# Patient Record
Sex: Male | Born: 1970 | Race: White | Hispanic: No | Smoking: Never smoker
Health system: Southern US, Community
[De-identification: ages and names within clinical notes are randomized; demographics above are authoritative.]

## PROBLEM LIST (undated history)

## (undated) DIAGNOSIS — F419 Anxiety disorder, unspecified: Secondary | ICD-10-CM

## (undated) DIAGNOSIS — I4891 Unspecified atrial fibrillation: Secondary | ICD-10-CM

## (undated) DIAGNOSIS — I10 Essential (primary) hypertension: Secondary | ICD-10-CM

## (undated) HISTORY — DX: Anxiety disorder, unspecified: F41.9

## (undated) HISTORY — PX: SHOULDER SURGERY: SHX246

---

## 1998-05-04 ENCOUNTER — Encounter: Payer: Self-pay | Admitting: Emergency Medicine

## 1998-05-04 ENCOUNTER — Emergency Department (HOSPITAL_COMMUNITY): Admission: EM | Admit: 1998-05-04 | Discharge: 1998-05-04 | Payer: Self-pay | Admitting: Emergency Medicine

## 1998-05-13 ENCOUNTER — Ambulatory Visit (HOSPITAL_COMMUNITY): Admission: RE | Admit: 1998-05-13 | Discharge: 1998-05-13 | Payer: Self-pay | Admitting: Orthopedic Surgery

## 1998-05-14 ENCOUNTER — Encounter: Payer: Self-pay | Admitting: Orthopedic Surgery

## 1998-07-05 ENCOUNTER — Encounter: Admission: RE | Admit: 1998-07-05 | Discharge: 1998-07-20 | Payer: Self-pay | Admitting: Orthopedic Surgery

## 1998-08-16 ENCOUNTER — Encounter: Admission: RE | Admit: 1998-08-16 | Discharge: 1998-10-18 | Payer: Self-pay | Admitting: Anesthesiology

## 1998-09-23 ENCOUNTER — Encounter: Admission: RE | Admit: 1998-09-23 | Discharge: 1998-10-11 | Payer: Self-pay | Admitting: Anesthesiology

## 1999-05-02 ENCOUNTER — Ambulatory Visit: Admission: RE | Admit: 1999-05-02 | Discharge: 1999-05-02 | Payer: Self-pay | Admitting: Occupational Medicine

## 1999-05-02 ENCOUNTER — Encounter: Payer: Self-pay | Admitting: Occupational Medicine

## 2000-12-24 ENCOUNTER — Encounter: Payer: Self-pay | Admitting: Emergency Medicine

## 2000-12-24 ENCOUNTER — Emergency Department (HOSPITAL_COMMUNITY): Admission: EM | Admit: 2000-12-24 | Discharge: 2000-12-24 | Payer: Self-pay | Admitting: Emergency Medicine

## 2001-12-04 ENCOUNTER — Encounter: Payer: Self-pay | Admitting: *Deleted

## 2001-12-04 ENCOUNTER — Emergency Department (HOSPITAL_COMMUNITY): Admission: EM | Admit: 2001-12-04 | Discharge: 2001-12-04 | Payer: Self-pay | Admitting: *Deleted

## 2003-11-16 ENCOUNTER — Encounter: Admission: RE | Admit: 2003-11-16 | Discharge: 2003-11-16 | Payer: Self-pay | Admitting: Family Medicine

## 2005-02-12 ENCOUNTER — Ambulatory Visit: Payer: Self-pay | Admitting: Internal Medicine

## 2010-02-03 ENCOUNTER — Encounter: Payer: Self-pay | Admitting: Gastroenterology

## 2010-03-04 ENCOUNTER — Encounter: Payer: Self-pay | Admitting: Gastroenterology

## 2010-03-06 ENCOUNTER — Encounter: Payer: Self-pay | Admitting: Gastroenterology

## 2010-03-09 ENCOUNTER — Encounter: Payer: Self-pay | Admitting: Gastroenterology

## 2010-04-18 DIAGNOSIS — F411 Generalized anxiety disorder: Secondary | ICD-10-CM | POA: Insufficient documentation

## 2010-04-18 DIAGNOSIS — R1013 Epigastric pain: Secondary | ICD-10-CM

## 2010-04-18 DIAGNOSIS — I10 Essential (primary) hypertension: Secondary | ICD-10-CM | POA: Insufficient documentation

## 2010-04-18 DIAGNOSIS — M25579 Pain in unspecified ankle and joints of unspecified foot: Secondary | ICD-10-CM

## 2010-04-25 ENCOUNTER — Ambulatory Visit: Payer: Self-pay | Admitting: Gastroenterology

## 2010-04-25 DIAGNOSIS — R1012 Left upper quadrant pain: Secondary | ICD-10-CM | POA: Insufficient documentation

## 2010-05-03 ENCOUNTER — Encounter (INDEPENDENT_AMBULATORY_CARE_PROVIDER_SITE_OTHER): Payer: Self-pay | Admitting: *Deleted

## 2010-05-03 ENCOUNTER — Ambulatory Visit: Payer: Self-pay | Admitting: Gastroenterology

## 2010-05-10 ENCOUNTER — Telehealth: Payer: Self-pay | Admitting: Gastroenterology

## 2010-05-29 ENCOUNTER — Encounter: Payer: Self-pay | Admitting: Gastroenterology

## 2010-08-06 ENCOUNTER — Encounter: Payer: Self-pay | Admitting: Family Medicine

## 2010-08-15 NOTE — Assessment & Plan Note (Signed)
Summary: ABD PAIN FOR SEVERAL MONTHS/YF   History of Present Illness Visit Type: Initial Consult Primary GI MD: Melvia Heaps MD [facility] Primary Provider: Louanna Raw, MD Requesting Provider: Louanna Raw, MD Chief Complaint: Patient c/o years LUQ abdominal discomfort which is now worsening. Pain is frequent and can last for days. He also c/o nausea but no vomiting. He does have occasional heartburn. History of Present Illness:   [name] is a 40 year old male referred at the request of Dr. Earlene Plater for evaluation of abdominal pain.  For years he has been complaining of intermittent left upper quadrant pain consisting of spontaneous pain in the subcostal area.  It is described as a sickening pain.  He has taken various PPIs without improvement.  He denies pyrosis.  Pain may interfere with falling asleep although he does not awaken from pain.   He is on no gastric irritants including nonsteroidals.   GI Review of Systems    Reports abdominal pain, belching, bloating, chest pain, heartburn, and  nausea.     Location of  Abdominal pain: RUQ.    Denies acid reflux, dysphagia with liquids, dysphagia with solids, loss of appetite, vomiting, vomiting blood, weight loss, and  weight gain.      Reports change in bowel habits and  diarrhea.     Denies anal fissure, black tarry stools, constipation, diverticulosis, fecal incontinence, heme positive stool, hemorrhoids, irritable bowel syndrome, jaundice, light color stool, liver problems, rectal bleeding, and  rectal pain. Preventive Screening-Counseling & Management  Alcohol-Tobacco     Smoking Status: never  Caffeine-Diet-Exercise     Does Patient Exercise: no      Drug Use:  no.      Current Medications (verified): 1)  Alprazolam 0.5 Mg Tabs (Alprazolam) .... Take 1 Tablet By Mouth Once Daily 2)  Vicodin 5-500 Mg Tabs (Hydrocodone-Acetaminophen) .... Take 1 Tablet By Mouth Once A Day 3)  Atenolol 25 Mg Tabs (Atenolol) .... Take 1 Tablet By  Mouth Once A Day  Allergies (verified): No Known Drug Allergies  Past History:  Past Medical History: Hypertension Anxiety  Past Surgical History: unremarkable  Family History: No FH of Colon Cancer: Father-Lung Cancer  Social History: Patient has never smoked.  Alcohol Use - no Illicit Drug Use - no Patient does not get regular exercise.  Smoking Status:  never Drug Use:  no Does Patient Exercise:  no  Review of Systems       The patient complains of allergy/sinus, anxiety-new, and shortness of breath.  The patient denies anemia, arthritis/joint pain, back pain, blood in urine, breast changes/lumps, change in vision, confusion, cough, coughing up blood, depression-new, fainting, fatigue, fever, headaches-new, hearing problems, heart murmur, heart rhythm changes, itching, menstrual pain, muscle pains/cramps, night sweats, nosebleeds, pregnancy symptoms, skin rash, sleeping problems, sore throat, swelling of feet/legs, swollen lymph glands, thirst - excessive , urination - excessive , urination changes/pain, urine leakage, vision changes, and voice change.         All other systems were reviewed and were negative   Vital Signs:  Patient profile:   40 year old male Height:      72 inches Weight:      273.50 pounds BMI:     37.23 BSA:     2.44 Pulse rate:   76 / minute Pulse rhythm:   regular BP sitting:   132 / 98  (right arm)  Vitals Entered By: Lamona Curl CMA Duncan Dull) (April 25, 2010 3:25 PM)  Physical Exam  Additional Exam:  On physical exam he is a well-developed male  skin: anicteric HEENT: normocephalic; PEERLA; no nasal or pharyngeal abnormalities neck: supple nodes: no cervical lymphadenopathy chest: clear to ausculatation and percussion heart: no murmurs, gallops, or rubs abd: soft, nontender; BS normoactive; no abdominal masses, tenderness, organomegaly rectal: deferred ext: no cynanosis, clubbing, edema skeletal: no deformities neuro:  oriented x 3; no focal abnormalities    Impression & Recommendations:  Problem # 1:  EPIGASTRIC PAIN (ICD-789.06) Symptoms could be due to ulcer or nonulcer dyspepsia.  Abdominal wall pain is also a consideration in view of the longevity of symptoms.  Absence of response to PPI therapy mitigates against overt peptic ulcer disease.  Recommendations #1 trial hyomax 0.375 mg b.i.d. p.r.n. #2 upper endoscopy  Risks, alternatives, and complications of the procedure, including bleeding, perforation, and possible need for surgery, were explained to the patient.  Patient's questions were answered.  Other Orders: EGD (EGD)  Patient Instructions: 1)  Copy sent to : Louanna Raw, MD 2)  Your EGD is scheduled for 05/03/2010 at 3:30pm 3)  The medication list was reviewed and reconciled.  All changed / newly prescribed medications were explained.  A complete medication list was provided to the patient / caregiver. Prescriptions: HYOMAX-SR 0.375 MG XR12H-TAB (HYOSCYAMINE SULFATE) takes one tab twice a day as needed for abdominal pain  #20 x 1   Entered and Authorized by:   Louis Meckel MD   Signed by:   Louis Meckel MD on 04/25/2010   Method used:   Electronically to        [facility]* (retail)       [address] [place], [place]  [address]       Ph: [phone]       Fax: [phone]   RxID:   [id number]

## 2010-08-15 NOTE — Progress Notes (Signed)
  Phone Note Call from Patient   Caller: Spouse Summary of Call: On call note. Pt is having a dry mouth and mild dizziness with hyoscyamine bid which was recently started. Advised to DC the medication or try 1/2 tablet two times a day instead of a full tablet. Contact Dr. Arlyce Dice for additional recommendations tomorrow. Initial call taken by: Meryl Dare MD Clementeen Graham,  May 10, 2010 8:05 PM

## 2010-08-15 NOTE — Letter (Signed)
Summary: [facility]   Imported By: Sherian Rein 05/03/2010 07:59:03  _____________________________________________________________________  External Attachment:    Type:   Image     Comment:   External Document

## 2010-08-15 NOTE — Letter (Signed)
Summary: Results Letter  [facility]  [address] [place], [place] [address]   Phone: [phone]  Fax: [phone]        April 25, 2010 MRN: [id number]    Joshua Simpson [address] [place], [place]  [address]    Dear Joshua Simpson,  It is my pleasure to have treated you recently as a new patient in my office. I appreciate your confidence and the opportunity to participate in your care.  Since I do have a busy inpatient endoscopy schedule and office schedule, my office hours vary weekly. I am, however, available for emergency calls everyday through my office. If I am not available for an urgent office appointment, another one of our gastroenterologist will be able to assist you.  My well-trained staff are prepared to help you at all times. For emergencies after office hours, a physician from our Gastroenterology section is always available through my 24 hour answering service  Once again I welcome you as a new patient and I look forward to a happy and healthy relationship            Sincerely,  Louis Meckel MD  This letter has been electronically signed by your physician.  Appended Document: Results Letter LETTER MAILED

## 2010-08-15 NOTE — Miscellaneous (Signed)
  Clinical Lists Changes  Medications: Removed medication of HYOMAX-SR 0.375 MG XR12H-TAB (HYOSCYAMINE SULFATE) take 1 tab two times a day as needed abdominal pain Added new medication of HYOMAX-SR 0.375 MG XR12H-TAB (HYOSCYAMINE SULFATE) take 1 tab two times a day as needed abdominal pain - Signed Rx of HYOMAX-SR 0.375 MG XR12H-TAB (HYOSCYAMINE SULFATE) take 1 tab two times a day as needed abdominal pain;  #15 x 1;  Signed;  Entered by: Louis Meckel MD;  Authorized by: Louis Meckel MD;  Method used: Print then Give to Patient    Prescriptions: HYOMAX-SR 0.375 MG XR12H-TAB (HYOSCYAMINE SULFATE) take 1 tab two times a day as needed abdominal pain  #15 x 1   Entered and Authorized by:   Louis Meckel MD   Signed by:   Louis Meckel MD on 05/03/2010   Method used:   Print then Give to Patient   RxID:   [id number]

## 2010-08-15 NOTE — Miscellaneous (Signed)
  Clinical Lists Changes Pt's pharmacy information is incorrect.  He has not taken hyomax.  Will represcribe .

## 2010-08-15 NOTE — Procedures (Signed)
Summary: Upper Endoscopy  Patient: Joshua Simpson Note: All result statuses are Final unless otherwise noted.  Tests: (1) Upper Endoscopy (EGD)   EGD Upper Endoscopy       DONE     [facility]     [address]     [place], [place]  [address]           ENDOSCOPY PROCEDURE REPORT           PATIENT:  Joshua Simpson, Joshua  MR#:  [id number]     BIRTHDATE:  [birth year], 39 yrs. old  GENDER:  male           ENDOSCOPIST:  Barbette Hair. Arlyce Dice, MD     Referred by:           PROCEDURE DATE:  05/03/2010     PROCEDURE:  EGD, diagnostic     ASA CLASS:  Class II     INDICATIONS:  abdominal pain           MEDICATIONS:   Fentanyl 100 mcg IV, Versed 10 mg IV, Benadryl 50     mg IV, glycopyrrolate (Robinal) 0.2 mg IV, 0.6cc simethancone 0.6     cc PO     TOPICAL ANESTHETIC:  Exactacain Spray           DESCRIPTION OF PROCEDURE:   After the risks benefits and     alternatives of the procedure were thoroughly explained, informed     consent was obtained.  The LB [id number]-H180 [id number] endoscope was     introduced through the mouth and advanced to the third portion of     the duodenum, without limitations.  The instrument was slowly     withdrawn as the mucosa was fully examined.     <<PROCEDUREIMAGES>>           normal otherwise (see image1, image4, image5, image6, image7, and     image8).    Retroflexed views revealed no abnormalities.    The     scope was then withdrawn from the patient and the procedure     completed.           COMPLICATIONS:  None           ENDOSCOPIC IMPRESSION:     1) Normal     RECOMMENDATIONS:Trial of hyomax; if no improvement, NSAIDS           REPEAT EXAM:  No           ______________________________     Barbette Hair. Arlyce Dice, MD           CC:  Louanna Raw, MD           n.     Rosalie Doctor:   Barbette Hair. Kamy Poinsett at 05/03/2010 04:13 PM           Pellow, Joshua, [id number]  Note: An exclamation mark (!) indicates a result that was not dispersed into the flowsheet. Document  Creation Date: 05/03/2010 4:13 PM _______________________________________________________________________  (1) Order result status: Final Collection or observation date-time: 05/03/2010 16:07 Requested date-time:  Receipt date-time:  Reported date-time:  Referring Physician:   Ordering Physician: Melvia Heaps [phone]) Specimen Source:  Source: Launa Grill Order Number: [id number] Lab site:

## 2010-08-15 NOTE — Letter (Signed)
Summary: [facility]  [facility]   Imported By: Sherian Rein 05/03/2010 08:02:07  _____________________________________________________________________  External Attachment:    Type:   Image     Comment:   External Document

## 2010-08-15 NOTE — Letter (Signed)
Summary: EGD Instructions  [facility]  [address] [place], [place] [address]   Phone: [phone]  Fax: [phone]       Joshua Simpson    [birth year]    MRN: [id number]       Procedure Day /Date:WEDNESDAY 05/03/2010     Arrival Time: 2:30PM     Procedure Time:3:30PM     Location of Procedure:                    X  [facility] (4th Floor)   PREPARATION FOR ENDOSCOPY   On10/19/2011 THE DAY OF THE PROCEDURE:  1.   No solid foods, milk or milk products are allowed after midnight the night before your procedure.  2.   Do not drink anything colored red or purple.  Avoid juices with pulp.  No orange juice.  3.  You may drink clear liquids until1:30PM, which is 2 hours before your procedure.                                                                                                CLEAR LIQUIDS INCLUDE: Water Jello Ice Popsicles Tea (sugar ok, no milk/cream) Powdered fruit flavored drinks Coffee (sugar ok, no milk/cream) Gatorade Juice: apple, white grape, white cranberry  Lemonade Clear bullion, consomm, broth Carbonated beverages (any kind) Strained chicken noodle soup Hard Candy   MEDICATION INSTRUCTIONS  Unless otherwise instructed, you should take regular prescription medications with a small sip of water as early as possible the morning of your procedure.           OTHER INSTRUCTIONS  You will need a responsible adult at least 39 years of age to accompany you and drive you home.   This person must remain in the waiting room during your procedure.  Wear loose fitting clothing that is easily removed.  Leave jewelry and other valuables at home.  However, you may wish to bring a book to read or an iPod/MP3 player to listen to music as you wait for your procedure to start.  Remove all body piercing jewelry and leave at home.  Total time from sign-in until discharge is approximately 2-3 hours.  You should go home directly  after your procedure and rest.  You can resume normal activities the day after your procedure.  The day of your procedure you should not:   Drive   Make legal decisions   Operate machinery   Drink alcohol   Return to work  You will receive specific instructions about eating, activities and medications before you leave.    The above instructions have been reviewed and explained to me by   _______________________    I fully understand and can verbalize these instructions _____________________________ Date _________

## 2010-08-15 NOTE — Miscellaneous (Signed)
  Clinical Lists Changes Pharmacy information is incorrect.  Will write for new prescription that will be printed.

## 2010-08-15 NOTE — Miscellaneous (Signed)
  Clinical Lists Changes  Medications: Removed medication of HYOMAX-SR 0.375 MG XR12H-TAB (HYOSCYAMINE SULFATE) takes one tab twice a day as needed for abdominal pain Added new medication of HYOMAX-SR 0.375 MG XR12H-TAB (HYOSCYAMINE SULFATE) take 1 tab two times a day as needed abdominal pain - Signed Rx of HYOMAX-SR 0.375 MG XR12H-TAB (HYOSCYAMINE SULFATE) take 1 tab two times a day as needed abdominal pain;  #15 x 1;  Signed;  Entered by: Karl Bales RN;  Authorized by: Karl Bales RN;  Method used: Print then Give to Patient    Prescriptions: HYOMAX-SR 0.375 MG XR12H-TAB (HYOSCYAMINE SULFATE) take 1 tab two times a day as needed abdominal pain  #15 x 1   Entered and Authorized by:   Karl Bales RN   Signed by:   Karl Bales RN on 05/03/2010   Method used:   Print then Give to Patient   RxID:   [id number]

## 2010-08-15 NOTE — Letter (Signed)
Summary: New Patient letter  [facility]  [address] [place], [place] [address]   Phone: [phone]  Fax: [phone]       03/09/2010 MRN: [id number]  Joshua Simpson [address] [place], [place]  [address]  Dear Joshua Simpson,  Welcome to the Gastroenterology Division at [facility].    You are scheduled to see Dr.  Barbette Hair. Joshua Simpson on 04-25-10 at 3:30pm on the 3rd floor at [facility], [address]  We ask that you try to arrive at our office 15 minutes prior to your appointment time to allow for check-in.  We would like you to complete the enclosed self-administered evaluation form prior to your visit and bring it with you on the day of your appointment.  We will review it with you.  Also, please bring a complete list of all your medications or, if you prefer, bring the medication bottles and we will list them.  Please bring your insurance card so that we may make a copy of it.  If your insurance requires a referral to see a specialist, please bring your referral form from your primary care physician.  Co-payments are due at the time of your visit and may be paid by cash, check or credit card.     Your office visit will consist of a consult with your physician (includes a physical exam), any laboratory testing he/she may order, scheduling of any necessary diagnostic testing (e.g. x-ray, ultrasound, CT-scan), and scheduling of a procedure (e.g. Endoscopy, Colonoscopy) if required.  Please allow enough time on your schedule to allow for any/all of these possibilities.    If you cannot keep your appointment, please call [phone] to cancel or reschedule prior to your appointment date.  This allows [place] the opportunity to schedule an appointment for another patient in need of care.  If you do not cancel or reschedule by 5 p.m. the business day prior to your appointment date, you will be charged a $50.00 late cancellation/no-show fee.    Thank you for  choosing [facility] for your medical needs.  We appreciate the opportunity to care for you.  Please visit [place] at our website  to learn more about our practice.                     Sincerely,                                                             The Gastroenterology Division

## 2010-10-18 ENCOUNTER — Emergency Department (HOSPITAL_COMMUNITY)
Admission: EM | Admit: 2010-10-18 | Discharge: 2010-10-18 | Disposition: A | Discharge status: Home / Self Care | Payer: BC Managed Care – PPO | Attending: Family Medicine | Admitting: Family Medicine

## 2010-10-18 ENCOUNTER — Emergency Department (HOSPITAL_COMMUNITY): Payer: BC Managed Care – PPO

## 2010-10-18 DIAGNOSIS — R0602 Shortness of breath: Secondary | ICD-10-CM | POA: Insufficient documentation

## 2010-10-18 DIAGNOSIS — R5381 Other malaise: Secondary | ICD-10-CM | POA: Insufficient documentation

## 2010-10-18 DIAGNOSIS — I10 Essential (primary) hypertension: Secondary | ICD-10-CM | POA: Insufficient documentation

## 2010-10-18 DIAGNOSIS — M25519 Pain in unspecified shoulder: Secondary | ICD-10-CM | POA: Insufficient documentation

## 2010-10-18 DIAGNOSIS — R0789 Other chest pain: Secondary | ICD-10-CM | POA: Insufficient documentation

## 2010-10-18 DIAGNOSIS — R61 Generalized hyperhidrosis: Secondary | ICD-10-CM | POA: Insufficient documentation

## 2010-10-18 LAB — POCT CARDIAC MARKERS
CKMB, poc: 1 ng/mL (ref 1.0–8.0)
CKMB, poc: <1 ng/mL — ABNORMAL LOW (ref 1.0–8.0)
Myoglobin, poc: 65.5 ng/mL (ref 12–200)
Myoglobin, poc: 64.3 ng/mL (ref 12–200)
Troponin i, poc: <0.05 ng/mL (ref 0.00–0.09)

## 2010-10-18 LAB — CBC
HCT: 47 % (ref 39.0–52.0)
Hemoglobin: 17.2 g/dL — ABNORMAL HIGH (ref 13.0–17.0)
MCV: 91.1 fL (ref 78.0–100.0)
RBC: 5.16 MIL/uL (ref 4.22–5.81)
RDW: 11.8 % (ref 11.5–15.5)
WBC: 5.1 10*3/uL (ref 4.0–10.5)

## 2010-10-18 LAB — BASIC METABOLIC PANEL
CO2: 26 mEq/L (ref 19–32)
Chloride: 106 mEq/L (ref 96–112)
Glucose, Bld: 121 mg/dL — ABNORMAL HIGH (ref 70–99)
Potassium: 3.9 mEq/L (ref 3.5–5.1)
Sodium: 139 mEq/L (ref 135–145)

## 2010-10-18 LAB — DIFFERENTIAL
Eosinophils Relative: 4 % (ref 0–5)
Lymphocytes Relative: 26 % (ref 12–46)
Lymphs Abs: 1.3 10*3/uL (ref 0.7–4.0)
Neutro Abs: 3.1 10*3/uL (ref 1.7–7.7)

## 2014-01-31 ENCOUNTER — Emergency Department (HOSPITAL_COMMUNITY)
Admission: EM | Admit: 2014-01-31 | Discharge: 2014-01-31 | Disposition: A | Discharge status: Home / Self Care | Payer: BC Managed Care – PPO | Attending: Emergency Medicine | Admitting: Emergency Medicine

## 2014-01-31 ENCOUNTER — Encounter (HOSPITAL_COMMUNITY): Payer: Self-pay | Admitting: Emergency Medicine

## 2014-01-31 DIAGNOSIS — T22139A Burn of first degree of unspecified upper arm, initial encounter: Secondary | ICD-10-CM | POA: Insufficient documentation

## 2014-01-31 DIAGNOSIS — Z79899 Other long term (current) drug therapy: Secondary | ICD-10-CM | POA: Insufficient documentation

## 2014-01-31 DIAGNOSIS — I10 Essential (primary) hypertension: Secondary | ICD-10-CM | POA: Insufficient documentation

## 2014-01-31 DIAGNOSIS — X19XXXA Contact with other heat and hot substances, initial encounter: Secondary | ICD-10-CM | POA: Insufficient documentation

## 2014-01-31 DIAGNOSIS — T300 Burn of unspecified body region, unspecified degree: Secondary | ICD-10-CM

## 2014-01-31 DIAGNOSIS — Y929 Unspecified place or not applicable: Secondary | ICD-10-CM | POA: Insufficient documentation

## 2014-01-31 DIAGNOSIS — T2010XA Burn of first degree of head, face, and neck, unspecified site, initial encounter: Secondary | ICD-10-CM | POA: Insufficient documentation

## 2014-01-31 DIAGNOSIS — Y9389 Activity, other specified: Secondary | ICD-10-CM | POA: Insufficient documentation

## 2014-01-31 HISTORY — DX: Essential (primary) hypertension: I10

## 2014-01-31 MED ORDER — OXYCODONE-ACETAMINOPHEN 5-325 MG PO TABS: 1.0000 | ORAL_TABLET | ORAL | Status: DC | PRN

## 2014-01-31 MED ORDER — ONDANSETRON HCL 4 MG/2ML IJ SOLN
4.0000 mg | Freq: Once | INTRAMUSCULAR | Status: AC
  Administered 2014-01-31: 4 mg via INTRAVENOUS
  Filled 2014-01-31: qty 2

## 2014-01-31 MED ORDER — HYDROMORPHONE HCL PF 1 MG/ML IJ SOLN
1.0000 mg | Freq: Once | INTRAMUSCULAR | Status: AC
  Administered 2014-01-31: 1 mg via INTRAVENOUS
  Filled 2014-01-31: qty 1

## 2014-01-31 MED ORDER — KETOROLAC TROMETHAMINE 30 MG/ML IJ SOLN
30.0000 mg | Freq: Once | INTRAMUSCULAR | Status: AC
  Administered 2014-01-31: 30 mg via INTRAVENOUS
  Filled 2014-01-31: qty 1

## 2014-01-31 NOTE — ED Notes (Addendum)
Pt reports he is supposed to take blood pressure medication but does not take it every day and has not took it today.  MD Pollina made aware.
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
## 2014-01-31 NOTE — Discharge Instructions (Signed)
Burn Care Your skin is a natural barrier to infection. It is the largest organ of your body. Burns damage this natural protection. To help prevent infection, it is very important to follow your caregiver's instructions in the care of your burn. Burns are classified as:  First degree. There is only redness of the skin (erythema). No scarring is expected.  Second degree. There is blistering of the skin. Scarring may occur with deeper burns.  Third degree. All layers of the skin are injured, and scarring is expected. HOME CARE INSTRUCTIONS   Wash your hands well before changing your bandage.  Change your bandage as often as directed by your caregiver.  Remove the old bandage. If the bandage sticks, you may soak it off with cool, clean water.  Cleanse the burn thoroughly but gently with mild soap and water.  Pat the area dry with a clean, dry cloth.  Apply a thin layer of antibacterial cream to the burn.  Apply a clean bandage as instructed by your caregiver.  Keep the bandage as clean and dry as possible.  Elevate the affected area for the first 24 hours, then as instructed by your caregiver.  Only take over-the-counter or prescription medicines for pain, discomfort, or fever as directed by your caregiver. SEEK IMMEDIATE MEDICAL CARE IF:   You develop excessive pain.  You develop redness, tenderness, swelling, or red streaks near the burn.  The burned area develops yellowish-white fluid (pus) or a bad smell.  You have a fever. MAKE SURE YOU:   Understand these instructions.  Will watch your condition.  Will get help right away if you are not doing well or get worse. Document Released: 07/02/2005 Document Revised: 09/24/2011 Document Reviewed: 11/22/2010 ExitCare Patient Information 2015 [employer]. This information is not intended to replace advice given to you by your health care provider. Make sure you discuss any questions you have with your health care  provider.  

## 2014-01-31 NOTE — ED Notes (Signed)
MD at bedside for evaluation

## 2014-01-31 NOTE — ED Notes (Signed)
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  Pt arrived to the ED with ice applied to face.  Pt denies any difficulty with breathing.  Breath sounds clear.  Pt talking and able to answer questions.  Pt has redness noted to face and arms.

## 2015-06-02 ENCOUNTER — Encounter: Payer: Self-pay | Admitting: Gastroenterology

## 2015-07-20 ENCOUNTER — Telehealth: Payer: Self-pay | Admitting: Cardiovascular Disease

## 2015-07-20 NOTE — Telephone Encounter (Signed)
Joshua Simpson is calling because he has not been feeling well lately , bp is 144/110 and pulse is 113. Just took his bp medication because he normally takes around 3:30pm everyday. Also having some chest pains . Please call   Thanks

## 2015-07-20 NOTE — Telephone Encounter (Signed)
Pt who was remotely seen by Dr. Tresa EndoKelly and Dr. Royann Shiversroitoru (no office visit chart notes on file, this is per pt recollection) explains his history of anxiety, palpitations. He has had episodes of palpitations on and off - 2-3 events over past week. He explains that today around 3pm he had been exerting himself by working on pulling down [employer] lights, he felt a little unwell and came inside. He took atenolol which he usually takes in AM - has been taking in afternoon for past week - and reports he then took BP reading. BP was noted below.   He states episode of CP that seems to be reprodudible - advised if he can rub or press it and obtains relief, likely MSK but if in doubt he should present to ED. Also recommended if CP w/ jaw, neck, arm pain, SOB, or syncope/near syncope to go to ED.  Pt admits to hx of anxiety and PRN xanax use - notes he obtains relief w/ his palpitations in this manner. Advised PRN use of xanax today & not if improvement. Did not recommend extra atenolol at this time - recommended daily log of BPs and return to taking atenolol in AM. Pt voiced understanding.  Set patient up for return visit w/ Dr. Tresa EndoKelly. Advised to call in interim if new concerns.

## 2015-08-11 ENCOUNTER — Ambulatory Visit (INDEPENDENT_AMBULATORY_CARE_PROVIDER_SITE_OTHER): Payer: Self-pay | Admitting: Cardiovascular Disease

## 2015-08-11 ENCOUNTER — Encounter: Payer: Self-pay | Admitting: Cardiovascular Disease

## 2015-08-11 VITALS — BP 150/110 | HR 67 | Ht 73.0 in | Wt 279.0 lb

## 2015-08-11 DIAGNOSIS — R002 Palpitations: Secondary | ICD-10-CM

## 2015-08-11 DIAGNOSIS — F419 Anxiety disorder, unspecified: Secondary | ICD-10-CM

## 2015-08-11 DIAGNOSIS — E669 Obesity, unspecified: Secondary | ICD-10-CM

## 2015-08-11 DIAGNOSIS — I10 Essential (primary) hypertension: Secondary | ICD-10-CM

## 2015-08-11 DIAGNOSIS — G478 Other sleep disorders: Secondary | ICD-10-CM

## 2015-08-11 MED ORDER — ALPRAZOLAM 0.25 MG PO TABS: 0.2500 mg | ORAL_TABLET | Freq: Two times a day (BID) | ORAL | Status: DC | PRN

## 2015-08-11 MED ORDER — LISINOPRIL 10 MG PO TABS: ORAL_TABLET | ORAL | Status: DC

## 2015-08-11 MED ORDER — ATENOLOL 25 MG PO TABS: 25.0000 mg | ORAL_TABLET | Freq: Every morning | ORAL | Status: DC

## 2015-08-11 NOTE — Patient Instructions (Addendum)
Your physician has recommended you make the following change in your medication:   1.) The atenolol has been increased to 1 tablet in the morning and 1/2 tablet at night.  2.) start new prescription for lisinopril. Start out with 1/2 tablet daily until you return for your follow up appointment.  Your physician has requested that you have an echocardiogram. Echocardiography is a painless test that uses sound waves to create images of your heart. It provides your doctor with information about the size and shape of your heart and how well your heart's chambers and valves are working. This procedure takes approximately one hour. There are no restrictions for this procedure.  Your physician recommends that you return for lab work fasting.  Your physician recommends that you schedule a follow-up appointment in: 2 months with Dr Tresa Endo  Your physician has recommended that you have a sleep study. This test records several body functions during sleep, including: brain activity, eye movement, oxygen and carbon dioxide blood levels, heart rate and rhythm, breathing rate and rhythm, the flow of air through your mouth and nose, snoring, body muscle movements, and chest and belly movement.

## 2015-08-15 ENCOUNTER — Encounter: Payer: Self-pay | Admitting: Cardiovascular Disease

## 2015-08-15 DIAGNOSIS — E669 Obesity, unspecified: Secondary | ICD-10-CM | POA: Insufficient documentation

## 2015-08-15 DIAGNOSIS — R002 Palpitations: Secondary | ICD-10-CM | POA: Insufficient documentation

## 2015-08-15 NOTE — Progress Notes (Signed)
Patient ID: Joshua Simpson, male   DOB: 1970-11-13, 45 y.o.   MRN: [id number]     Primary: Mauricio Po, NP  PATIENT PROFILE: Joshua Simpson is a 45 y.o. male who presents for cardiology evaluation with a chief complaint of shortness of breath and palpitations.   HPI:  Joshua Simpson has a history of hypertension and in 2011 experience atypical chest pain.  He was evaluated by Dr. Royann Shivers initially in  2011.  His chest pain was felt to be atypical and not exertionally related and most likely musculoskeletal.  It was felt that he had anxiety driven, sinus tachycardia and significant cardiac deconditioning.  His last cardiac evaluation was 5 years ago by Dr. Herbie Baltimore.  The patient has issues with anxiety.  Over the last 3 weeks.  He is experiencing increased episodes of shortness of breath.  He denies chest pain.  He does have hypertension.  He has been awakened from sleep, particularly around 5 AM with his heart pounding.  He admits to very poor sleep.  His sleep is nonrestorative.  He does note some daytime sleepiness.  He snores and at times has awakened gasping for breath.  He has taken Xanax in the past for anxiety.  He has GERD for which he has taken pantoprazole.  He had recently been out of work after surgery which her surgery for [employer].  Because of his increasing symptoms associated with increased blood pressure, he is now referred for cardiology evaluation.  Past Medical History  Diagnosis Date  . Hypertension     No past surgical history on file.  No Known Allergies  Current Outpatient Prescriptions  Medication Sig Dispense Refill  . ALPRAZolam (XANAX) 0.25 MG tablet Take 1 tablet (0.25 mg total) by mouth 2 (two) times daily as needed for anxiety. 20 tablet o  . atenolol (TENORMIN) 25 MG tablet Take 1 tablet (25 mg total) by mouth every morning. Take 1/2 tablet in the PM 45 tablet 6  . cetirizine (ZYRTEC) 10 MG tablet Take 10 mg by mouth daily.    Marland Kitchen lisinopril  (PRINIVIL,ZESTRIL) 10 MG tablet Take 1/2-1 tablets daily as directed 30 tablet 6  . Multiple Vitamin (MULTIVITAMIN WITH MINERALS) TABS tablet Take 1 tablet by mouth daily.    . pantoprazole (PROTONIX) 40 MG tablet Take 40 mg by mouth as needed (GERD).     No current facility-administered medications for this visit.    Social History   Social History  . Marital Status: Single    Spouse Name: N/A  . Number of Children: N/A  . Years of Education: N/A   Occupational History  . Not on file.   Social History Main Topics  . Smoking status: Never Smoker   . Smokeless tobacco: Never Used  . Alcohol Use: No     Comment: occassionally  . Drug Use: No  . Sexual Activity: Not on file   Other Topics Concern  . Not on file   Social History Narrative   Social history is notable that he is married for 18 years.  He has one child.  He works at the [employer].  He completed 11th grade of education.  There is no history of tobacco use.  He does not routinely exercise but does walk occasionally.  Family History  Problem Relation Age of Onset  . Cancer Father    Family history is notable that both parents are living at age 47.  Mother has issues with anxiety.  Father had cancer  of his kidney.  He is one brother age 26 and 1 sister, age 52.  His child is age 41.  ROS General: Negative; No fevers, chills, or night sweats HEENT: Negative; No changes in vision or hearing, sinus congestion, difficulty swallowing Pulmonary: Negative; No cough, wheezing, shortness of breath, hemoptysis Cardiovascular:  See HPI;  GI: Negative; No nausea, vomiting, diarrhea, or abdominal pain GU: Negative; No dysuria, hematuria, or difficulty voiding Musculoskeletal: Recent injury to his shoulder requiring surgery on his rotator cuff Hematologic/Oncologic: Negative; no easy bruising, bleeding Endocrine: Negative; no heat/cold intolerance; no diabetes Neuro: Negative; no changes in balance,  headaches Skin: Negative; No rashes or skin lesions Psychiatric: Positive for anxiety Sleep: Negative; No daytime sleepiness, hypersomnolence, bruxism, restless legs, hypnogagnic hallucinations Other comprehensive 14 point system review is negative   Physical Exam BP 150/110 mmHg  Pulse 67  Ht  (1.854 m)  Wt 279 lb (126.554 kg)  BMI 36.82 kg/m2  Wt Readings from Last 3 Encounters:  08/11/15 279 lb (126.554 kg)  01/31/14 240 lb (108.863 kg)  04/25/10 273 lb 8 oz (124.059 kg)   General: Alert, oriented, no distress.  Moderately obese Skin: normal turgor, no rashes, warm and dry HEENT: Normocephalic, atraumatic. Pupils equal round and reactive to light; sclera anicteric; extraocular muscles intact; Fundi without hemorrhages or exudates Nose without nasal septal hypertrophy Mouth/Parynx benign; Mallinpatti scale 3 Neck: No JVD, no carotid bruits; normal carotid upstroke Lungs: clear to ausculatation and percussion; no wheezing or rales Chest wall: without tenderness to palpitation Heart: PMI not displaced, RRR, s1 s2 normal, 1/6 systolic murmur, no diastolic murmur, no rubs, gallops, thrills, or heaves Abdomen: soft, nontender; no hepatosplenomehaly, BS+; abdominal aorta nontender and not dilated by palpation. Back: no CVA tenderness Pulses 2+ Musculoskeletal: full range of motion, normal strength, no joint deformities Extremities: no clubbing cyanosis or edema, Homan's sign negative  Neurologic: grossly nonfocal; Cranial nerves grossly wnl Psychologic: Normal mood and affect   ECG (independently read by me): Normal sinus rhythm at 67 bpm.  QTc interval 452 ms.  No significant ST-T changes  LABS:  BMP Latest Ref Rng 10/18/2010  Glucose 70 - 99 mg/dL 161(W)  BUN 6 - 23 mg/dL 8  Creatinine 0.4 - 1.5 mg/dL 9.60  Sodium 454 - 098 mEq/L 139  Potassium 3.5 - 5.1 mEq/L 3.9  Chloride 96 - 112 mEq/L 106  CO2 19 - 32 mEq/L 26  Calcium 8.4 - 10.5 mg/dL 9.5    No flowsheet data  found.  CBC Latest Ref Rng 10/18/2010  WBC 4.0 - 10.5 K/uL 5.1  Hemoglobin 13.0 - 17.0 g/dL 17.2(H)  Hematocrit 39.0 - 52.0 % 47.0  Platelets 150 - 400 K/uL 179   Lab Results  Component Value Date   MCV 91.1 10/18/2010   No results found for: TSH No results found for: HGBA1C   BNP No results found for: BNP  ProBNP No results found for: PROBNP   Lipid Panel  No results found for: CHOL, TRIG, HDL, CHOLHDL, VLDL, LDLCALC, LDLDIRECT  RADIOLOGY: No results found.   ASSESSMENT AND PLAN: [name] is a 45 year old gentleman who has a history of obesity, as well as hypertension and anxiety.  He recently has noticed some episodes of increasing shortness of breath and palpitations.  He also has been awakened from sleep with his heart pounding and notes significant snoring as well as awakening gasping for breath.  ECG today shows sinus rhythm at 67 bpm.  Remotely he had issues  with atypical chest pain.  I am recommending that laboratory be obtained in the fasting state.  He has been on atenolol and I am recommending that he increase this to 25 mg in the morning and 12.5 mg in the evening.  He is hypertensive today with an initial blood pressure 150/110.  Repeat blood pressure by me was 144/102.  I am also adding lisinopril initially at 5 mg.  I am scheduling him for an echo Doppler study to assess both systolic and diastolic function.  I am concerned that he may very well have obstructive sleep apnea which may contributory to his nocturnal early morning, palpitations, particularly most likely when he is trying to get into REM sleep and sleep apnea may be most severe.  I have given him Xanax 0.25 mg #20 with no refills to help with his anxiety, which may be contributing to his tachycardia dysrhythmias.  I will see him in 2 months for cardiology reevaluation   Lennette Bihari, MD, [facility] 08/15/2015 6:45 PM

## 2015-08-22 ENCOUNTER — Telehealth: Payer: Self-pay

## 2015-08-22 ENCOUNTER — Telehealth: Payer: Self-pay | Admitting: Cardiovascular Disease

## 2015-08-22 NOTE — Telephone Encounter (Signed)
New message    Patient calling   1.  blood work unable to get is done.   2. Echo results.

## 2015-08-22 NOTE — Telephone Encounter (Signed)
LEFT MESSAGE ON PATIENT VOICEMAIL TO RETURN CALL. I  NEED TO KNOW WHAT INSURANCE PATIENT HAS SO I CAN UP DATE THE SYSTEM OR IS PATIENT SELF PAY ?

## 2015-08-22 NOTE — Telephone Encounter (Signed)
PATIENT RETURNED CALL AT THIS TIME HE DOES NOT  HAVE ANY INSURANCE

## 2015-08-25 ENCOUNTER — Other Ambulatory Visit (HOSPITAL_COMMUNITY): Payer: Self-pay

## 2015-09-08 ENCOUNTER — Other Ambulatory Visit (HOSPITAL_COMMUNITY): Payer: Self-pay

## 2015-09-21 ENCOUNTER — Other Ambulatory Visit: Payer: Self-pay

## 2015-09-21 ENCOUNTER — Ambulatory Visit (HOSPITAL_COMMUNITY): Payer: Self-pay | Attending: Cardiovascular Disease

## 2015-09-21 DIAGNOSIS — I340 Nonrheumatic mitral (valve) insufficiency: Secondary | ICD-10-CM | POA: Insufficient documentation

## 2015-09-21 DIAGNOSIS — I77810 Thoracic aortic ectasia: Secondary | ICD-10-CM | POA: Insufficient documentation

## 2015-09-21 DIAGNOSIS — I10 Essential (primary) hypertension: Secondary | ICD-10-CM

## 2015-09-21 DIAGNOSIS — I119 Hypertensive heart disease without heart failure: Secondary | ICD-10-CM | POA: Insufficient documentation

## 2015-09-21 LAB — ECHOCARDIOGRAM COMPLETE
AOVTI: 19.1 cm
E decel time: 356 msec
FS: 35 % (ref 28–44)
IVS/LV PW RATIO, ED: 0.85
LEFT ATRIUM END SYS DIAM: 51 cm
LV PW d: 15.7 mm — AB (ref 0.6–1.1)
LVOT area: 5.31 cm2
LVOTPV: 85.7 m/s
MVPKAVEL: 50 m/s
MVPKEVEL: 65 m/s
Stroke v: 101 ml

## 2015-09-30 ENCOUNTER — Encounter: Payer: Self-pay | Admitting: *Deleted

## 2015-10-04 ENCOUNTER — Encounter (HOSPITAL_BASED_OUTPATIENT_CLINIC_OR_DEPARTMENT_OTHER): Payer: Self-pay

## 2015-10-14 ENCOUNTER — Emergency Department (HOSPITAL_BASED_OUTPATIENT_CLINIC_OR_DEPARTMENT_OTHER)
Admission: EM | Admit: 2015-10-14 | Discharge: 2015-10-14 | Disposition: A | Discharge status: Home / Self Care | Payer: Self-pay | Attending: Emergency Medicine | Admitting: Emergency Medicine

## 2015-10-14 ENCOUNTER — Encounter (HOSPITAL_BASED_OUTPATIENT_CLINIC_OR_DEPARTMENT_OTHER): Payer: Self-pay | Admitting: *Deleted

## 2015-10-14 DIAGNOSIS — Z79899 Other long term (current) drug therapy: Secondary | ICD-10-CM | POA: Insufficient documentation

## 2015-10-14 DIAGNOSIS — L259 Unspecified contact dermatitis, unspecified cause: Secondary | ICD-10-CM | POA: Insufficient documentation

## 2015-10-14 DIAGNOSIS — I10 Essential (primary) hypertension: Secondary | ICD-10-CM | POA: Insufficient documentation

## 2015-10-14 MED ORDER — DIPHENHYDRAMINE HCL 25 MG PO CAPS
25.0000 mg | ORAL_CAPSULE | Freq: Once | ORAL | Status: AC
  Administered 2015-10-14: 25 mg via ORAL
  Filled 2015-10-14: qty 1

## 2015-10-14 MED ORDER — FAMOTIDINE 20 MG PO TABS
20.0000 mg | ORAL_TABLET | Freq: Once | ORAL | Status: AC
  Administered 2015-10-14: 20 mg via ORAL
  Filled 2015-10-14: qty 1

## 2015-10-14 MED ORDER — PREDNISONE 10 MG PO TABS
60.0000 mg | ORAL_TABLET | Freq: Once | ORAL | Status: AC
  Administered 2015-10-14: 60 mg via ORAL
  Filled 2015-10-14: qty 1

## 2015-10-14 MED ORDER — PREDNISONE 20 MG PO TABS: 40.0000 mg | ORAL_TABLET | Freq: Every day | ORAL | Status: DC

## 2015-10-14 NOTE — ED Notes (Signed)
Pt made aware to return if symptoms worsen or if any life threatening symptoms occur.   

## 2015-10-14 NOTE — ED Provider Notes (Signed)
CSN: [id number]     Arrival date & time 10/14/15  1320 History   First MD Initiated Contact with Patient 10/14/15 1340     Chief Complaint  Patient presents with  . Rash     (Consider location/radiation/quality/duration/timing/severity/associated sxs/prior Treatment) Patient is a 45 y.o. male presenting with rash. The history is provided by the patient and medical records. No language interpreter was used.  Rash Associated symptoms: no abdominal pain, no fever, no headaches, no nausea, no shortness of breath, not vomiting and not wheezing    Joshua Simpson is a 45 y.o. male  with a PMH of HTN who presents to the Emergency Department complaining of worsening generalized rash that occurred two days ago. Patient states he applied an OTC topical pain reliever to his left shoulder 2 days ago. He did not wash his hands after application. Approximately 30 minutes later, and she rash developed over his left shoulder as well as his bilateral legs, chest, and abdomen. No other changes in foods, lotions, detergents, soaps. Patient took Benadryl with relief of itching, however rash has progressively spread. Denies shortness of breath, difficulty swallowing, neck swelling, oral swelling.  Past Medical History  Diagnosis Date  . Hypertension    Past Surgical History  Procedure Laterality Date  . Shoulder surgery     Family History  Problem Relation Age of Onset  . Cancer Father    Social History  Substance Use Topics  . Smoking status: Never Smoker   . Smokeless tobacco: Never Used  . Alcohol Use: No     Comment: occassionally    Review of Systems  Constitutional: Negative for fever and chills.  HENT: Negative for congestion.   Eyes: Negative for visual disturbance.  Respiratory: Negative for cough, shortness of breath and wheezing.   Cardiovascular: Negative.   Gastrointestinal: Negative for nausea, vomiting and abdominal pain.  Genitourinary: Negative for dysuria.  Musculoskeletal:  Negative for back pain and neck pain.  Skin: Positive for rash.  Neurological: Negative for dizziness and headaches.      Allergies  Review of patient's allergies indicates no known allergies.  Home Medications   Prior to Admission medications   Medication Sig Start Date End Date Taking? Authorizing Provider  ALPRAZolam (XANAX) 0.25 MG tablet Take 1 tablet (0.25 mg total) by mouth 2 (two) times daily as needed for anxiety. 08/11/15   Lennette Biharihomas A Kelly, MD  atenolol (TENORMIN) 25 MG tablet Take 1 tablet (25 mg total) by mouth every morning. Take 1/2 tablet in the PM 08/11/15   Lennette Biharihomas A Kelly, MD  cetirizine (ZYRTEC) 10 MG tablet Take 10 mg by mouth daily.    Historical Provider, MD  lisinopril (PRINIVIL,ZESTRIL) 10 MG tablet Take 1/2-1 tablets daily as directed 08/11/15   Lennette Biharihomas A Kelly, MD  Multiple Vitamin (MULTIVITAMIN WITH MINERALS) TABS tablet Take 1 tablet by mouth daily.    Historical Provider, MD  pantoprazole (PROTONIX) 40 MG tablet Take 40 mg by mouth as needed (GERD).    Historical Provider, MD  predniSONE (DELTASONE) 20 MG tablet Take 2 tablets (40 mg total) by mouth daily. 10/14/15   Jaime Pilcher Ward, PA-C   BP 132/95 mmHg  Pulse 78  Temp(Src) 99 F (37.2 C) (Oral)  Resp 18  Ht 6' (1.829 m)  Wt 113.399 kg  BMI 33.90 kg/m2  SpO2 97% Physical Exam  Constitutional: He is oriented to person, place, and time. He appears well-developed and well-nourished.  Alert and in no acute distress  HENT:  Head: Normocephalic and atraumatic.  Airway patent. Oropharynx clear. Mucous membranes moist.  Neck: Normal range of motion. Neck supple. No tracheal deviation present.  No swelling or tenderness to palpation.  Cardiovascular: Normal rate, regular rhythm and normal heart sounds.  Exam reveals no gallop and no friction rub.   No murmur heard. Pulmonary/Chest: Effort normal and breath sounds normal. No respiratory distress. He has no wheezes. He has no rales.  Abdominal: Soft. He  exhibits no distension. There is no tenderness.  Musculoskeletal: Normal range of motion.  Neurological: He is alert and oriented to person, place, and time.  Skin: Skin is warm and dry.  Pruritic maculopapular rash on chest, lower abdomen, bilateral lower extremities, left shoulder.  Nursing note and vitals reviewed.   ED Course  Procedures (including critical care time) Labs Review Labs Reviewed - No data to display  Imaging Review No results found. I have personally reviewed and evaluated these images and lab results as part of my medical decision-making.   EKG Interpretation None      MDM   Final diagnoses:  Contact dermatitis   Joshua Simpson presents for pruritic maculopapular rash which developed approx. 30 minutes after applying a topical analgesic cream. On exam, airway is patent. Patient denies sob, oral swelling, neck swelling. Treated with benadryl, pepcid, and prednisone in ED. Will give rx for steroid burst and encouraged PCP follow up. Benadryl and pepcid PRN itching. Strict return precautions given. All questions answered.   [facility] Ward, PA-C 10/14/15 1454  Vanetta Mulders, MD 10/14/15 [id number]

## 2015-10-14 NOTE — Discharge Instructions (Signed)
Read instructions below to learn more about your diagnosis and for reasons to return to the ED. Take prednisone as directed. You can use Benadryl and Pepcid for itching. Follow up with your doctor if symptoms are not improving after 3-4 days. You may return to the emergency department if symptoms worsen, become progressive, or become more concerning.  Allergic Reaction  There are many allergens around us. It may be difficult to know what caused your reaction. You may follow up with an allergy specialist for further testing to learn more about your specific allergies.   TREATMENT AND HOME CARE INSTRUCTIONS  If hives or rash are present:  Use an over-the-counter antihistamine (Benadryl or Zyrtec) for hives and itching as needed. Do not drive or drink alcohol until medications used to treat the reaction have worn off. Antihistamines tend to make people sleepy.  Apply cold cloths (compresses) to the skin or take baths in cool water. This will help itching. Avoid hot baths or showers. Heat will make a rash and itching worse.  See Your Primary Care Doctor if:  Your allergies are becoming progressively more troublesome.  You suspect a food allergy. Symptoms generally happen within 30 minutes of eating a food.  Your symptoms have not gone away within 2 days.  SEEK IMMEDIATE MEDICAL CARE IF:  You develop difficulty breathing or wheezing, or have a tight feeling in your chest or throat (feeling like your throat is closing) You develop swollen lips or tongue You develop hives on your chest, neck or face. You are unable to swallow fluids or salvia secretions.   A severe reaction with any of the above problems should be considered life-threatening. If you suddenly develop difficulty breathing call for local emergency medical help. THIS IS AN EMERGENCY.

## 2015-10-14 NOTE — ED Notes (Signed)
Pt reports that he put some back pain lotion on.  States that he has had a rash on bilateral legs and backs since that time.

## 2015-10-28 ENCOUNTER — Encounter: Payer: Self-pay | Admitting: *Deleted

## 2015-10-31 ENCOUNTER — Encounter: Payer: Self-pay | Admitting: Cardiovascular Disease

## 2015-10-31 ENCOUNTER — Ambulatory Visit (INDEPENDENT_AMBULATORY_CARE_PROVIDER_SITE_OTHER): Payer: Self-pay | Admitting: Cardiovascular Disease

## 2015-10-31 VITALS — BP 130/80 | HR 63 | Ht 72.0 in | Wt 272.0 lb

## 2015-10-31 DIAGNOSIS — F411 Generalized anxiety disorder: Secondary | ICD-10-CM

## 2015-10-31 DIAGNOSIS — I10 Essential (primary) hypertension: Secondary | ICD-10-CM

## 2015-10-31 DIAGNOSIS — R002 Palpitations: Secondary | ICD-10-CM

## 2015-10-31 DIAGNOSIS — E669 Obesity, unspecified: Secondary | ICD-10-CM

## 2015-10-31 MED ORDER — ALPRAZOLAM 0.25 MG PO TABS: 0.2500 mg | ORAL_TABLET | Freq: Two times a day (BID) | ORAL | Status: DC | PRN

## 2015-10-31 NOTE — Patient Instructions (Signed)
Your physician wants you to follow-up in: 1 year or sooner if needed. You will receive a reminder letter in the mail two months in advance. If you don't receive a letter, please call our office to schedule the follow-up appointment.  If you need a refill on your cardiac medications before your next appointment, please call your pharmacy.  Any further refills on your Alprazolam will need to come from your primary care doctor.

## 2015-10-31 NOTE — Progress Notes (Signed)
Patient ID: Joshua Simpson, male   DOB: [birth year], 45 y.o.   MRN: [id number]     Primary: Mauricio Po, NP  PATIENT PROFILE: Joshua Simpson is a 45 y.o. male who was referred for cardiology evaluation with a chief complaint of shortness of breath and palpitations.  I saw for initial evaluation on 08/11/2015.  He presents for follow-up evaluation.   HPI:  Joshua Simpson has a history of hypertension and in 2011 experience atypical chest pain.  He was evaluated by Dr. Royann Shivers initially in  2011.  His chest pain was felt to be atypical and not exertionally related and most likely musculoskeletal.  It was felt that he had anxiety driven, sinus tachycardia and significant cardiac deconditioning.  His last cardiac evaluation was 5 years ago by Dr. Herbie Baltimore.  The patient has issues with anxiety.  Over the last 3 weeks.  He is experiencing increased episodes of shortness of breath.  He denies chest pain.  He does have hypertension.  He has been awakened from sleep, particularly around 5 AM with his heart pounding.  He admits to very poor sleep.  His sleep is nonrestorative.  He does note some daytime sleepiness.  He snores and at times has awakened gasping for breath.  He has taken Xanax in the past for anxiety.  He has GERD for which he has taken pantoprazole.  He had recently been out of work after surgery which her surgery for [employer]  Because of his increasing symptoms associated with increased blood pressure, he was referred for cardiology evaluation.  When I saw him initially he had significant hypertension and his blood pressure was 150/110.  He had been on low-dose atenolol and I increased this to 25 mg in the morning and 12.5 mg in the evening.  I also started him on lisinopril at 5 mg daily.  He underwent an echo Doppler study which was done on 09/21/2015.  This showed an ejection fraction of 60-65% without wall motion abnormalities.  There was grade 2 diastolic dysfunction.  His aortic root was  upper normal at 37 mm.  There was trivial MR.  Mr. Detwiler has been taking his blood pressure at home and the assist now stabilized and typically runs in the 120 systolic range and in the 80s in the diastolic range.  He denies chest pain.  He does have anxiety issues and has taking alprazolam intermittently.  He has history of GERD for which he has been on pantoprazole.  He presents for follow up evaluation.  Past Medical History  Diagnosis Date  . Hypertension   . Anxiety     Past Surgical History  Procedure Laterality Date  . Shoulder surgery      Allergies  Allergen Reactions  . Trolamine Salicylate Rash    Current Outpatient Prescriptions  Medication Sig Dispense Refill  . ALPRAZolam (XANAX) 0.25 MG tablet Take 1 tablet (0.25 mg total) by mouth 2 (two) times daily as needed for anxiety. 20 tablet o  . atenolol (TENORMIN) 25 MG tablet Take 1 tablet (25 mg total) by mouth every morning. Take 1/2 tablet in the PM 45 tablet 6  . cetirizine (ZYRTEC) 10 MG tablet Take 10 mg by mouth daily.    Marland Kitchen lisinopril (PRINIVIL,ZESTRIL) 10 MG tablet Take 1/2-1 tablets daily as directed 30 tablet 6  . Multiple Vitamin (MULTIVITAMIN WITH MINERALS) TABS tablet Take 1 tablet by mouth daily.    . pantoprazole (PROTONIX) 40 MG tablet Take 40 mg by mouth as needed (  GERD).     No current facility-administered medications for this visit.    Social History   Social History  . Marital Status: Single    Spouse Name: N/A  . Number of Children: N/A  . Years of Education: N/A   Occupational History  . Not on file.   Social History Main Topics  . Smoking status: Never Smoker   . Smokeless tobacco: Never Used  . Alcohol Use: No     Comment: occassionally  . Drug Use: No  . Sexual Activity: Not on file   Other Topics Concern  . Not on file   Social History Narrative   Social history is notable that he is married for 18 years.  He has one child.  He works at the [employer].  He  completed 11th grade of education.  There is no history of tobacco use.  He does not routinely exercise but does walk occasionally.  Family History  Problem Relation Age of Onset  . Lung cancer Father    Family history is notable that both parents are living at age 57.  Mother has issues with anxiety.  Father had cancer of his kidney.  He is one brother age 98 and 1 sister, age 48.  His child is age 48.  ROS General: Negative; No fevers, chills, or night sweats HEENT: Negative; No changes in vision or hearing, sinus congestion, difficulty swallowing Pulmonary: Negative; No cough, wheezing, shortness of breath, hemoptysis Cardiovascular:  See HPI;  GI: Negative; No nausea, vomiting, diarrhea, or abdominal pain GU: Negative; No dysuria, hematuria, or difficulty voiding Musculoskeletal: Recent injury to his shoulder requiring surgery on his rotator cuff Hematologic/Oncologic: Negative; no easy bruising, bleeding Endocrine: Negative; no heat/cold intolerance; no diabetes Neuro: Negative; no changes in balance, headaches Skin: Negative; No rashes or skin lesions Psychiatric: Positive for anxiety Sleep: Negative; No daytime sleepiness, hypersomnolence, bruxism, restless legs, hypnogagnic hallucinations Other comprehensive 14 point system review is negative   Physical Exam BP 130/80 mmHg  Pulse 63  Ht 6' (1.829 m)  Wt 272 lb (123.378 kg)  BMI 36.88 kg/m2   Repeat blood pressure by me 130/84  Wt Readings from Last 3 Encounters:  10/31/15 272 lb (123.378 kg)  10/14/15 250 lb (113.399 kg)  08/11/15 279 lb (126.554 kg)   General: Alert, oriented, no distress.  Moderately obese Skin: normal turgor, no rashes, warm and dry HEENT: Normocephalic, atraumatic. Pupils equal round and reactive to light; sclera anicteric; extraocular muscles intact; Fundi without hemorrhages or exudates Nose without nasal septal hypertrophy Mouth/Parynx benign; Mallinpatti scale 3 Neck: No JVD, no carotid  bruits; normal carotid upstroke Lungs: clear to ausculatation and percussion; no wheezing or rales Chest wall: without tenderness to palpitation Heart: PMI not displaced, RRR, s1 s2 normal, 1/6 systolic murmur, no diastolic murmur, no rubs, gallops, thrills, or heaves Abdomen: soft, nontender; no hepatosplenomehaly, BS+; abdominal aorta nontender and not dilated by palpation. Back: no CVA tenderness Pulses 2+ Musculoskeletal: full range of motion, normal strength, no joint deformities Extremities: no clubbing cyanosis or edema, Homan's sign negative  Neurologic: grossly nonfocal; Cranial nerves grossly wnl Psychologic: Normal mood and affect  ECG (independently read by me): Normal sinus rhythm at 63 bpm.  Normal intervals.  January 2017 ECG (independently read by me): Normal sinus rhythm at 67 bpm.  QTc interval 452 ms.  No significant ST-T changes  LABS:  BMP Latest Ref Rng 10/18/2010  Glucose 70 - 99 mg/dL 161(W)  BUN 6 - 23  mg/dL 8  Creatinine 0.4 - 1.5 mg/dL 0.091.15  Sodium 233135 - 007145 mEq/L 139  Potassium 3.5 - 5.1 mEq/L 3.9  Chloride 96 - 112 mEq/L 106  CO2 19 - 32 mEq/L 26  Calcium 8.4 - 10.5 mg/dL 9.5    No flowsheet data found.  CBC Latest Ref Rng 10/18/2010  WBC 4.0 - 10.5 K/uL 5.1  Hemoglobin 13.0 - 17.0 g/dL 17.2(H)  Hematocrit 39.0 - 52.0 % 47.0  Platelets 150 - 400 K/uL 179   Lab Results  Component Value Date   MCV 91.1 10/18/2010   No results found for: TSH No results found for: HGBA1C   BNP No results found for: BNP  ProBNP No results found for: PROBNP   Lipid Panel  No results found for: CHOL, TRIG, HDL, CHOLHDL, VLDL, LDLCALC, LDLDIRECT  RADIOLOGY: No results found.   ASSESSMENT AND PLAN: Joshua Simpson is a 45 year old gentleman who has a history of obesity, as well as hypertension and anxiety.  He had noticed some episodes of increasing shortness of breath and palpitations.  He also has been awakened from sleep with his heart pounding and notes  significant snoring as well as awakening gasping for breath.  When I saw him initially, he had significant hypertension.  He has now been on increased atenolol at 25 times in the morning and 12.50 g at night in addition to lisinopril 10 mg.  His blood pressure today is well controlled.  He denies any recurrent palpitations.  I reviewed his echo Doppler study which showed normal systolic function with grade 2 diastolic dysfunction.  He no longer has insurance.  On his initial evaluation, we did discuss the possibility of sleep apnea.  He is hopeful to get reinstated back with insurance following his workers compensation.  He will continue to monitor his blood pressure at home.  He will follow-up with Mauricio Poegina York, for primary care.  I will see him in one year for cardiology reevaluation.  Lennette Biharihomas A. Danetta Prom, MD, [facility] HospitalFACC 10/31/2015 12:14 PM

## 2016-03-29 ENCOUNTER — Other Ambulatory Visit: Payer: Self-pay

## 2016-03-29 MED ORDER — ATENOLOL 25 MG PO TABS: ORAL_TABLET | ORAL | 6 refills | Status: DC

## 2016-03-29 NOTE — Telephone Encounter (Signed)
Rx(s) sent to pharmacy electronically.  

## 2016-05-31 ENCOUNTER — Other Ambulatory Visit: Payer: Self-pay | Admitting: Cardiovascular Disease

## 2016-08-03 ENCOUNTER — Emergency Department (HOSPITAL_COMMUNITY): Payer: Self-pay

## 2016-08-03 ENCOUNTER — Encounter (HOSPITAL_COMMUNITY): Payer: Self-pay | Admitting: Emergency Medicine

## 2016-08-03 ENCOUNTER — Emergency Department (HOSPITAL_COMMUNITY)
Admission: EM | Admit: 2016-08-03 | Discharge: 2016-08-03 | Disposition: A | Discharge status: Home / Self Care | Payer: Self-pay | Attending: Physician Assistant | Admitting: Physician Assistant

## 2016-08-03 DIAGNOSIS — I10 Essential (primary) hypertension: Secondary | ICD-10-CM | POA: Insufficient documentation

## 2016-08-03 DIAGNOSIS — R109 Unspecified abdominal pain: Secondary | ICD-10-CM

## 2016-08-03 DIAGNOSIS — R1011 Right upper quadrant pain: Secondary | ICD-10-CM | POA: Insufficient documentation

## 2016-08-03 DIAGNOSIS — Z79899 Other long term (current) drug therapy: Secondary | ICD-10-CM | POA: Insufficient documentation

## 2016-08-03 LAB — URINALYSIS, ROUTINE W REFLEX MICROSCOPIC
Bilirubin Urine: NEGATIVE
GLUCOSE, UA: NEGATIVE mg/dL
HGB URINE DIPSTICK: NEGATIVE
Ketones, ur: NEGATIVE mg/dL
LEUKOCYTES UA: NEGATIVE
Nitrite: NEGATIVE
PH: 7 (ref 5.0–8.0)
Protein, ur: NEGATIVE mg/dL
SPECIFIC GRAVITY, URINE: 1.014 (ref 1.005–1.030)

## 2016-08-03 LAB — COMPREHENSIVE METABOLIC PANEL
ALT: 30 U/L (ref 17–63)
AST: 38 U/L (ref 15–41)
Albumin: 4.6 g/dL (ref 3.5–5.0)
Alkaline Phosphatase: 70 U/L (ref 38–126)
Anion gap: 10 (ref 5–15)
BUN: 7 mg/dL (ref 6–20)
CHLORIDE: 106 mmol/L (ref 101–111)
CO2: 24 mmol/L (ref 22–32)
Calcium: 9.6 mg/dL (ref 8.9–10.3)
Creatinine, Ser: 1.05 mg/dL (ref 0.61–1.24)
Glucose, Bld: 148 mg/dL — ABNORMAL HIGH (ref 65–99)
POTASSIUM: 4.6 mmol/L (ref 3.5–5.1)
SODIUM: 140 mmol/L (ref 135–145)
Total Bilirubin: 1.5 mg/dL — ABNORMAL HIGH (ref 0.3–1.2)
Total Protein: 6.9 g/dL (ref 6.5–8.1)

## 2016-08-03 LAB — CBC
HEMATOCRIT: 46.4 % (ref 39.0–52.0)
Hemoglobin: 16.8 g/dL (ref 13.0–17.0)
MCH: 33.1 pg (ref 26.0–34.0)
MCHC: 36.2 g/dL — ABNORMAL HIGH (ref 30.0–36.0)
MCV: 91.3 fL (ref 78.0–100.0)
Platelets: 218 10*3/uL (ref 150–400)
RBC: 5.08 MIL/uL (ref 4.22–5.81)
RDW: 12.5 % (ref 11.5–15.5)
WBC: 9.9 10*3/uL (ref 4.0–10.5)

## 2016-08-03 LAB — LIPASE, BLOOD: LIPASE: 16 U/L (ref 11–51)

## 2016-08-03 MED ORDER — IOPAMIDOL (ISOVUE-300) INJECTION 61%
INTRAVENOUS | Status: AC
  Administered 2016-08-03: 100 mL
  Filled 2016-08-03: qty 100

## 2016-08-03 MED ORDER — SODIUM CHLORIDE 0.9 % IV BOLUS (SEPSIS)
1000.0000 mL | Freq: Once | INTRAVENOUS | Status: AC
  Administered 2016-08-03: 1000 mL via INTRAVENOUS

## 2016-08-03 MED ORDER — ONDANSETRON HCL 4 MG PO TABS: 4.0000 mg | ORAL_TABLET | Freq: Three times a day (TID) | ORAL | 0 refills | Status: DC | PRN

## 2016-08-03 MED ORDER — MORPHINE SULFATE (PF) 4 MG/ML IV SOLN
4.0000 mg | Freq: Once | INTRAVENOUS | Status: AC
  Administered 2016-08-03: 4 mg via INTRAVENOUS
  Filled 2016-08-03: qty 1

## 2016-08-03 MED ORDER — ONDANSETRON HCL 4 MG/2ML IJ SOLN
4.0000 mg | Freq: Once | INTRAMUSCULAR | Status: AC
  Administered 2016-08-03: 4 mg via INTRAVENOUS
  Filled 2016-08-03: qty 2

## 2016-08-03 MED ORDER — FENTANYL CITRATE (PF) 100 MCG/2ML IJ SOLN
50.0000 ug | Freq: Once | INTRAMUSCULAR | Status: AC
  Administered 2016-08-03: 50 ug via INTRAVENOUS
  Filled 2016-08-03: qty 2

## 2016-08-03 MED ORDER — KETOROLAC TROMETHAMINE 30 MG/ML IJ SOLN
30.0000 mg | Freq: Once | INTRAMUSCULAR | Status: AC
  Administered 2016-08-03: 30 mg via INTRAVENOUS
  Filled 2016-08-03: qty 1

## 2016-08-03 NOTE — ED Provider Notes (Signed)
[facility]-EMERGENCY DEPT Provider Note   CSN: [id number] Arrival date & time: 08/03/16  8657     History   Chief Complaint Chief Complaint  Patient presents with  . Abdominal Pain    HPI Joshua Simpson is a 46 y.o. male.  HPI   Patient is a 46 year old male presenting with pain. It radiates from her right back to right lower quadrant. Started this morning at 2 AM. Patient cannot find a comfortable position. Patient had no urinary or other complaints leading up to this. No fevers. No ho kidney stones.   Past Medical History:  Diagnosis Date  . Anxiety   . Hypertension     Patient Active Problem List   Diagnosis Date Noted  . Palpitations 08/15/2015  . Obesity (BMI 30-39.9) 08/15/2015  . ABDOMINAL PAIN, LEFT UPPER QUADRANT 04/25/2010  . Anxiety state 04/18/2010  . Essential hypertension 04/18/2010  . PAIN IN JOINT, ANKLE AND FOOT 04/18/2010  . EPIGASTRIC PAIN 04/18/2010    Past Surgical History:  Procedure Laterality Date  . SHOULDER SURGERY         Home Medications    Prior to Admission medications   Medication Sig Start Date End Date Taking? Authorizing Provider  ALPRAZolam (XANAX) 0.25 MG tablet Take 1 tablet (0.25 mg total) by mouth 2 (two) times daily as needed for anxiety. 10/31/15  Yes Lennette Bihari, MD  atenolol (TENORMIN) 25 MG tablet Take 1 tablet (25 mg total) by mouth every morning and take 0.5 tablet (12.5 mg total) every evening. 03/29/16  Yes Lennette Bihari, MD  cetirizine (ZYRTEC) 10 MG tablet Take 10 mg by mouth daily.   Yes Historical Provider, MD  lisinopril (PRINIVIL,ZESTRIL) 10 MG tablet TAKE 1/2 TO 1 TABLET BY MOUTH DAILY AS DIRECTED 05/31/16  Yes Lennette Bihari, MD  Multiple Vitamin (MULTIVITAMIN WITH MINERALS) TABS tablet Take 1 tablet by mouth daily.   Yes Historical Provider, MD  pantoprazole (PROTONIX) 40 MG tablet Take 40 mg by mouth as needed (GERD).   Yes Historical Provider, MD    Family History Family History  Problem Relation Age of  Onset  . Lung cancer Father     Social History Social History  Substance Use Topics  . Smoking status: Never Smoker  . Smokeless tobacco: Never Used  . Alcohol use No     Comment: occassionally     Allergies   Trolamine salicylate   Review of Systems Review of Systems  Constitutional: Negative for fatigue and fever.  Genitourinary: Positive for flank pain.  Neurological: Negative for weakness.  All other systems reviewed and are negative.    Physical Exam Updated Vital Signs BP 119/83 (BP Location: Left Arm)   Pulse 98   Temp 97.3 F (36.3 C) (Oral)   Resp 18   Ht 6' (1.829 m)   Wt 260 lb (117.9 kg)   SpO2 95%   BMI 35.26 kg/m   Physical Exam  Constitutional: He is oriented to person, place, and time. He appears well-nourished.  HENT:  Head: Normocephalic.  Eyes: Conjunctivae are normal.  Cardiovascular: Normal rate.   Pulmonary/Chest: Effort normal.  Abdominal: Soft. He exhibits no distension. There is no tenderness.  Neurological: He is oriented to person, place, and time.  Skin: Skin is warm and dry. He is not diaphoretic.  Psychiatric: He has a normal mood and affect. His behavior is normal.     ED Treatments / Results  Labs (all labs ordered are listed, but only abnormal results are  displayed) Labs Reviewed  COMPREHENSIVE METABOLIC PANEL - Abnormal; Notable for the following:       Result Value   Glucose, Bld 148 (*)    Total Bilirubin 1.5 (*)    All other components within normal limits  CBC - Abnormal; Notable for the following:    MCHC 36.2 (*)    All other components within normal limits  URINE CULTURE  LIPASE, BLOOD  URINALYSIS, ROUTINE W REFLEX MICROSCOPIC    EKG  EKG Interpretation None       Radiology Ct Abdomen Pelvis W Contrast  Result Date: 08/03/2016 CLINICAL DATA:  Upper and mid abdominal pain extending to the right. EXAM: CT ABDOMEN AND PELVIS WITH CONTRAST TECHNIQUE: Multidetector CT imaging of the abdomen and pelvis  was performed using the standard protocol following bolus administration of intravenous contrast. CONTRAST:  100mL ISOVUE-300 IOPAMIDOL (ISOVUE-300) INJECTION 61% COMPARISON:  CT of the abdomen and pelvis 05/29/2010 FINDINGS: Lower chest: Minimal dependent atelectasis is present. Heart size is normal. No significant pleural or pericardial effusion is present. Hepatobiliary: There is diffuse fatty infiltration of liver. 2 or 3 subcentimeter is cysts are evident. No other discrete lesions are present. Liver contour is within normal limits. The common bile duct is within normal limits. A focal density is present posteriorly within the gallbladder measuring 11 mm. This may represent a stone or polyp. Other small peripheral densities are present near the fundus, potentially additional stones or polyps. Pancreas: Unremarkable. No pancreatic ductal dilatation or surrounding inflammatory changes. Spleen: Normal in size without focal abnormality. Adrenals/Urinary Tract: The adrenal glands are normal bilaterally. The kidneys and ureters are within normal limits. The urinary bladder is unremarkable. Stomach/Bowel: The stomach and duodenum are within normal limits. Small bowel is unremarkable. It is mostly collapsed. The appendix is visualized and normal. There is slight inflammatory change about the proximal ascending colon without a discrete mass lesion or abscess. There is no free air. The transverse colon is normal. Diverticular changes are present in the distal descending colon and throughout the sigmoid colon without focal inflammation to suggest diverticulitis. Vascular/Lymphatic: No significant vascular findings are present. No enlarged abdominal or pelvic lymph nodes. Reproductive: Calcifications are present within the prostate gland. Transverse diameter is 4.6 cm. Other: No significant free fluid or free air is present. 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 Grade 1 anterolisthesis at L5-S1 and  slight retrolisthesis at L4-5 are similar the prior study. No focal lytic or blastic lesions are present. The bony pelvis is intact. IMPRESSION: 1. Slight inflammatory changes within the proximal ascending colon could represent a focal colitis. No focal mass lesion is present. There is no free air or abscess. 2. Scattered peripheral densities within the gallbladder. This most likely represents stones or polyps. Neoplasm is considered less likely. Right upper quadrant ultrasound would be useful for further evaluation as stones were not present on previous ultrasound studies. 3. Hepatic steatosis, a chronic finding. 4. Mild prominence of the prostate gland without focal lesion. Electronically Signed   By: Marin Robertshristopher  Mattern M.D.   On: 08/03/2016 10:27   [place] Abdomen Limited Ruq  Result Date: 08/03/2016 CLINICAL DATA:  Right upper quadrant pain. EXAM: US ABDOMEN LIMITED - RIGHT UPPER QUADRANT COMPARISON:  CT 08/03/2016. FINDINGS: Gallbladder: No gallstones or wall thickening visualized. No sonographic Murphy sign noted by sonographer. Common bile duct: Diameter: 5.4 mm Liver: The liver is again noted to be echogenic consistent fatty infiltration. Focal small hypoechoic area noted adjacent the gallbladder fossa again noted.  This is again consistent with focal fatty sparing. IMPRESSION: Echogenic liver consistent fatty infiltration and/or hepatocellular disease. Area of focal fatty sparing again noted adjacent to the gallbladder fossa. Exam stable from prior exam. Electronically Signed   By: Maisie Fus  Register   On: 08/03/2016 11:48    Procedures Procedures (including critical care time)  Medications Ordered in ED Medications  sodium chloride 0.9 % bolus 1,000 mL (0 mLs Intravenous Stopped 08/03/16 0907)  morphine 4 MG/ML injection 4 mg (4 mg Intravenous Given 08/03/16 0749)  ondansetron (ZOFRAN) injection 4 mg (4 mg Intravenous Given 08/03/16 0750)  fentaNYL (SUBLIMAZE) injection 50 mcg (50 mcg Intravenous  Given 08/03/16 0859)  ondansetron (ZOFRAN) injection 4 mg (4 mg Intravenous Given 08/03/16 0903)  iopamidol (ISOVUE-300) 61 % injection (100 mLs  Contrast Given 08/03/16 0949)  ketorolac (TORADOL) 30 MG/ML injection 30 mg (30 mg Intravenous Given 08/03/16 1008)     Initial Impression / Assessment and Plan / ED Course  I have reviewed the triage vital signs and the nursing notes.  Pertinent labs & imaging results that were available during my care of the patient were reviewed by me and considered in my medical decision making (see chart for details).  Patient is a 46 year old male with no past medical history presenting with right flank pain radiating to his right lower quadrant. Patient in acute distress on arrival. We'll treat with pain medication, fluids, antiemetics. We will get CT.  Urine showed no evidence of hematuria therefore get CT with contrast.  CT with contrast showed focal colitis.  Also showed questionable gallstones. Ultrasound shows no evidence of the gallstones. Pt told to follow up about gallbladder polyps.  Patient's pain is completely resolved. I'm unsure what caused the pain. Whether this is acute infectious versus inflammatory versus ischemic colitis. That's now resolved. Versus a kidney stone that was too small to note on scan. Patient is not in A. fib. Has no signs of infection including fever. No blood in the stool. And it is very focal. Therefore would not do antibiotics as a first line.  However patient feels completely back to baseline normal vital signs normal physical exam at this time. We'll have him treat symptomatically at home and follow up closely with his primary care physician to further explore. Patient is comfortable, ambulatory, and taking PO at time of discharge.  Patient expressed understanding about return precautions.    Final Clinical Impressions(s) / ED Diagnoses   Final diagnoses:  RUQ pain    New Prescriptions New Prescriptions   No  medications on file     Courteney Randall An, MD 08/03/16 1226

## 2016-08-03 NOTE — ED Triage Notes (Signed)
Pt woke up at 2AM with RLQ abdominal pain. Pt denies N/V. Pt is crying and restless and states pain is 10/10.

## 2016-08-03 NOTE — Discharge Instructions (Signed)
You have found to have an area of colitis on your scan. In addition you were found to have polyps in your gallbladder. You should follow up with the surgeon about these polyps.  We are unsure what caused the colitis. Please keep track of your symptoms and return if you have any concerns. We'll first just try rest at home with clear liquids for several days. Then advanceyour diet.  Please return with any increase in pain, symptoms are other concerns.

## 2016-08-03 NOTE — ED Notes (Signed)
Patient d/c'd from IV, continuous pulse oximetry and blood pressure cuff; patient getting dressed to be discharged home; patient also given a [place]key sandwich and Sprite to drink

## 2016-08-04 LAB — URINE CULTURE: CULTURE: NO GROWTH

## 2016-08-31 ENCOUNTER — Encounter (HOSPITAL_COMMUNITY): Payer: Self-pay

## 2016-08-31 ENCOUNTER — Emergency Department (HOSPITAL_COMMUNITY): Payer: Self-pay

## 2016-08-31 DIAGNOSIS — Z79899 Other long term (current) drug therapy: Secondary | ICD-10-CM | POA: Insufficient documentation

## 2016-08-31 DIAGNOSIS — I10 Essential (primary) hypertension: Secondary | ICD-10-CM | POA: Insufficient documentation

## 2016-08-31 DIAGNOSIS — R079 Chest pain, unspecified: Secondary | ICD-10-CM | POA: Insufficient documentation

## 2016-08-31 LAB — CBC
HEMATOCRIT: 44.8 % (ref 39.0–52.0)
Hemoglobin: 16.1 g/dL (ref 13.0–17.0)
MCH: 32.6 pg (ref 26.0–34.0)
MCHC: 35.9 g/dL (ref 30.0–36.0)
MCV: 90.7 fL (ref 78.0–100.0)
Platelets: 180 10*3/uL (ref 150–400)
RBC: 4.94 MIL/uL (ref 4.22–5.81)
RDW: 11.9 % (ref 11.5–15.5)
WBC: 4.6 10*3/uL (ref 4.0–10.5)

## 2016-08-31 LAB — BASIC METABOLIC PANEL
Anion gap: 11 (ref 5–15)
BUN: 11 mg/dL (ref 6–20)
CHLORIDE: 105 mmol/L (ref 101–111)
CO2: 24 mmol/L (ref 22–32)
Calcium: 9.7 mg/dL (ref 8.9–10.3)
Creatinine, Ser: 1.01 mg/dL (ref 0.61–1.24)
GFR calc Af Amer: >60 mL/min (ref >60)
GFR calc non Af Amer: >60 mL/min (ref >60)
Glucose, Bld: 104 mg/dL — ABNORMAL HIGH (ref 65–99)
POTASSIUM: 3.8 mmol/L (ref 3.5–5.1)
Sodium: 140 mmol/L (ref 135–145)

## 2016-08-31 LAB — I-STAT TROPONIN, ED: Troponin i, poc: 0 ng/mL (ref 0.00–0.08)

## 2016-08-31 NOTE — ED Triage Notes (Signed)
Pt reports left sided sharp, non radiating chest pain that started tonight around 8pm while sitting down "it was like a cramp" associated with SOB. He states the pain lasted about 20 minutes but still feels SOB.

## 2016-09-01 ENCOUNTER — Emergency Department (HOSPITAL_COMMUNITY)
Admission: EM | Admit: 2016-09-01 | Discharge: 2016-09-01 | Disposition: A | Discharge status: Home / Self Care | Payer: Self-pay | Attending: Emergency Medicine | Admitting: Emergency Medicine

## 2016-09-01 DIAGNOSIS — R079 Chest pain, unspecified: Secondary | ICD-10-CM

## 2016-09-01 LAB — TROPONIN I: Troponin I: <0.03 ng/mL (ref <0.03)

## 2016-09-01 NOTE — ED Notes (Signed)
Pt understood dc material. NAD noted. 

## 2016-09-01 NOTE — ED Provider Notes (Signed)
[facility]-EMERGENCY DEPT Provider Note   CSN: [id number] Arrival date & time: 08/31/16  2124   By signing my name below, I, Clovis Pu, attest that this documentation has been prepared under the direction and in the presence of Glynn Octave, MD  Electronically Signed: Clovis Pu, ED Scribe. 09/01/16. 3:25 AM.   History   Chief Complaint Chief Complaint  Patient presents with  . Chest Pain   The history is provided by the patient. No language interpreter was used.   HPI Comments:  Joshua Simpson is a 46 y.o. male, with a hx of HTN, anxiety and family hx of heart problems (mother), who presents to the Emergency Department complaining of an episode of sharp left sided chest pain which lasted 20 minutes at 8 PM yesterday. He states he was playing with his dog when his symptoms began. He also reports associated SOB and clammy feet during this episode of pain. No alleviating factors noted. Pt denies radiation of his pain, current chest pain, diaphoresis, abdominal pain, nausea, vomiting, back pain, fevers, a hx of heart problems or any other associated symptoms. Pt is currently on a Z-pack for flu-like symptoms. Pt reports his last stress test was several years ago and an echocardiogram about a year ago.     Cardiologist: Dr. Nicki Guadalajara   Past Medical History:  Diagnosis Date  . Anxiety   . Hypertension     Patient Active Problem List   Diagnosis Date Noted  . Palpitations 08/15/2015  . Obesity (BMI 30-39.9) 08/15/2015  . ABDOMINAL PAIN, LEFT UPPER QUADRANT 04/25/2010  . Anxiety state 04/18/2010  . Essential hypertension 04/18/2010  . PAIN IN JOINT, ANKLE AND FOOT 04/18/2010  . EPIGASTRIC PAIN 04/18/2010    Past Surgical History:  Procedure Laterality Date  . SHOULDER SURGERY      Home Medications    Prior to Admission medications   Medication Sig Start Date End Date Taking? Authorizing Provider  ALPRAZolam (XANAX) 0.25 MG tablet Take 1 tablet (0.25 mg total) by mouth 2  (two) times daily as needed for anxiety. 10/31/15   Lennette Bihari, MD  atenolol (TENORMIN) 25 MG tablet Take 1 tablet (25 mg total) by mouth every morning and take 0.5 tablet (12.5 mg total) every evening. 03/29/16   Lennette Bihari, MD  cetirizine (ZYRTEC) 10 MG tablet Take 10 mg by mouth daily.    Historical Provider, MD  lisinopril (PRINIVIL,ZESTRIL) 10 MG tablet TAKE 1/2 TO 1 TABLET BY MOUTH DAILY AS DIRECTED 05/31/16   Lennette Bihari, MD  Multiple Vitamin (MULTIVITAMIN WITH MINERALS) TABS tablet Take 1 tablet by mouth daily.    Historical Provider, MD  ondansetron (ZOFRAN) 4 MG tablet Take 1 tablet (4 mg total) by mouth every 8 (eight) hours as needed for nausea or vomiting. 08/03/16   Courteney Lyn Mackuen, MD  pantoprazole (PROTONIX) 40 MG tablet Take 40 mg by mouth as needed (GERD).    Historical Provider, MD    Family History Family History  Problem Relation Age of Onset  . Lung cancer Father     Social History Social History  Substance Use Topics  . Smoking status: Never Smoker  . Smokeless tobacco: Never Used  . Alcohol use No     Comment: occassionally     Allergies   Trolamine salicylate   Review of Systems Review of Systems  Constitutional: Negative for diaphoresis and fever.  Respiratory: Positive for shortness of breath.   Cardiovascular: Positive for chest pain.  Gastrointestinal: Negative  for abdominal pain, nausea and vomiting.  Musculoskeletal: Negative for back pain.  All other systems reviewed and are negative.  Physical Exam Updated Vital Signs BP 118/88 (BP Location: Left Arm)   Pulse 71   Temp 99.2 F (37.3 C) (Oral)   Resp 18   SpO2 96%   Physical Exam  Constitutional: He is oriented to person, place, and time. He appears well-developed and well-nourished. No distress.  HENT:  Head: Normocephalic and atraumatic.  Mouth/Throat: Oropharynx is clear and moist. No oropharyngeal exudate.  Eyes: Conjunctivae and EOM are normal. Pupils are equal,  round, and reactive to light.  Neck: Normal range of motion. Neck supple.  No meningismus.  Cardiovascular: Normal rate, regular rhythm, normal heart sounds and intact distal pulses.   No murmur heard. Pulmonary/Chest: Effort normal and breath sounds normal. No respiratory distress. He exhibits no tenderness.  Abdominal: Soft. There is no tenderness. There is no rebound and no guarding.  Musculoskeletal: Normal range of motion. He exhibits no edema or tenderness.  Neurological: He is alert and oriented to person, place, and time. No cranial nerve deficit. He exhibits normal muscle tone. Coordination normal.   5/5 strength throughout. CN 2-12 intact.Equal grip strength.   Skin: Skin is warm.  Psychiatric: He has a normal mood and affect. His behavior is normal.  Nursing note and vitals reviewed.  ED Treatments / Results  DIAGNOSTIC STUDIES:  Oxygen Saturation is 96% on RA, normal by my interpretation.    COORDINATION OF CARE:  3:24 AM Discussed treatment plan with pt at bedside and pt agreed to plan.  Labs (all labs ordered are listed, but only abnormal results are displayed) Labs Reviewed  BASIC METABOLIC PANEL - Abnormal; Notable for the following:       Result Value   Glucose, Bld 104 (*)    All other components within normal limits  CBC  TROPONIN I  I-STAT TROPOININ, ED    EKG  EKG Interpretation  Date/Time:  Friday August 31 2016 21:28:50 EST Ventricular Rate:  87 PR Interval:  180 QRS Duration: 84 QT Interval:  380 QTC Calculation: 457 R Axis:   86 Text Interpretation:  Normal sinus rhythm Abnormal QRS-T angle, consider primary T wave abnormality Abnormal ECG Nonspecific T wave abnormality No significant change was found Confirmed by Manus Gunning  MD, Markella Dao [phone]) on 09/01/2016 3:12:20 AM       Radiology Dg Chest 2 View  Result Date: 08/31/2016 CLINICAL DATA:  Initial evaluation for acute chest pain. EXAM: CHEST  2 VIEW COMPARISON:  Prior radiograph from  10/18/2010. FINDINGS: The cardiac and mediastinal silhouettes are stable in size and contour, and remain within normal limits. The lungs are normally inflated. No airspace consolidation, pleural effusion, or pulmonary edema is identified. There is no pneumothorax. No acute osseous abnormality identified. IMPRESSION: No active cardiopulmonary disease. Electronically Signed   By: Rise Mu M.D.   On: 08/31/2016 22:07    Procedures Procedures (including critical care time)  Medications Ordered in ED Medications - No data to display   Initial Impression / Assessment and Plan / ED Course  I have reviewed the triage vital signs and the nursing notes.  Pertinent labs & imaging results that were available during my care of the patient were reviewed by me and considered in my medical decision making (see chart for details).     Patient with episode of left-sided chest pain while sitting down around 8 PM. This was resolved by 820. Associated with shortness of  breath. No Other episodes of pain. Chest pain-free on arrival. EKG shows non specific T wave changes. Troponin negative. Chest x-ray negative.   Troponin negative 2. No further chest pain 8 hours later. EKG with nonspecific T-wave flattening laterally.  Doubt PE, doubt aortic dissection.   Heart score is 2. Patient stable for outpatient stress test. Discussed with patient. Follow with PCP. Return precautions discussed especially if pain becomes exertional or develops diaphoresis or exertional shortness of breath. Final Clinical Impressions(s) / ED Diagnoses   Final diagnoses:  Nonspecific chest pain    New Prescriptions New Prescriptions   No medications on file  I personally performed the services described in this documentation, which was scribed in my presence. The recorded information has been reviewed and is accurate.     Glynn OctaveStephen Shaneta Cervenka, MD 09/01/16 [phone]

## 2016-09-01 NOTE — ED Notes (Signed)
Pt called out requesting a status update. RN informed patient we are waiting on a lab test to result

## 2016-09-01 NOTE — ED Notes (Signed)
Patient/family aggravated with wait. I listened to concerns, re-checked vitals signed which were good. Encouraged to stay for evaluation. Patient/family appreciated rounding.

## 2016-09-01 NOTE — Discharge Instructions (Signed)
There is no evidence of heart attack tonight. Follow up with Dr. Tresa EndoKelly for a stress test. Return to the ED if you chest pain becomes exertional, you develop shortness of breath with exertion or any other concerns.

## 2016-12-04 ENCOUNTER — Telehealth: Payer: Self-pay | Admitting: Cardiovascular Disease

## 2016-12-04 NOTE — Telephone Encounter (Signed)
New Message     Pt c/o of Chest Pain: STAT if CP now or developed within 24 hours  1. Are you having CP right now? no  2. Are you experiencing any other symptoms (ex. SOB, nausea, vomiting, sweating)?  no  3. How long have you been experiencing CP? A few days  4. Is your CP continuous or coming and going?  Comes and goes   5. Have you taken Nitroglycerin? no?

## 2016-12-04 NOTE — Telephone Encounter (Signed)
Attempt to return call- no answer, unable to leave VM 

## 2016-12-07 NOTE — Telephone Encounter (Signed)
Unable to reach pt or leave a message  

## 2016-12-12 ENCOUNTER — Ambulatory Visit (INDEPENDENT_AMBULATORY_CARE_PROVIDER_SITE_OTHER): Payer: Self-pay | Admitting: Cardiovascular Disease

## 2016-12-12 ENCOUNTER — Encounter: Payer: Self-pay | Admitting: Cardiovascular Disease

## 2016-12-12 VITALS — BP 122/84 | HR 66 | Ht 72.0 in | Wt 269.2 lb

## 2016-12-12 DIAGNOSIS — Z79899 Other long term (current) drug therapy: Secondary | ICD-10-CM

## 2016-12-12 DIAGNOSIS — R002 Palpitations: Secondary | ICD-10-CM

## 2016-12-12 DIAGNOSIS — I10 Essential (primary) hypertension: Secondary | ICD-10-CM

## 2016-12-12 DIAGNOSIS — G478 Other sleep disorders: Secondary | ICD-10-CM

## 2016-12-12 DIAGNOSIS — F411 Generalized anxiety disorder: Secondary | ICD-10-CM

## 2016-12-12 DIAGNOSIS — E669 Obesity, unspecified: Secondary | ICD-10-CM

## 2016-12-12 DIAGNOSIS — R0789 Other chest pain: Secondary | ICD-10-CM

## 2016-12-12 MED ORDER — ATENOLOL 25 MG PO TABS: 25.0000 mg | ORAL_TABLET | Freq: Two times a day (BID) | ORAL | 6 refills | Status: DC

## 2016-12-12 NOTE — Patient Instructions (Addendum)
Medication Instructions:   The atenolol has been increased to 25 mg twice a day.    Labwork:  Fasting labs ordered. Slips provided to you today.  Testing/Procedures:  Your physician has requested that you have an exercise tolerance test. For further information please visit [email or web address]. Please also follow instruction sheet, as given.      Follow-Up:  Your physician wants you to follow-up in: 6 months or sooner if needed. You will receive a reminder letter in the mail two months in advance. If you don't receive a letter, please call our office to schedule the follow-up appointment.   Any Other Special Instructions Will Be Listed Below (If Applicable). +

## 2016-12-12 NOTE — Progress Notes (Signed)
Patient ID: Joshua Simpson, male   DOB: 07-Sep-1970, 46 y.o.   MRN: [id number]     Primary: Mauricio Po, NP  PATIENT PROFILE: Joshua Simpson is a 46 y.o. male who presents for a 13 month follow-up cardiology evaluation.  HPI:  Joshua Simpson has a history of hypertension and in 2011 experience atypical chest pain.  He was evaluated by Dr. Royann Shivers initially in  2011.  His chest pain was felt to be atypical and not exertionally related and most likely musculoskeletal.  It was felt that he had anxiety driven, sinus tachycardia and significant cardiac deconditioning.  His last cardiac evaluation was 5 years ago by Dr. Herbie Baltimore.  The patient has issues with anxiety.  Over the last 3 weeks.  He is experiencing increased episodes of shortness of breath.  He denies chest pain.  He does have hypertension.  He has been awakened from sleep, particularly around 5 AM with his heart pounding.  He admits to very poor sleep.  His sleep is nonrestorative.  He does note some daytime sleepiness.  He snores and at times has awakened gasping for breath.  He has taken Xanax in the past for anxiety.  He has GERD for which he has taken pantoprazole.  He had recently been out of work after surgery which her surgery for [employer].  Because of his increasing symptoms associated with increased blood pressure, he was referred for cardiology evaluation.  When I saw him initially he had significant hypertension and his blood pressure was 150/110.  He had been on low-dose atenolol and I increased this to 25 mg in the morning and 12.5 mg in the evening.  I also started him on lisinopril at 5 mg daily.  He underwent an echo Doppler study which was done on 09/21/2015.  This showed an ejection fraction of 60-65% without wall motion abnormalities.  There was grade 2 diastolic dysfunction.  His aortic root was upper normal at 37 mm.  There was trivial MR.  When I last saw him one year ago April 2017.  His blood pressure had stabilized and  typically runs in the 120 systolic range and in the 80s in the diastolic range at home.  He denied chest pain.  He does have anxiety issues and has taking alprazolam intermittently.  He has history of GERD for which he has been on pantoprazole.  I was concerned with sleep apnea but due to his lack of insurance he wished to defer evaluation.  Since I last saw him, he has felt well.  He is finally getting over his rotator cuff surgery.  He has developed intermittent episodes of chest pain with atypical features.  This led to emergency room evaluation February 2018.  Troponins were negative.  He did not have acute ECG changes, but he did have nonspecific lateral T-wave abnormality.  Chest x-ray did not reveal any significant abnormality.  He states that he is sleeping better.  He has not had recent awakenings with gasping for breath.  He does snore intermittently.  He has one episode of urination per night.  He presents for evaluation.   Past Medical History:  Diagnosis Date  . Anxiety   . Hypertension     Past Surgical History:  Procedure Laterality Date  . SHOULDER SURGERY      Allergies  Allergen Reactions  . Trolamine Salicylate Rash    Current Outpatient Prescriptions  Medication Sig Dispense Refill  . atenolol (TENORMIN) 25 MG tablet Take 1 tablet (25 mg  total) by mouth 2 (two) times daily. 60 tablet 6  . cetirizine (ZYRTEC) 10 MG tablet Take 10 mg by mouth daily.    Marland Kitchen lisinopril (PRINIVIL,ZESTRIL) 10 MG tablet Take one-half tablet (5 mg) by mouth daily.    . Multiple Vitamin (MULTIVITAMIN WITH MINERALS) TABS tablet Take 1 tablet by mouth daily.    . ondansetron (ZOFRAN) 4 MG tablet Take 1 tablet (4 mg total) by mouth every 8 (eight) hours as needed for nausea or vomiting. 11 tablet 0  . pantoprazole (PROTONIX) 40 MG tablet Take 40 mg by mouth as needed (GERD).     No current facility-administered medications for this visit.     Social History   Social History  . Marital  status: Single    Spouse name: N/A  . Number of children: N/A  . Years of education: N/A   Occupational History  . Not on file.   Social History Main Topics  . Smoking status: Never Smoker  . Smokeless tobacco: Never Used  . Alcohol use No     Comment: occassionally  . Drug use: No  . Sexual activity: Not on file   Other Topics Concern  . Not on file   Social History Narrative  . No narrative on file   Social history is notable that he is married for 18 years.  He has one child.  He works at the [employer].  He completed 11th grade of education.  There is no history of tobacco use.  He does not routinely exercise but does walk occasionally.  Family History  Problem Relation Age of Onset  . Lung cancer Father    Family history is notable that both parents are living at age 3.  Mother has issues with anxiety.  Father had cancer of his kidney.  He is one brother age 4 and 1 sister, age 52.  His child is age 23.  ROS General: Negative; No fevers, chills, or night sweats HEENT: Negative; No changes in vision or hearing, sinus congestion, difficulty swallowing Pulmonary: Negative; No cough, wheezing, shortness of breath, hemoptysis Cardiovascular:  See HPI;  GI: Negative; No nausea, vomiting, diarrhea, or abdominal pain GU: Negative; No dysuria, hematuria, or difficulty voiding Musculoskeletal:Status post right rotator cuff surgery. Hematologic/Oncologic: Negative; no easy bruising, bleeding Endocrine: Negative; no heat/cold intolerance; no diabetes Neuro: Negative; no changes in balance, headaches Skin: Negative; No rashes or skin lesions Psychiatric: Positive for anxiety Sleep: Negative; No daytime sleepiness, hypersomnolence, bruxism, restless legs, hypnogagnic hallucinations Other comprehensive 14 point system review is negative   Physical Exam BP 122/84   Pulse 66   Ht 6' (1.829 m)   Wt 269 lb 3.2 oz (122.1 kg)   BMI 36.51 kg/m    Repeat blood  pressure by me 112/78  Wt Readings from Last 3 Encounters:  12/12/16 269 lb 3.2 oz (122.1 kg)  08/03/16 260 lb (117.9 kg)  10/31/15 272 lb (123.4 kg)   General: Alert, oriented, no distress.  Moderately obese Skin: normal turgor, no rashes, warm and dry HEENT: Normocephalic, atraumatic. Pupils equal round and reactive to light; sclera anicteric; extraocular muscles intact; Fundi without hemorrhages or exudates Nose without nasal septal hypertrophy Mouth/Parynx benign; Mallinpatti scale 3 Neck: No JVD, no carotid bruits; normal carotid upstroke Lungs: clear to ausculatation and percussion; no wheezing or rales Chest wall: Minimal left chest wall tenderness. Heart: PMI not displaced, RRR, s1 s2 normal, 1/6 systolic murmur, no diastolic murmur, no rubs, gallops, thrills, or heaves Abdomen:  soft, nontender; no hepatosplenomehaly, BS+; abdominal aorta nontender and not dilated by palpation. Back: no CVA tenderness Pulses 2+ Musculoskeletal: full range of motion, normal strength, no joint deformities Extremities: no clubbing cyanosis or edema, Homan's sign negative  Neurologic: grossly nonfocal; Cranial nerves grossly wnl Psychologic: Normal mood and affect  ECG (independently read by me): Normal sinus rhythm at 66 bpm.  QTc interval 425 ms, PR interval 192 ms.  No significant ST-T changes.  Mild nondiagnostic T change in lead 3.  April 2017 ECG (independently read by me): Normal sinus rhythm at 63 bpm.  Normal intervals.  January 2017 ECG (independently read by me): Normal sinus rhythm at 67 bpm.  QTc interval 452 ms.  No significant ST-T changes  LABS:  BMP Latest Ref Rng & Units 08/31/2016 08/03/2016 10/18/2010  Glucose 65 - 99 mg/dL 104(H) 148(H) 121(H)  BUN 6 - 20 mg/dL '11 7 8  '$ Creatinine 0.61 - 1.24 mg/dL 1.01 1.05 1.15  Sodium 135 - 145 mmol/L 140 140 139  Potassium 3.5 - 5.1 mmol/L 3.8 4.6 3.9  Chloride 101 - 111 mmol/L 105 106 106  CO2 22 - 32 mmol/L '24 24 26  '$ Calcium 8.9 -  10.3 mg/dL 9.7 9.6 9.5    Hepatic Function Latest Ref Rng & Units 08/03/2016  Total Protein 6.5 - 8.1 g/dL 6.9  Albumin 3.5 - 5.0 g/dL 4.6  AST 15 - 41 U/L 38  ALT 17 - 63 U/L 30  Alk Phosphatase 38 - 126 U/L 70  Total Bilirubin 0.3 - 1.2 mg/dL 1.5(H)    CBC Latest Ref Rng & Units 08/31/2016 08/03/2016 10/18/2010  WBC 4.0 - 10.5 K/uL 4.6 9.9 5.1  Hemoglobin 13.0 - 17.0 g/dL 16.1 16.8 17.2(H)  Hematocrit 39.0 - 52.0 % 44.8 46.4 47.0  Platelets 150 - 400 K/uL 180 218 179   Lab Results  Component Value Date   MCV 90.7 08/31/2016   MCV 91.3 08/03/2016   MCV 91.1 10/18/2010   No results found for: TSH No results found for: HGBA1C   BNP No results found for: BNP  ProBNP No results found for: PROBNP   Lipid Panel  No results found for: CHOL, TRIG, HDL, CHOLHDL, VLDL, LDLCALC, LDLDIRECT  RADIOLOGY: No results found.  IMPRESSION:  1. Atypical chest pain   2. Essential hypertension   3. Medication management   4. Palpitations   5. Anxiety state   6. Obesity (BMI 30-39.9)   7. Sleep arousal disorder      ASSESSMENT AND PLAN: Joshua Simpson is a 46 year old gentleman who has a history of obesity, as well as hypertension and anxiety.  He had noticed some episodes of increasing shortness of breath and palpitations.  He also has been awakened from sleep with his heart pounding and notes significant snoring as well as awakening gasping for breath.  When I saw him initially, he had significant hypertension.  He has  been on  atenolol at 25 mg in the morning and 12.5 mg at night in addition to lisinopril 5 mg. over the year, his chest pounding has improved but he still notes occasional palpitations.  I have recommended titration of atenolol to 25 mg twice a day.  His echo Doppler study  showed normal systolic function with grade 2 diastolic dysfunction.  Today, his blood pressure is excellent on his current regimen.  I reviewed his most recent emergency room evaluation at that time  there was discussion concerning outpatient stress testing.  The patient does have  issues with anxiety and has had recurrent episodes of left-sided chest pain.  I suspect this most likely is musculoskeletal, but it is limiting seeing him in his ability to be active.  He [place] the beach and 46 year old male who appeared to be in excellent shape at a heart attack while in the pool, which made him very alarmed.  I will schedule him for a routine graded exercise treadmill test for further evaluation of his atypical chest pain.  A complete set of fasting laboratory will be obtained.  I will notify him regarding the results and if stable, I will see him in 6 months for follow-up evaluation.  If the treadmill test is abnormal, I will see him back sooner and further days made that time.  His BMI is 36.51 and is consistent with obesity.  Weight loss, increased exercise was recommended.  Time spent: 25 minutes Troy Sine, MD, [facility] 12/12/2016 3:54 PM

## 2016-12-20 ENCOUNTER — Telehealth (HOSPITAL_COMMUNITY): Payer: Self-pay

## 2016-12-20 NOTE — Telephone Encounter (Signed)
Encounter complete. 

## 2016-12-21 ENCOUNTER — Telehealth (HOSPITAL_COMMUNITY): Payer: Self-pay

## 2016-12-21 NOTE — Telephone Encounter (Signed)
Encounter complete. 

## 2016-12-25 ENCOUNTER — Ambulatory Visit (HOSPITAL_COMMUNITY)
Admission: RE | Admit: 2016-12-25 | Discharge: 2016-12-25 | Disposition: A | Discharge status: Home / Self Care | Payer: Self-pay | Source: Ambulatory Visit | Attending: Cardiovascular Disease | Admitting: Cardiovascular Disease

## 2016-12-25 DIAGNOSIS — I10 Essential (primary) hypertension: Secondary | ICD-10-CM | POA: Insufficient documentation

## 2016-12-25 DIAGNOSIS — R0789 Other chest pain: Secondary | ICD-10-CM | POA: Insufficient documentation

## 2016-12-25 LAB — EXERCISE TOLERANCE TEST
CHL CUP MPHR: 176 {beats}/min
CSEPED: 10 min
Estimated workload: 11.7 METS
Exercise duration (sec): 0 s
Peak HR: 179 {beats}/min
Percent HR: 101 %
RPE: 18
Rest HR: 69 {beats}/min

## 2016-12-25 LAB — COMPREHENSIVE METABOLIC PANEL
A/G RATIO: 2 (ref 1.2–2.2)
ALT: 24 IU/L (ref 0–44)
AST: 23 IU/L (ref 0–40)
Albumin: 4.7 g/dL (ref 3.5–5.5)
Alkaline Phosphatase: 81 IU/L (ref 39–117)
BILIRUBIN TOTAL: 1.1 mg/dL (ref 0.0–1.2)
BUN/Creatinine Ratio: 8 — ABNORMAL LOW (ref 9–20)
BUN: 8 mg/dL (ref 6–24)
CHLORIDE: 105 mmol/L (ref 96–106)
CO2: 19 mmol/L — ABNORMAL LOW (ref 20–29)
Calcium: 9.9 mg/dL (ref 8.7–10.2)
Creatinine, Ser: 1.05 mg/dL (ref 0.76–1.27)
GFR calc non Af Amer: 86 mL/min/{1.73_m2} (ref >59)
GFR, EST AFRICAN AMERICAN: 99 mL/min/{1.73_m2} (ref >59)
Globulin, Total: 2.4 g/dL (ref 1.5–4.5)
Glucose: 104 mg/dL — ABNORMAL HIGH (ref 65–99)
POTASSIUM: 4.6 mmol/L (ref 3.5–5.2)
SODIUM: 142 mmol/L (ref 134–144)
TOTAL PROTEIN: 7.1 g/dL (ref 6.0–8.5)

## 2016-12-25 LAB — LIPID PANEL
Chol/HDL Ratio: 4.2 ratio (ref 0.0–5.0)
Cholesterol, Total: 220 mg/dL — ABNORMAL HIGH (ref 100–199)
HDL: 52 mg/dL (ref >39)
LDL Calculated: 141 mg/dL — ABNORMAL HIGH (ref 0–99)
Triglycerides: 135 mg/dL (ref 0–149)
VLDL Cholesterol Cal: 27 mg/dL (ref 5–40)

## 2016-12-25 LAB — CBC
Hematocrit: 48.1 % (ref 37.5–51.0)
Hemoglobin: 17.4 g/dL (ref 13.0–17.7)
MCH: 33.5 pg — ABNORMAL HIGH (ref 26.6–33.0)
MCHC: 36.2 g/dL — ABNORMAL HIGH (ref 31.5–35.7)
MCV: 93 fL (ref 79–97)
PLATELETS: 219 10*3/uL (ref 150–379)
RBC: 5.19 x10E6/uL (ref 4.14–5.80)
RDW: 13.2 % (ref 12.3–15.4)
WBC: 4.9 10*3/uL (ref 3.4–10.8)

## 2016-12-25 LAB — TSH: TSH: 2.01 u[IU]/mL (ref 0.450–4.500)

## 2017-01-01 ENCOUNTER — Telehealth: Payer: Self-pay | Admitting: *Deleted

## 2017-01-01 NOTE — Telephone Encounter (Signed)
-----   Message from Lennette Biharihomas A Kelly, MD sent at 12/30/2016 12:07 AM EDT ----- Normal exercise treadmill test without evidence for ischemia or ECG changes, but with  diastolic hypertension with stress

## 2017-01-01 NOTE — Telephone Encounter (Signed)
LEFT MESSAGE TO CALL BACK IN REGARDS TO EXERCISE TREADMILL TEST- FOR DR Tresa EndoKELLY

## 2017-01-01 NOTE — Telephone Encounter (Signed)
New Message ° ° pt verbalized that she is returning call for rn °

## 2017-01-01 NOTE — Telephone Encounter (Signed)
Spoke to patient. Result given . Verbalized understanding Labs will be called once reviewed.

## 2017-01-07 ENCOUNTER — Telehealth: Payer: Self-pay | Admitting: Cardiovascular Disease

## 2017-01-07 ENCOUNTER — Other Ambulatory Visit: Payer: Self-pay | Admitting: Cardiovascular Disease

## 2017-01-07 DIAGNOSIS — E7800 Pure hypercholesterolemia, unspecified: Secondary | ICD-10-CM

## 2017-01-07 MED ORDER — ATORVASTATIN CALCIUM 20 MG PO TABS: 20.0000 mg | ORAL_TABLET | Freq: Every day | ORAL | 6 refills | Status: DC

## 2017-01-07 MED ORDER — ATENOLOL 25 MG PO TABS: 25.0000 mg | ORAL_TABLET | Freq: Two times a day (BID) | ORAL | 6 refills | Status: DC

## 2017-01-07 NOTE — Telephone Encounter (Signed)
Labs are mostly stable; glucose minimally increased at 104; lipids are elevated with a total cholesterol of 220 and LDL 141.  Consider initiating atorvastatin 20 mg.

## 2017-01-07 NOTE — Telephone Encounter (Signed)
Returned call to patient.He stated he would like lab results done 12/25/16.Stated he is going on vacation and would like results.Also needs Atenolol refilled.Atenolol refill sent to pharmacy.Advised lab results not available.Advised we will call back after Dr.Kelly reviews.

## 2017-01-07 NOTE — Telephone Encounter (Signed)
Medication Detail    Disp Refills Start End   atenolol (TENORMIN) 25 MG tablet 60 tablet 6 01/07/2017    Sig - Route: Take 1 tablet (25 mg total) by mouth 2 (two) times daily. - Oral   E-Prescribing Status: Receipt confirmed by pharmacy (01/07/2017 10:29 AM EDT)   Pharmacy   [facility] - [place], [place] - [id number] [address]

## 2017-01-07 NOTE — Telephone Encounter (Signed)
Returned call to patient.Dr.Kelly's recommendations given.Advised to have fasting lipid and hepatic panels in 3 months.Lab orders mailed.

## 2017-01-07 NOTE — Telephone Encounter (Signed)
°*  STAT* If patient is at the pharmacy, call can be transferred to refill team.   1. Which medications need to be refilled? (please list name of each medication and dose if known) Atenolol-need new prescription  2. Which pharmacy/location (including street and city if local pharmacy) is medication to be sent to?[place] [phone]-[phone]  3. Do they need a 30 day or 90 day supply? 60 and refills

## 2017-01-07 NOTE — Addendum Note (Signed)
Addended by: Neoma LamingPUGH, Lanecia Sliva J on: 01/07/2017 03:09 PM   Modules accepted: Orders

## 2017-01-07 NOTE — Telephone Encounter (Signed)
Pt would like his lab results from 12-25-16 please.

## 2017-07-29 ENCOUNTER — Ambulatory Visit: Payer: Self-pay | Admitting: Surgery

## 2017-07-29 NOTE — H&P (Signed)
History of Present Illness Joshua Simpson(Idamae Coccia K. Willem Klingensmith MD; 07/29/2017 12:21 PM) The patient is a 47 year old male who presents for evaluation of gall stones. This is a 47 year old male who presents with a 25 year history of intermittent digestion. These symptoms include epigastric pain, nausea, vomiting, frequent diarrhea. She also reports some increased gas bloating. He has undergone previous workup including EGD in 2011 that was unremarkable. He had a CT scan ultrasound last year that showed cholelithiasis but no sign of cholecystitis. Symptoms seem to be worsening. Last year he had a severe episode where the pain radiated through to his back and lasted for many hours. This was associated with nausea and diarrhea. He presents now to discuss cholecystectomy.  Liver function test in June 2018 were normal  CLINICAL DATA: Upper and mid abdominal pain extending to the right.  EXAM: CT ABDOMEN AND PELVIS WITH CONTRAST  TECHNIQUE: Multidetector CT imaging of the abdomen and pelvis was performed using the standard protocol following bolus administration of intravenous contrast.  CONTRAST: 100mL ISOVUE-300 IOPAMIDOL (ISOVUE-300) INJECTION 61%  COMPARISON: CT of the abdomen and pelvis 05/29/2010  FINDINGS: Lower chest: Minimal dependent atelectasis is present. Heart size is normal. No significant pleural or pericardial effusion is present.  Hepatobiliary: There is diffuse fatty infiltration of liver. 2 or 3 subcentimeter is cysts are evident. No other discrete lesions are present. Liver contour is within normal limits. The common bile duct is within normal limits. A focal density is present posteriorly within the gallbladder measuring 11 mm. This may represent a stone or polyp. Other small peripheral densities are present near the fundus, potentially additional stones or polyps.  Pancreas: Unremarkable. No pancreatic ductal dilatation or surrounding inflammatory changes.  Spleen: Normal  in size without focal abnormality.  Adrenals/Urinary Tract: The adrenal glands are normal bilaterally. The kidneys and ureters are within normal limits. The urinary bladder is unremarkable.  Stomach/Bowel: The stomach and duodenum are within normal limits. Small bowel is unremarkable. It is mostly collapsed. The appendix is visualized and normal. There is slight inflammatory change about the proximal ascending colon without a discrete mass lesion or abscess. There is no free air. The transverse colon is normal. Diverticular changes are present in the distal descending colon and throughout the sigmoid colon without focal inflammation to suggest diverticulitis.  Vascular/Lymphatic: No significant vascular findings are present. No enlarged abdominal or pelvic lymph nodes.  Reproductive: Calcifications are present within the prostate gland. Transverse diameter is 4.6 cm.  Other: No significant free fluid or free air is present.  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 Grade 1 anterolisthesis at L5-S1 and slight retrolisthesis at L4-5 are similar the prior study. No focal lytic or blastic lesions are present. The bony pelvis is intact.  IMPRESSION: 1. Slight inflammatory changes within the proximal ascending colon could represent a focal colitis. No focal mass lesion is present. There is no free air or abscess. 2. Scattered peripheral densities within the gallbladder. This most likely represents stones or polyps. Neoplasm is considered less likely. Right upper quadrant ultrasound would be useful for further evaluation as stones were not present on previous ultrasound studies. 3. Hepatic steatosis, a chronic finding. 4. Mild prominence of the prostate gland without focal lesion.   Electronically Signed By: Marin Robertshristopher Mattern M.D. On: 08/03/2016 10:27  CLINICAL DATA: Right upper quadrant pain.  EXAM: US ABDOMEN LIMITED - RIGHT UPPER  QUADRANT  COMPARISON: CT 08/03/2016.  FINDINGS: Gallbladder:  No gallstones or wall thickening visualized. No sonographic Eulah PontMurphy  sign noted by sonographer.  Common bile duct:  Diameter: 5.4 mm  Liver:  The liver is again noted to be echogenic consistent fatty infiltration. Focal small hypoechoic area noted adjacent the gallbladder fossa again noted. This is again consistent with focal fatty sparing.  IMPRESSION: Echogenic liver consistent fatty infiltration and/or hepatocellular disease. Area of focal fatty sparing again noted adjacent to the gallbladder fossa. Exam stable from prior exam.  Electronically Signed: By: Maisie Fus Register On: 08/03/2016 11:48  ADDENDUM: On review of this examination, gallstones are evident.  These results will be called to the ordering clinician or representative by the Radiologist Assistant, and communication documented in the PACS or zVision Dashboard.   Electronically Signed By: Bretta Bang III M.D. On: 08/06/2016 07:57   Past Surgical History (Tanisha A. Manson Passey, RMA; 07/29/2017 10:29 AM) Shoulder Surgery Right.  Diagnostic Studies History (Tanisha A. Manson Passey, RMA; 07/29/2017 10:29 AM) Colonoscopy never  Allergies (Tanisha A. Manson Passey, RMA; 07/29/2017 10:31 AM) No Known Drug Allergies [07/29/2017]: Allergies Reconciled  Medication History (Tanisha A. Manson Passey, RMA; 07/29/2017 10:31 AM) Atenolol (25MG  Tablet, Oral) Active. Lisinopril (10MG  Tablet, Oral) Active. ALPRAZolam (0.5MG  Tablet, Oral) Active. Medications Reconciled  Social History (Tanisha A. Manson Passey, RMA; 07/29/2017 10:29 AM) Alcohol use Occasional alcohol use. No caffeine use No drug use Tobacco use Never smoker.  Family History (Tanisha A. Manson Passey, RMA; 07/29/2017 10:29 AM) Diabetes Mellitus Father. Hypertension Father, Mother.  Other Problems (Tanisha A. Manson Passey, RMA; 07/29/2017 10:29 AM) Anxiety Disorder Chest pain Cholelithiasis Gastroesophageal  Reflux Disease High blood pressure     Review of Systems (Tanisha A. Brown RMA; 07/29/2017 10:29 AM) General Present- Night Sweats. Not Present- Appetite Loss, Chills, Fatigue, Fever, Weight Gain and Weight Loss. Skin Not Present- Change in Wart/Mole, Dryness, Hives, Jaundice, New Lesions, Non-Healing Wounds, Rash and Ulcer. HEENT Present- Seasonal Allergies and Sinus Pain. Not Present- Earache, Hearing Loss, Hoarseness, Nose Bleed, Oral Ulcers, Ringing in the Ears, Sore Throat, Visual Disturbances, Wears glasses/contact lenses and Yellow Eyes. Respiratory Present- Snoring. Not Present- Bloody sputum, Chronic Cough, Difficulty Breathing and Wheezing. Breast Not Present- Breast Mass, Breast Pain, Nipple Discharge and Skin Changes. Cardiovascular Present- Chest Pain, Palpitations, Rapid Heart Rate and Shortness of Breath. Not Present- Difficulty Breathing Lying Down, Leg Cramps and Swelling of Extremities. Gastrointestinal Present- Abdominal Pain, Bloating, Chronic diarrhea, Excessive gas, Gets full quickly at meals, Indigestion, Nausea and Vomiting. Not Present- Bloody Stool, Change in Bowel Habits, Constipation, Difficulty Swallowing, Hemorrhoids and Rectal Pain. Musculoskeletal Present- Back Pain. Not Present- Joint Pain, Joint Stiffness, Muscle Pain, Muscle Weakness and Swelling of Extremities. Neurological Not Present- Decreased Memory, Fainting, Headaches, Numbness, Seizures, Tingling, Tremor, Trouble walking and Weakness. Psychiatric Present- Anxiety. Not Present- Bipolar, Change in Sleep Pattern, Depression, Fearful and Frequent crying. Endocrine Not Present- Cold Intolerance, Excessive Hunger, Hair Changes, Heat Intolerance, Hot flashes and New Diabetes. Hematology Not Present- Blood Thinners, Easy Bruising, Excessive bleeding, Gland problems, HIV and Persistent Infections.  Vitals (Tanisha A. Brown RMA; 07/29/2017 10:31 AM) 07/29/2017 10:30 AM Weight: 275.6 lb Height: 72in Body  Surface Area: 2.44 m Body Mass Index: 37.38 kg/m  Temp.: 98.102F  Pulse: 71 (Regular)  BP: 124/86 (Sitting, Left Arm, Standard)      Physical Exam Molli Hazard K. Athena Baltz MD; 07/29/2017 12:22 PM)  The physical exam findings are as follows: Note:WDWN in NAD Eyes: Pupils equal, round; sclera anicteric HENT: Oral mucosa moist; good dentition Neck: No masses palpated, no thyromegaly Lungs: CTA bilaterally; normal respiratory effort CV: Regular rate and rhythm; no murmurs; extremities well-perfused  with no edema Abd: +bowel sounds, soft, mildly tender in epigastrium/ RUQ, no palpable organomegaly; no palpable hernias Skin: Warm, dry; no sign of jaundice Psychiatric - alert and oriented x 4; calm mood and affect    Assessment & Plan Molli Hazard K. Kiesha Ensey MD; 07/29/2017 12:22 PM)  CHRONIC CHOLECYSTITIS WITH CALCULUS (K80.10)  Current Plans Schedule for Surgery - Laparoscopic cholecystectomy with intraoperative cholangiogram. The surgical procedure has been discussed with the patient. Potential risks, benefits, alternative treatments, and expected outcomes have been explained. All of the patient's questions at this time have been answered. The likelihood of reaching the patient's treatment goal is good. The patient understand the proposed surgical procedure and wishes to proceed. Note:It appears that the patient also has some reflux symptoms. I told him that the cholecystectomy would likely relieve his abdominal pain but may not relieve all of his reflux symptoms. We will wait and see what changes he notices after surgery.  Joshua Simpson. Corliss Skains, MD, [facility]  General/ Trauma Surgery  07/29/2017 12:22 PM

## 2017-07-29 NOTE — H&P (View-Only) (Signed)
History of Present Illness Joshua Simpson(Joshua Simpson [name] MD; 07/29/2017 12:21 PM) The patient is a 47 year old male who presents for evaluation of gall stones. This is a 47 year old male who presents with a 25 year history of intermittent digestion. These symptoms include epigastric pain, nausea, vomiting, frequent diarrhea. She also reports some increased gas bloating. He has undergone previous workup including EGD in 2011 that was unremarkable. He had a CT scan ultrasound last year that showed cholelithiasis but no sign of cholecystitis. Symptoms seem to be worsening. Last year he had a severe episode where the pain radiated through to his back and lasted for many hours. This was associated with nausea and diarrhea. He presents now to discuss cholecystectomy.  Liver function test in June 2018 were normal  CLINICAL DATA: Upper and mid abdominal pain extending to the right.  EXAM: CT ABDOMEN AND PELVIS WITH CONTRAST  TECHNIQUE: Multidetector CT imaging of the abdomen and pelvis was performed using the standard protocol following bolus administration of intravenous contrast.  CONTRAST: 100mL ISOVUE-300 IOPAMIDOL (ISOVUE-300) INJECTION 61%  COMPARISON: CT of the abdomen and pelvis 05/29/2010  FINDINGS: Lower chest: Minimal dependent atelectasis is present. Heart size is normal. No significant pleural or pericardial effusion is present.  Hepatobiliary: There is diffuse fatty infiltration of liver. 2 or 3 subcentimeter is cysts are evident. No other discrete lesions are present. Liver contour is within normal limits. The common bile duct is within normal limits. A focal density is present posteriorly within the gallbladder measuring 11 mm. This may represent a stone or polyp. Other small peripheral densities are present near the fundus, potentially additional stones or polyps.  Pancreas: Unremarkable. No pancreatic ductal dilatation or surrounding inflammatory changes.  Spleen: Normal  in size without focal abnormality.  Adrenals/Urinary Tract: The adrenal glands are normal bilaterally. The kidneys and ureters are within normal limits. The urinary bladder is unremarkable.  Stomach/Bowel: The stomach and duodenum are within normal limits. Small bowel is unremarkable. It is mostly collapsed. The appendix is visualized and normal. There is slight inflammatory change about the proximal ascending colon without a discrete mass lesion or abscess. There is no free air. The transverse colon is normal. Diverticular changes are present in the distal descending colon and throughout the sigmoid colon without focal inflammation to suggest diverticulitis.  Vascular/Lymphatic: No significant vascular findings are present. No enlarged abdominal or pelvic lymph nodes.  Reproductive: Calcifications are present within the prostate gland. Transverse diameter is 4.6 cm.  Other: No significant free fluid or free air is present.  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 Grade 1 anterolisthesis at L5-S1 and slight retrolisthesis at L4-5 are similar the prior study. No focal lytic or blastic lesions are present. The bony pelvis is intact.  IMPRESSION: 1. Slight inflammatory changes within the proximal ascending colon could represent a focal colitis. No focal mass lesion is present. There is no free air or abscess. 2. Scattered peripheral densities within the gallbladder. This most likely represents stones or polyps. Neoplasm is considered less likely. Right upper quadrant ultrasound would be useful for further evaluation as stones were not present on previous ultrasound studies. 3. Hepatic steatosis, a chronic finding. 4. Mild prominence of the prostate gland without focal lesion.   Electronically Signed By: Marin Robertshristopher Mattern M.D. On: 08/03/2016 10:27  CLINICAL DATA: Right upper quadrant pain.  EXAM: US ABDOMEN LIMITED - RIGHT UPPER  QUADRANT  COMPARISON: CT 08/03/2016.  FINDINGS: Gallbladder:  No gallstones or wall thickening visualized. No sonographic Eulah PontMurphy  sign noted by sonographer.  Common bile duct:  Diameter: 5.4 mm  Liver:  The liver is again noted to be echogenic consistent fatty infiltration. Focal small hypoechoic area noted adjacent the gallbladder fossa again noted. This is again consistent with focal fatty sparing.  IMPRESSION: Echogenic liver consistent fatty infiltration and/or hepatocellular disease. Area of focal fatty sparing again noted adjacent to the gallbladder fossa. Exam stable from prior exam.  Electronically Signed: By: Maisie Fus Register On: 08/03/2016 11:48  ADDENDUM: On review of this examination, gallstones are evident.  These results will be called to the ordering clinician or representative by the Radiologist Assistant, and communication documented in the PACS or zVision Dashboard.   Electronically Signed By: Bretta Bang III M.D. On: 08/06/2016 07:57   Past Surgical History (Tanisha A. Manson Passey, RMA; 07/29/2017 10:29 AM) Shoulder Surgery Right.  Diagnostic Studies History (Tanisha A. Manson Passey, RMA; 07/29/2017 10:29 AM) Colonoscopy never  Allergies (Tanisha A. Manson Passey, RMA; 07/29/2017 10:31 AM) No Known Drug Allergies [07/29/2017]: Allergies Reconciled  Medication History (Tanisha A. Manson Passey, RMA; 07/29/2017 10:31 AM) Atenolol (25MG  Tablet, Oral) Active. Lisinopril (10MG  Tablet, Oral) Active. ALPRAZolam (0.5MG  Tablet, Oral) Active. Medications Reconciled  Social History (Tanisha A. Manson Passey, RMA; 07/29/2017 10:29 AM) Alcohol use Occasional alcohol use. No caffeine use No drug use Tobacco use Never smoker.  Family History (Tanisha A. Manson Passey, RMA; 07/29/2017 10:29 AM) Diabetes Mellitus Father. Hypertension Father, Mother.  Other Problems (Tanisha A. Manson Passey, RMA; 07/29/2017 10:29 AM) Anxiety Disorder Chest pain Cholelithiasis Gastroesophageal  Reflux Disease High blood pressure     Review of Systems (Tanisha A. Brown RMA; 07/29/2017 10:29 AM) General Present- Night Sweats. Not Present- Appetite Loss, Chills, Fatigue, Fever, Weight Gain and Weight Loss. Skin Not Present- Change in Wart/Mole, Dryness, Hives, Jaundice, New Lesions, Non-Healing Wounds, Rash and Ulcer. HEENT Present- Seasonal Allergies and Sinus Pain. Not Present- Earache, Hearing Loss, Hoarseness, Nose Bleed, Oral Ulcers, Ringing in the Ears, Sore Throat, Visual Disturbances, Wears glasses/contact lenses and Yellow Eyes. Respiratory Present- Snoring. Not Present- Bloody sputum, Chronic Cough, Difficulty Breathing and Wheezing. Breast Not Present- Breast Mass, Breast Pain, Nipple Discharge and Skin Changes. Cardiovascular Present- Chest Pain, Palpitations, Rapid Heart Rate and Shortness of Breath. Not Present- Difficulty Breathing Lying Down, Leg Cramps and Swelling of Extremities. Gastrointestinal Present- Abdominal Pain, Bloating, Chronic diarrhea, Excessive gas, Gets full quickly at meals, Indigestion, Nausea and Vomiting. Not Present- Bloody Stool, Change in Bowel Habits, Constipation, Difficulty Swallowing, Hemorrhoids and Rectal Pain. Musculoskeletal Present- Back Pain. Not Present- Joint Pain, Joint Stiffness, Muscle Pain, Muscle Weakness and Swelling of Extremities. Neurological Not Present- Decreased Memory, Fainting, Headaches, Numbness, Seizures, Tingling, Tremor, Trouble walking and Weakness. Psychiatric Present- Anxiety. Not Present- Bipolar, Change in Sleep Pattern, Depression, Fearful and Frequent crying. Endocrine Not Present- Cold Intolerance, Excessive Hunger, Hair Changes, Heat Intolerance, Hot flashes and New Diabetes. Hematology Not Present- Blood Thinners, Easy Bruising, Excessive bleeding, Gland problems, HIV and Persistent Infections.  Vitals (Tanisha A. Brown RMA; 07/29/2017 10:31 AM) 07/29/2017 10:30 AM Weight: 275.6 lb Height: 72in Body  Surface Area: 2.44 m Body Mass Index: 37.38 kg/m  Temp.: 98.102F  Pulse: 71 (Regular)  BP: 124/86 (Sitting, Left Arm, Standard)      Physical Exam Molli Hazard K. Karter Hellmer MD; 07/29/2017 12:22 PM)  The physical exam findings are as follows: Note:WDWN in NAD Eyes: Pupils equal, round; sclera anicteric HENT: Oral mucosa moist; good dentition Neck: No masses palpated, no thyromegaly Lungs: CTA bilaterally; normal respiratory effort CV: Regular rate and rhythm; no murmurs; extremities well-perfused  with no edema Abd: +bowel sounds, soft, mildly tender in epigastrium/ RUQ, no palpable organomegaly; no palpable hernias Skin: Warm, dry; no sign of jaundice Psychiatric - alert and oriented x 4; calm mood and affect    Assessment & Plan Molli Hazard K. Breckyn Troyer MD; 07/29/2017 12:22 PM)  CHRONIC CHOLECYSTITIS WITH CALCULUS (K80.10)  Current Plans Schedule for Surgery - Laparoscopic cholecystectomy with intraoperative cholangiogram. The surgical procedure has been discussed with the patient. Potential risks, benefits, alternative treatments, and expected outcomes have been explained. All of the patient's questions at this time have been answered. The likelihood of reaching the patient's treatment goal is good. The patient understand the proposed surgical procedure and wishes to proceed. Note:It appears that the patient also has some reflux symptoms. I told him that the cholecystectomy would likely relieve his abdominal pain but may not relieve all of his reflux symptoms. We will wait and see what changes he notices after surgery.  Joshua Simpson. Joshua Skains, MD, [facility]  General/ Trauma Surgery  07/29/2017 12:22 PM

## 2017-08-04 ENCOUNTER — Telehealth: Payer: Self-pay | Admitting: Surgery

## 2017-08-04 NOTE — Telephone Encounter (Signed)
Joshua Simpson  01/04/1971 [id number]  Patient Care Team: Dema SeverinYork, Regina F, NP as PCP - General Manus Ruddsuei, Matthew, MD as Consulting Physician (General Surgery)  This patient is a 47 y.o.male who calls today for surgical evaluation.   Reason for call: Another gallbladder attack that will not go away.  Worsening pain.  Wife concerned.  47 year old male with episodes of intermittent abdominal complaints suspicious for gallbladder problems.  Was seen by Tsueie with our group on 14 January.  Very suspicious for chronic cholecystitis.  Orders placed to schedule elective cholecystectomy.  Patient started having severe pain yesterday.  Another attack but this 1 was the worst.  It has persisted through the night.  Feeling nauseated.  Wife concerned.  He probably is developing acute cholecystitis since the attack has persisted this long..  I recommend he consider emergency room evaluation.  May require admission and urgent cholecystectomy tomorrow.  Census is higher at [facility], so more likely better [facility]  Dr. Corliss Skainssuei tends to be there anyway, but more likely will be a different partner that will evaluate and treat him if it becomes emergent as suspected.  They will think about things but are considering going to the emergency room.  Patient Active Problem List   Diagnosis Date Noted  . Palpitations 08/15/2015  . Obesity (BMI 30-39.9) 08/15/2015  . ABDOMINAL PAIN, LEFT UPPER QUADRANT 04/25/2010  . Anxiety state 04/18/2010  . Essential hypertension 04/18/2010  . PAIN IN JOINT, ANKLE AND FOOT 04/18/2010  . EPIGASTRIC PAIN 04/18/2010    Past Medical History:  Diagnosis Date  . Anxiety   . Hypertension     Past Surgical History:  Procedure Laterality Date  . SHOULDER SURGERY      Social History   Socioeconomic History  . Marital status: Single    Spouse name: Not on file  . Number of children: Not on file  . Years of education: Not on file  .  Highest education level: Not on file  Social Needs  . Financial resource strain: Not on file  . Food insecurity - worry: Not on file  . Food insecurity - inability: Not on file  . Transportation needs - medical: Not on file  . Transportation needs - non-medical: Not on file  Occupational History  . Not on file  Tobacco Use  . Smoking status: Never Smoker  . Smokeless tobacco: Never Used  Substance and Sexual Activity  . Alcohol use: No    Alcohol/week: 0.0 oz    Comment: occassionally  . Drug use: No  . Sexual activity: Not on file  Other Topics Concern  . Not on file  Social History Narrative  . Not on file    Family History  Problem Relation Age of Onset  . Lung cancer Father     Current Outpatient Medications  Medication Sig Dispense Refill  . atenolol (TENORMIN) 25 MG tablet Take 1 tablet (25 mg total) by mouth 2 (two) times daily. 60 tablet 6  . atorvastatin (LIPITOR) 20 MG tablet Take 1 tablet (20 mg total) by mouth daily. 30 tablet 6  . cetirizine (ZYRTEC) 10 MG tablet Take 10 mg by mouth daily.    Marland Kitchen. lisinopril (PRINIVIL,ZESTRIL) 10 MG tablet Take one-half tablet (5 mg) by mouth daily.    . Multiple Vitamin (MULTIVITAMIN WITH MINERALS) TABS tablet Take 1 tablet by mouth daily.    . ondansetron (ZOFRAN) 4 MG tablet Take 1 tablet (4 mg total) by  mouth every 8 (eight) hours as needed for nausea or vomiting. 11 tablet 0  . pantoprazole (PROTONIX) 40 MG tablet Take 40 mg by mouth as needed (GERD).     No current facility-administered medications for this visit.      Allergies  Allergen Reactions  . Trolamine Salicylate Rash    @VS @  No results found.  Note: This dictation was prepared with Dragon/digital dictation along with [employer]. Any transcriptional errors that result from this process are unintentional.   .Ardeth Sportsman, M.D., F.A.C.S. Gastrointestinal and Minimally Invasive Surgery [facility] [address],  [address] [place], [place] [address] [phone] Main / Paging  08/04/2017 11:50 AM

## 2017-08-13 ENCOUNTER — Encounter (HOSPITAL_COMMUNITY): Payer: Self-pay

## 2017-08-13 MED ORDER — DEXTROSE 5 % IV SOLN
3.0000 g | INTRAVENOUS | Status: AC
  Administered 2017-08-14: 3 g via INTRAVENOUS
  Filled 2017-08-13: qty 3

## 2017-08-14 ENCOUNTER — Encounter (HOSPITAL_COMMUNITY): Payer: Self-pay | Admitting: Surgery

## 2017-08-14 ENCOUNTER — Ambulatory Visit (HOSPITAL_COMMUNITY): Payer: BLUE CROSS/BLUE SHIELD | Admitting: Certified Registered"

## 2017-08-14 ENCOUNTER — Encounter (HOSPITAL_COMMUNITY)
Admission: RE | Disposition: A | Discharge status: Home / Self Care | Payer: Self-pay | Source: Ambulatory Visit | Attending: Surgery

## 2017-08-14 ENCOUNTER — Ambulatory Visit (HOSPITAL_COMMUNITY): Payer: BLUE CROSS/BLUE SHIELD

## 2017-08-14 ENCOUNTER — Other Ambulatory Visit: Payer: Self-pay

## 2017-08-14 ENCOUNTER — Observation Stay (HOSPITAL_COMMUNITY)
Admission: RE | Admit: 2017-08-14 | Discharge: 2017-08-15 | Disposition: A | Discharge status: Home / Self Care | Payer: BLUE CROSS/BLUE SHIELD | Source: Ambulatory Visit | Attending: Surgery | Admitting: Surgery

## 2017-08-14 DIAGNOSIS — K828 Other specified diseases of gallbladder: Secondary | ICD-10-CM | POA: Diagnosis not present

## 2017-08-14 DIAGNOSIS — Z8249 Family history of ischemic heart disease and other diseases of the circulatory system: Secondary | ICD-10-CM | POA: Insufficient documentation

## 2017-08-14 DIAGNOSIS — K760 Fatty (change of) liver, not elsewhere classified: Secondary | ICD-10-CM | POA: Insufficient documentation

## 2017-08-14 DIAGNOSIS — F419 Anxiety disorder, unspecified: Secondary | ICD-10-CM | POA: Diagnosis not present

## 2017-08-14 DIAGNOSIS — K529 Noninfective gastroenteritis and colitis, unspecified: Secondary | ICD-10-CM | POA: Insufficient documentation

## 2017-08-14 DIAGNOSIS — Z79899 Other long term (current) drug therapy: Secondary | ICD-10-CM | POA: Diagnosis not present

## 2017-08-14 DIAGNOSIS — J302 Other seasonal allergic rhinitis: Secondary | ICD-10-CM | POA: Insufficient documentation

## 2017-08-14 DIAGNOSIS — Z833 Family history of diabetes mellitus: Secondary | ICD-10-CM | POA: Diagnosis not present

## 2017-08-14 DIAGNOSIS — K8010 Calculus of gallbladder with chronic cholecystitis without obstruction: Principal | ICD-10-CM | POA: Insufficient documentation

## 2017-08-14 DIAGNOSIS — R079 Chest pain, unspecified: Secondary | ICD-10-CM | POA: Diagnosis not present

## 2017-08-14 DIAGNOSIS — Z419 Encounter for procedure for purposes other than remedying health state, unspecified: Secondary | ICD-10-CM

## 2017-08-14 DIAGNOSIS — I10 Essential (primary) hypertension: Secondary | ICD-10-CM | POA: Diagnosis not present

## 2017-08-14 DIAGNOSIS — K219 Gastro-esophageal reflux disease without esophagitis: Secondary | ICD-10-CM | POA: Diagnosis not present

## 2017-08-14 HISTORY — PX: CHOLECYSTECTOMY: SHX55

## 2017-08-14 LAB — CBC
HCT: 48.6 % (ref 39.0–52.0)
Hemoglobin: 17.3 g/dL — ABNORMAL HIGH (ref 13.0–17.0)
MCH: 32.8 pg (ref 26.0–34.0)
MCHC: 35.6 g/dL (ref 30.0–36.0)
MCV: 92.2 fL (ref 78.0–100.0)
Platelets: 218 10*3/uL (ref 150–400)
RBC: 5.27 MIL/uL (ref 4.22–5.81)
RDW: 12.2 % (ref 11.5–15.5)
WBC: 6.7 10*3/uL (ref 4.0–10.5)

## 2017-08-14 LAB — BASIC METABOLIC PANEL
Anion gap: 10 (ref 5–15)
BUN: 9 mg/dL (ref 6–20)
CALCIUM: 9.4 mg/dL (ref 8.9–10.3)
CO2: 23 mmol/L (ref 22–32)
Chloride: 107 mmol/L (ref 101–111)
Creatinine, Ser: 1.12 mg/dL (ref 0.61–1.24)
GFR calc non Af Amer: >60 mL/min (ref >60)
Glucose, Bld: 112 mg/dL — ABNORMAL HIGH (ref 65–99)
Potassium: 4.2 mmol/L (ref 3.5–5.1)
SODIUM: 140 mmol/L (ref 135–145)

## 2017-08-14 SURGERY — LAPAROSCOPIC CHOLECYSTECTOMY WITH INTRAOPERATIVE CHOLANGIOGRAM
Anesthesia: General

## 2017-08-14 MED ORDER — FENTANYL CITRATE (PF) 100 MCG/2ML IJ SOLN
INTRAMUSCULAR | Status: DC | PRN
  Administered 2017-08-14: 100 ug via INTRAVENOUS
  Administered 2017-08-14: 150 ug via INTRAVENOUS
  Administered 2017-08-14 (×2): 100 ug via INTRAVENOUS
  Administered 2017-08-14: 50 ug via INTRAVENOUS

## 2017-08-14 MED ORDER — BUPIVACAINE-EPINEPHRINE (PF) 0.5% -1:200000 IJ SOLN
INTRAMUSCULAR | Status: AC
  Filled 2017-08-14: qty 30

## 2017-08-14 MED ORDER — HYDROMORPHONE HCL 1 MG/ML IJ SOLN
0.2500 mg | INTRAMUSCULAR | Status: DC | PRN
  Administered 2017-08-14 (×4): 0.5 mg via INTRAVENOUS

## 2017-08-14 MED ORDER — CHLORHEXIDINE GLUCONATE CLOTH 2 % EX PADS: 6.0000 | MEDICATED_PAD | Freq: Once | CUTANEOUS | Status: DC

## 2017-08-14 MED ORDER — FENTANYL CITRATE (PF) 250 MCG/5ML IJ SOLN
INTRAMUSCULAR | Status: AC
  Filled 2017-08-14: qty 5

## 2017-08-14 MED ORDER — OXYCODONE HCL 5 MG PO TABS
5.0000 mg | ORAL_TABLET | ORAL | Status: DC | PRN
  Administered 2017-08-15: 10 mg via ORAL
  Filled 2017-08-14: qty 2

## 2017-08-14 MED ORDER — SUGAMMADEX SODIUM 200 MG/2ML IV SOLN
INTRAVENOUS | Status: AC
  Filled 2017-08-14: qty 2

## 2017-08-14 MED ORDER — ONDANSETRON HCL 4 MG/2ML IJ SOLN
INTRAMUSCULAR | Status: DC | PRN
  Administered 2017-08-14: 4 mg via INTRAVENOUS

## 2017-08-14 MED ORDER — ONDANSETRON HCL 4 MG/2ML IJ SOLN
INTRAMUSCULAR | Status: AC
  Filled 2017-08-14: qty 2

## 2017-08-14 MED ORDER — ONDANSETRON 4 MG PO TBDP
4.0000 mg | ORAL_TABLET | Freq: Four times a day (QID) | ORAL | Status: DC | PRN
  Administered 2017-08-15 (×2): 4 mg via ORAL
  Filled 2017-08-14 (×2): qty 1

## 2017-08-14 MED ORDER — HYDROMORPHONE HCL 1 MG/ML IJ SOLN
INTRAMUSCULAR | Status: AC
  Administered 2017-08-14: 0.5 mg via INTRAVENOUS
  Filled 2017-08-14: qty 1

## 2017-08-14 MED ORDER — ACETAMINOPHEN 500 MG PO TABS
1000.0000 mg | ORAL_TABLET | Freq: Once | ORAL | Status: AC
  Administered 2017-08-14: 1000 mg via ORAL

## 2017-08-14 MED ORDER — LISINOPRIL 5 MG PO TABS
5.0000 mg | ORAL_TABLET | Freq: Every day | ORAL | Status: DC
  Administered 2017-08-15: 5 mg via ORAL
  Filled 2017-08-14: qty 1

## 2017-08-14 MED ORDER — ATENOLOL 25 MG PO TABS
25.0000 mg | ORAL_TABLET | Freq: Every day | ORAL | Status: DC
  Administered 2017-08-15: 25 mg via ORAL
  Filled 2017-08-14: qty 1

## 2017-08-14 MED ORDER — POLYETHYLENE GLYCOL 3350 17 G PO PACK: 17.0000 g | PACK | Freq: Every day | ORAL | Status: DC | PRN

## 2017-08-14 MED ORDER — ENOXAPARIN SODIUM 40 MG/0.4ML ~~LOC~~ SOLN
40.0000 mg | SUBCUTANEOUS | Status: DC
  Filled 2017-08-14: qty 0.4

## 2017-08-14 MED ORDER — PANTOPRAZOLE SODIUM 40 MG PO TBEC: 40.0000 mg | DELAYED_RELEASE_TABLET | Freq: Every day | ORAL | Status: DC | PRN

## 2017-08-14 MED ORDER — HYDROCODONE-ACETAMINOPHEN 7.5-325 MG PO TABS
ORAL_TABLET | ORAL | Status: AC
  Filled 2017-08-14: qty 1

## 2017-08-14 MED ORDER — LACTATED RINGERS IV SOLN
INTRAVENOUS | Status: DC
  Administered 2017-08-14 (×2): via INTRAVENOUS

## 2017-08-14 MED ORDER — LIDOCAINE 2% (20 MG/ML) 5 ML SYRINGE
INTRAMUSCULAR | Status: AC
  Filled 2017-08-14: qty 5

## 2017-08-14 MED ORDER — CLONIDINE HCL 0.2 MG PO TABS
0.2000 mg | ORAL_TABLET | Freq: Once | ORAL | Status: AC
  Administered 2017-08-14: 0.2 mg via ORAL

## 2017-08-14 MED ORDER — EPHEDRINE SULFATE-NACL 50-0.9 MG/10ML-% IV SOSY
PREFILLED_SYRINGE | INTRAVENOUS | Status: DC | PRN
  Administered 2017-08-14: 10 mg via INTRAVENOUS

## 2017-08-14 MED ORDER — ALPRAZOLAM 0.5 MG PO TABS
0.5000 mg | ORAL_TABLET | Freq: Two times a day (BID) | ORAL | Status: DC
  Administered 2017-08-14 – 2017-08-15 (×2): 0.5 mg via ORAL
  Filled 2017-08-14 (×2): qty 1

## 2017-08-14 MED ORDER — PROMETHAZINE HCL 25 MG/ML IJ SOLN
INTRAMUSCULAR | Status: AC
  Filled 2017-08-14: qty 1

## 2017-08-14 MED ORDER — GABAPENTIN 300 MG PO CAPS: 300.0000 mg | ORAL_CAPSULE | Freq: Once | ORAL | Status: DC

## 2017-08-14 MED ORDER — 0.9 % SODIUM CHLORIDE (POUR BTL) OPTIME
TOPICAL | Status: DC | PRN
Start: 2017-08-14 — End: 2017-08-14
  Administered 2017-08-14: 1000 mL

## 2017-08-14 MED ORDER — MIDAZOLAM HCL 2 MG/2ML IJ SOLN
INTRAMUSCULAR | Status: AC
  Filled 2017-08-14: qty 2

## 2017-08-14 MED ORDER — IOPAMIDOL (ISOVUE-300) INJECTION 61%
INTRAVENOUS | Status: AC
  Filled 2017-08-14: qty 50

## 2017-08-14 MED ORDER — ONDANSETRON HCL 4 MG/2ML IJ SOLN
4.0000 mg | Freq: Four times a day (QID) | INTRAMUSCULAR | Status: DC | PRN
  Administered 2017-08-14 (×2): 4 mg via INTRAVENOUS
  Filled 2017-08-14: qty 2

## 2017-08-14 MED ORDER — MEPERIDINE HCL 25 MG/ML IJ SOLN: 6.2500 mg | INTRAMUSCULAR | Status: DC | PRN

## 2017-08-14 MED ORDER — POTASSIUM CHLORIDE IN NACL 20-0.9 MEQ/L-% IV SOLN
INTRAVENOUS | Status: DC
  Administered 2017-08-14: 20:00:00 via INTRAVENOUS
  Filled 2017-08-14: qty 1000

## 2017-08-14 MED ORDER — LIDOCAINE 2% (20 MG/ML) 5 ML SYRINGE
INTRAMUSCULAR | Status: DC | PRN
  Administered 2017-08-14: 100 mg via INTRAVENOUS

## 2017-08-14 MED ORDER — PROMETHAZINE HCL 25 MG/ML IJ SOLN
6.2500 mg | INTRAMUSCULAR | Status: DC | PRN
  Administered 2017-08-14: 12.5 mg via INTRAVENOUS

## 2017-08-14 MED ORDER — HYDROMORPHONE HCL 1 MG/ML IJ SOLN
INTRAMUSCULAR | Status: AC
  Filled 2017-08-14: qty 1

## 2017-08-14 MED ORDER — BUPIVACAINE-EPINEPHRINE 0.5% -1:200000 IJ SOLN
INTRAMUSCULAR | Status: DC | PRN
Start: 2017-08-14 — End: 2017-08-14
  Administered 2017-08-14: 13 mL

## 2017-08-14 MED ORDER — PROPOFOL 10 MG/ML IV BOLUS
INTRAVENOUS | Status: DC | PRN
  Administered 2017-08-14: 200 mg via INTRAVENOUS

## 2017-08-14 MED ORDER — KETOROLAC TROMETHAMINE 30 MG/ML IJ SOLN
30.0000 mg | Freq: Four times a day (QID) | INTRAMUSCULAR | Status: DC
  Administered 2017-08-14: 30 mg via INTRAVENOUS
  Filled 2017-08-14: qty 1

## 2017-08-14 MED ORDER — DIPHENHYDRAMINE HCL 50 MG/ML IJ SOLN: 25.0000 mg | Freq: Four times a day (QID) | INTRAMUSCULAR | Status: DC | PRN

## 2017-08-14 MED ORDER — ACETAMINOPHEN 500 MG PO TABS
ORAL_TABLET | ORAL | Status: AC
  Filled 2017-08-14: qty 2

## 2017-08-14 MED ORDER — MIDAZOLAM HCL 5 MG/5ML IJ SOLN
INTRAMUSCULAR | Status: DC | PRN
  Administered 2017-08-14: 2 mg via INTRAVENOUS

## 2017-08-14 MED ORDER — ACETAMINOPHEN 10 MG/ML IV SOLN: 1000.0000 mg | Freq: Once | INTRAVENOUS | Status: DC | PRN

## 2017-08-14 MED ORDER — HYDROCODONE-ACETAMINOPHEN 7.5-325 MG PO TABS
1.0000 | ORAL_TABLET | Freq: Once | ORAL | Status: AC | PRN
  Administered 2017-08-14: 1 via ORAL

## 2017-08-14 MED ORDER — DIPHENHYDRAMINE HCL 25 MG PO CAPS: 25.0000 mg | ORAL_CAPSULE | Freq: Four times a day (QID) | ORAL | Status: DC | PRN

## 2017-08-14 MED ORDER — ROCURONIUM BROMIDE 10 MG/ML (PF) SYRINGE
PREFILLED_SYRINGE | INTRAVENOUS | Status: DC | PRN
  Administered 2017-08-14: 10 mg via INTRAVENOUS
  Administered 2017-08-14: 50 mg via INTRAVENOUS

## 2017-08-14 MED ORDER — METHOCARBAMOL 500 MG PO TABS: 500.0000 mg | ORAL_TABLET | Freq: Four times a day (QID) | ORAL | Status: DC | PRN

## 2017-08-14 MED ORDER — PROPOFOL 10 MG/ML IV BOLUS
INTRAVENOUS | Status: AC
  Filled 2017-08-14: qty 20

## 2017-08-14 MED ORDER — DEXAMETHASONE SODIUM PHOSPHATE 10 MG/ML IJ SOLN
INTRAMUSCULAR | Status: AC
  Filled 2017-08-14: qty 1

## 2017-08-14 MED ORDER — CLONIDINE HCL 0.2 MG PO TABS
ORAL_TABLET | ORAL | Status: AC
  Filled 2017-08-14: qty 1

## 2017-08-14 MED ORDER — FENTANYL CITRATE (PF) 250 MCG/5ML IJ SOLN
INTRAMUSCULAR | Status: AC
Start: 2017-08-14 — End: 2017-08-14
  Filled 2017-08-14: qty 5

## 2017-08-14 MED ORDER — ATENOLOL 25 MG PO TABS
12.5000 mg | ORAL_TABLET | Freq: Every day | ORAL | Status: DC
  Administered 2017-08-14: 12.5 mg via ORAL
  Filled 2017-08-14: qty 1

## 2017-08-14 MED ORDER — GABAPENTIN 300 MG PO CAPS
ORAL_CAPSULE | ORAL | Status: AC
  Filled 2017-08-14: qty 1

## 2017-08-14 MED ORDER — DEXAMETHASONE SODIUM PHOSPHATE 10 MG/ML IJ SOLN
INTRAMUSCULAR | Status: DC | PRN
  Administered 2017-08-14: 10 mg via INTRAVENOUS

## 2017-08-14 MED ORDER — SODIUM CHLORIDE 0.9 % IV SOLN
INTRAVENOUS | Status: DC | PRN
  Administered 2017-08-14: 6 mL

## 2017-08-14 MED ORDER — ACETAMINOPHEN 325 MG PO TABS: 650.0000 mg | ORAL_TABLET | Freq: Four times a day (QID) | ORAL | Status: DC | PRN

## 2017-08-14 MED ORDER — ACETAMINOPHEN 650 MG RE SUPP: 650.0000 mg | Freq: Four times a day (QID) | RECTAL | Status: DC | PRN

## 2017-08-14 MED ORDER — OXYCODONE HCL 5 MG PO TABS: 5.0000 mg | ORAL_TABLET | Freq: Four times a day (QID) | ORAL | 0 refills | Status: DC | PRN

## 2017-08-14 MED ORDER — HYDROMORPHONE HCL 1 MG/ML IJ SOLN
1.0000 mg | INTRAMUSCULAR | Status: DC | PRN
  Administered 2017-08-14 – 2017-08-15 (×2): 1 mg via INTRAVENOUS
  Filled 2017-08-14 (×2): qty 1

## 2017-08-14 MED ORDER — SUGAMMADEX SODIUM 200 MG/2ML IV SOLN
INTRAVENOUS | Status: DC | PRN
  Administered 2017-08-14: 100 mg via INTRAVENOUS

## 2017-08-14 MED ORDER — KETOROLAC TROMETHAMINE 30 MG/ML IJ SOLN
30.0000 mg | Freq: Once | INTRAMUSCULAR | Status: AC
  Administered 2017-08-14: 30 mg via INTRAVENOUS

## 2017-08-14 MED ORDER — ROCURONIUM BROMIDE 10 MG/ML (PF) SYRINGE
PREFILLED_SYRINGE | INTRAVENOUS | Status: AC
  Filled 2017-08-14: qty 5

## 2017-08-14 MED ORDER — KETOROLAC TROMETHAMINE 30 MG/ML IJ SOLN
INTRAMUSCULAR | Status: AC
  Filled 2017-08-14: qty 1

## 2017-08-14 SURGICAL SUPPLY — 45 items
APL SKNCLS STERI-STRIP NONHPOA (GAUZE/BANDAGES/DRESSINGS) ×1
APPLIER CLIP ROT 10 11.4 M/L (STAPLE) ×3
APR CLP MED LRG 11.4X10 (STAPLE) ×1
BAG SPEC RTRVL LRG 6X4 10 (ENDOMECHANICALS) ×1
BENZOIN TINCTURE PRP APPL 2/3 (GAUZE/BANDAGES/DRESSINGS) ×3 IMPLANT
BLADE CLIPPER SURG (BLADE) IMPLANT
CANISTER SUCT 3000ML PPV (MISCELLANEOUS) ×3 IMPLANT
CHLORAPREP W/TINT 26ML (MISCELLANEOUS) ×3 IMPLANT
CLIP APPLIE ROT 10 11.4 M/L (STAPLE) ×1 IMPLANT
CLOSURE WOUND 1/2 X4 (GAUZE/BANDAGES/DRESSINGS) ×1
COVER MAYO STAND STRL (DRAPES) ×3 IMPLANT
COVER SURGICAL LIGHT HANDLE (MISCELLANEOUS) ×3 IMPLANT
DRAPE C-ARM 42X72 X-RAY (DRAPES) ×3 IMPLANT
DRSG TEGADERM 2-3/8X2-3/4 SM (GAUZE/BANDAGES/DRESSINGS) ×9 IMPLANT
DRSG TEGADERM 4X4.75 (GAUZE/BANDAGES/DRESSINGS) ×3 IMPLANT
ELECT REM PT RETURN 9FT ADLT (ELECTROSURGICAL) ×3
ELECTRODE REM PT RTRN 9FT ADLT (ELECTROSURGICAL) ×1 IMPLANT
FILTER SMOKE EVAC LAPAROSHD (FILTER) ×3 IMPLANT
GAUZE SPONGE 2X2 8PLY STRL LF (GAUZE/BANDAGES/DRESSINGS) ×1 IMPLANT
GLOVE BIO SURGEON STRL SZ7 (GLOVE) ×3 IMPLANT
GLOVE BIOGEL PI IND STRL 7.5 (GLOVE) ×1 IMPLANT
GLOVE BIOGEL PI INDICATOR 7.5 (GLOVE) ×2
GOWN STRL REUS W/ TWL LRG LVL3 (GOWN DISPOSABLE) ×3 IMPLANT
GOWN STRL REUS W/TWL LRG LVL3 (GOWN DISPOSABLE) ×9
KIT BASIN OR (CUSTOM PROCEDURE TRAY) ×3 IMPLANT
KIT ROOM TURNOVER OR (KITS) ×3 IMPLANT
NS IRRIG 1000ML POUR BTL (IV SOLUTION) ×3 IMPLANT
PAD ARMBOARD 7.5X6 YLW CONV (MISCELLANEOUS) ×3 IMPLANT
POUCH SPECIMEN RETRIEVAL 10MM (ENDOMECHANICALS) ×3 IMPLANT
SCISSORS LAP 5X35 DISP (ENDOMECHANICALS) ×3 IMPLANT
SET CHOLANGIOGRAPH 5 50 .035 (SET/KITS/TRAYS/PACK) ×3 IMPLANT
SET IRRIG TUBING LAPAROSCOPIC (IRRIGATION / IRRIGATOR) ×3 IMPLANT
SLEEVE ENDOPATH XCEL 5M (ENDOMECHANICALS) ×3 IMPLANT
SPECIMEN JAR SMALL (MISCELLANEOUS) ×3 IMPLANT
SPONGE GAUZE 2X2 STER 10/PKG (GAUZE/BANDAGES/DRESSINGS) ×2
STRIP CLOSURE SKIN 1/2X4 (GAUZE/BANDAGES/DRESSINGS) ×2 IMPLANT
SUT MNCRL AB 4-0 PS2 18 (SUTURE) ×3 IMPLANT
TOWEL OR 17X24 6PK STRL BLUE (TOWEL DISPOSABLE) ×3 IMPLANT
TOWEL OR 17X26 10 PK STRL BLUE (TOWEL DISPOSABLE) ×3 IMPLANT
TRAY LAPAROSCOPIC MC (CUSTOM PROCEDURE TRAY) ×3 IMPLANT
TROCAR XCEL BLUNT TIP 100MML (ENDOMECHANICALS) ×3 IMPLANT
TROCAR XCEL NON-BLD 11X100MML (ENDOMECHANICALS) ×3 IMPLANT
TROCAR XCEL NON-BLD 5MMX100MML (ENDOMECHANICALS) ×3 IMPLANT
TUBING INSUFFLATION (TUBING) ×3 IMPLANT
WATER STERILE IRR 1000ML POUR (IV SOLUTION) ×3 IMPLANT

## 2017-08-14 NOTE — Interval H&P Note (Signed)
History and Physical Interval Note:  08/14/2017 11:57 AM  Joshua Simpson  has presented today for surgery, with the diagnosis of Chronic calculus cholecystitis  The various methods of treatment have been discussed with the patient and family. After consideration of risks, benefits and other options for treatment, the patient has consented to  Procedure(s): LAPAROSCOPIC CHOLECYSTECTOMY WITH INTRAOPERATIVE CHOLANGIOGRAM (N/A) as a surgical intervention .  The patient's history has been reviewed, patient examined, no change in status, stable for surgery.  I have reviewed the patient's chart and labs.  Questions were answered to the patient's satisfaction.     Wynona LunaMatthew K Jacqueline Delapena

## 2017-08-14 NOTE — Discharge Instructions (Signed)
[facility] °LAPAROSCOPIC SURGERY: POST OP INSTRUCTIONS °Always review your discharge instruction sheet given to you by the facility where your surgery was performed. °IF YOU HAVE DISABILITY OR FAMILY LEAVE FORMS, YOU MUST BRING THEM TO THE OFFICE FOR PROCESSING.   °DO NOT GIVE THEM TO YOUR DOCTOR. ° °1. A prescription for pain medication will be given to you upon discharge.  Take your pain medication as prescribed, if needed.  If narcotic pain medicine is not needed, then you may take acetaminophen (Tylenol) or ibuprofen (Advil) as needed. °2. Take your usually prescribed medications unless otherwise directed. °3. If you need a refill on your pain medication, please contact your pharmacy.  They will contact our office to request authorization. Prescriptions will not be filled after 5pm or on week-ends. °4. You should follow a light diet the first few days after arrival home, such as soup and crackers, etc.  Be sure to include lots of fluids daily. °5. Most patients will experience some swelling and bruising in the area of the incisions.  Ice packs will help.  Swelling and bruising can take several days to resolve.  °6. It is common to experience some constipation if taking pain medication after surgery.  Increasing fluid intake and taking a stool softener (such as Colace) will usually help or prevent this problem from occurring.  A mild laxative (Milk of Magnesia or Miralax) should be taken according to package instructions if there are no bowel movements after 48 hours. °7. Unless discharge instructions indicate otherwise, you may remove your bandages 48 hours after surgery, and you may shower at that time.  You will have steri-strips (small skin tapes) in place directly over the incision.  These strips should be left on the skin for 7-10 days.  If your surgeon used skin glue on the incision, you may shower in 24 hours.  The glue will flake off over the next 2-3 weeks.  Any sutures or staples  will be removed at the office during your follow-up visit. °8. ACTIVITIES:  You may resume regular (light) daily activities beginning the next day--such as daily self-care, walking, climbing stairs--gradually increasing activities as tolerated.  You may have sexual intercourse when it is comfortable.  Refrain from any heavy lifting or straining until approved by your doctor. °a. You may drive when you are no longer taking prescription pain medication, you can comfortably wear a seatbelt, and you can safely maneuver your car and apply brakes. °b. RETURN TO WORK:   2-3 weeks °9. You should see your doctor in the office for a follow-up appointment approximately 2-3 weeks after your surgery.  Make sure that you call for this appointment within a day or two after you arrive home to insure a convenient appointment time. °10. OTHER INSTRUCTIONS: ________________________________________________________________________ °WHEN TO CALL YOUR DOCTOR: °1. Fever over 101.0 °2. Inability to urinate °3. Continued bleeding from incision. °4. Increased pain, redness, or drainage from the incision. °5. Increasing abdominal pain ° °The clinic staff is available to answer your questions during regular business hours.  Please don’t hesitate to call and ask to speak to one of the nurses for clinical concerns.  If you have a medical emergency, go to the nearest emergency room or call 911.  A surgeon from [facility] is always on call at the hospital. °[address], [address], [place], [place]  [address] ? [address] [address], [place], [place]   [address] °[phone] ? [phone] ? FAX [phone] °Web site:   [email or web address] ° °

## 2017-08-14 NOTE — Transfer of Care (Signed)
Immediate Anesthesia Transfer of Care Note  Patient: Joshua Simpson  Procedure(s) Performed: LAPAROSCOPIC CHOLECYSTECTOMY WITH INTRAOPERATIVE CHOLANGIOGRAM (N/A )  Patient Location: PACU  Anesthesia Type:General  Level of Consciousness: awake, oriented and patient cooperative  Airway & Oxygen Therapy: Patient Spontanous Breathing and Patient connected to nasal cannula oxygen  Post-op Assessment: Report given to RN, Post -op Vital signs reviewed and stable and Patient moving all extremities  Post vital signs: Reviewed and stable  Last Vitals:  Vitals:   08/14/17 1010 08/14/17 1340  BP: 124/86 (!) 135/91  Pulse: 67 91  Resp: 18 17  Temp: 36.7 C 36.8 C  SpO2: 95% 92%    Last Pain:  Vitals:   08/14/17 1010  TempSrc: Oral      Patients Stated Pain Goal: 3 (08/14/17 1029)  Complications: No apparent anesthesia complications

## 2017-08-14 NOTE — Anesthesia Preprocedure Evaluation (Signed)
Anesthesia Evaluation  Patient identified by MRN, date of birth, ID band Patient awake    Reviewed: Allergy & Precautions, NPO status , Patient's Chart, lab work & pertinent test results  Airway Mallampati: II  TM Distance: >3 FB Neck ROM: Full    Dental no notable dental hx.    Pulmonary neg pulmonary ROS,    Pulmonary exam normal breath sounds clear to auscultation       Cardiovascular hypertension, negative cardio ROS Normal cardiovascular exam Rhythm:Regular Rate:Normal     Neuro/Psych Anxiety negative neurological ROS  negative psych ROS   GI/Hepatic negative GI ROS, Neg liver ROS,   Endo/Other  negative endocrine ROS  Renal/GU negative Renal ROS  negative genitourinary   Musculoskeletal negative musculoskeletal ROS (+)   Abdominal   Peds  Hematology negative hematology ROS (+)   Anesthesia Other Findings   Reproductive/Obstetrics                             Lab Results  Component Value Date   WBC 6.7 08/14/2017   HGB 17.3 (H) 08/14/2017   HCT 48.6 08/14/2017   MCV 92.2 08/14/2017   PLT 218 08/14/2017   Lab Results  Component Value Date   CREATININE 1.12 08/14/2017   BUN 9 08/14/2017   NA 140 08/14/2017   K 4.2 08/14/2017   CL 107 08/14/2017   CO2 23 08/14/2017    Anesthesia Physical Anesthesia Plan  ASA: II  Anesthesia Plan: General   Post-op Pain Management:    Induction: Intravenous  PONV Risk Score and Plan: Treatment may vary due to age or medical condition and Ondansetron  Airway Management Planned: Oral ETT  Additional Equipment:   Intra-op Plan:   Post-operative Plan: Extubation in OR  Informed Consent: I have reviewed the patients History and Physical, chart, labs and discussed the procedure including the risks, benefits and alternatives for the proposed anesthesia with the patient or authorized representative who has indicated his/her  understanding and acceptance.   Dental advisory given  Plan Discussed with: CRNA and Anesthesiologist  Anesthesia Plan Comments:         Anesthesia Quick Evaluation

## 2017-08-14 NOTE — Anesthesia Postprocedure Evaluation (Signed)
Anesthesia Post Note  Patient: Joshua Simpson  Procedure(s) Performed: LAPAROSCOPIC CHOLECYSTECTOMY WITH INTRAOPERATIVE CHOLANGIOGRAM (N/A )     Patient location during evaluation: PACU Anesthesia Type: General Level of consciousness: awake and alert Pain management: pain level controlled Vital Signs Assessment: post-procedure vital signs reviewed and stable Respiratory status: spontaneous breathing, nonlabored ventilation, respiratory function stable and patient connected to nasal cannula oxygen Cardiovascular status: blood pressure returned to baseline and stable Postop Assessment: no apparent nausea or vomiting Anesthetic complications: no    Last Vitals:  Vitals:   08/14/17 1440 08/14/17 1445  BP: 101/61   Pulse: 66 66  Resp: (!) 32 18  Temp:    SpO2: 95% (!) 87%    Last Pain:  Vitals:   08/14/17 1534  TempSrc:   PainSc: 5                  Trevor IhaStephen A Rosemae Mcquown

## 2017-08-14 NOTE — Progress Notes (Signed)
Dr. Corliss Skainssuei paged and returned the call , update given- patient still having a lot of pain, nausea. Dr. Corliss Skainssuei will admit patient for observation.

## 2017-08-14 NOTE — Op Note (Signed)
Laparoscopic Cholecystectomy with IOC Procedure Note  Indications: This is a 47 year old male who presents with a 25 year history of intermittent digestion. These symptoms include epigastric pain, nausea, vomiting, frequent diarrhea. She also reports some increased gas bloating. He has undergone previous workup including EGD in 2011 that was unremarkable. He had a CT scan and ultrasound last year that showed cholelithiasis but no sign of cholecystitis. Symptoms seem to be worsening. Last year he had a severe episode where the pain radiated through to his back and lasted for many hours. This was associated with nausea and diarrhea. He presents now to discuss cholecystectomy.  Liver function test in June 2018 were normal    Pre-operative Diagnosis: Calculus of gallbladder with other cholecystitis, without mention of obstruction  Post-operative Diagnosis: Same  Surgeon: Wynona Luna   Assistants: none  Anesthesia: General endotracheal anesthesia  ASA Class: 1  Procedure Details  The patient was seen again in the Holding Room. The risks, benefits, complications, treatment options, and expected outcomes were discussed with the patient. The possibilities of reaction to medication, pulmonary aspiration, perforation of viscus, bleeding, recurrent infection, finding a normal gallbladder, the need for additional procedures, failure to diagnose a condition, the possible need to convert to an open procedure, and creating a complication requiring transfusion or operation were discussed with the patient. The likelihood of improving the patient's symptoms with return to their baseline status is good.  The patient and/or family concurred with the proposed plan, giving informed consent. The site of surgery properly noted. The patient was taken to Operating Room, identified as [place] [facility]aks and the procedure verified as Laparoscopic Cholecystectomy with Intraoperative Cholangiogram. A Time Out was  held and the above information confirmed.  Prior to the induction of general anesthesia, antibiotic prophylaxis was administered. General endotracheal anesthesia was then administered and tolerated well. After the induction, the abdomen was prepped with Chloraprep and draped in the sterile fashion. The patient was positioned in the supine position.  Local anesthetic agent was injected into the skin near the umbilicus and an incision made. We dissected down to the abdominal fascia with blunt dissection.  The fascia was incised vertically and we entered the peritoneal cavity bluntly.  A pursestring suture of 0-Vicryl was placed around the fascial opening.  The Hasson cannula was inserted and secured with the stay suture.  Pneumoperitoneum was then created with CO2 and tolerated well without any adverse changes in the patient's vital signs. An 11-mm port was placed in the subxiphoid position.  Two 5-mm ports were placed in the right upper quadrant. All skin incisions were infiltrated with a local anesthetic agent before making the incision and placing the trocars.   We positioned the patient in reverse Trendelenburg, tilted slightly to the patient's left.  The gallbladder was identified, the fundus grasped and retracted cephalad. The gallbladder showed chronic scarring but minimal adhesions.  Adhesions were lysed bluntly and with the electrocautery where indicated, taking care not to injure any adjacent organs or viscus. The infundibulum was grasped and retracted laterally, exposing the peritoneum overlying the triangle of Calot. This was then divided and exposed in a blunt fashion. A critical view of the cystic duct and cystic artery was obtained.  The cystic duct was clearly identified and bluntly dissected circumferentially. The cystic duct was ligated with a clip distally.   An incision was made in the cystic duct and the [facility] cholangiogram catheter introduced. The catheter was secured using a clip. A  cholangiogram was then  obtained which showed good visualization of the distal and proximal biliary tree with no sign of filling defects or obstruction.  Contrast flowed easily into the duodenum. The catheter was then removed.   The cystic duct was then ligated with clips and divided. The cystic artery was identified, dissected free, ligated with clips and divided as well.   The gallbladder was dissected from the liver bed in retrograde fashion with the electrocautery. The gallbladder was removed and placed in an Endocatch sac. The liver bed was irrigated and inspected. Hemostasis was achieved with the electrocautery. Copious irrigation was utilized and was repeatedly aspirated until clear.  The gallbladder and Endocatch sac were then removed through the umbilical port site.  The pursestring suture was used to close the umbilical fascia.    We again inspected the right upper quadrant for hemostasis.  Pneumoperitoneum was released as we removed the trocars.  4-0 Monocryl was used to close the skin.   Benzoin, steri-strips, and clean dressings were applied. The patient was then extubated and brought to the recovery room in stable condition. Instrument, sponge, and needle counts were correct at closure and at the conclusion of the case.   Findings: Cholecystitis with Cholelithiasis  Estimated Blood Loss: less than 100 mL         Drains: none         Specimens: Gallbladder           Complications: None; patient tolerated the procedure well.         Disposition: PACU - hemodynamically stable.         Condition: stable  Wilmon ArmsMatthew K. Corliss Skainssuei, MD, [facility]  General/ Trauma Surgery  08/14/2017 1:32 PM

## 2017-08-14 NOTE — Anesthesia Procedure Notes (Signed)
Procedure Name: Intubation Date/Time: 08/14/2017 12:28 PM Performed by: Moshe Salisbury, CRNA Pre-anesthesia Checklist: Patient identified, Emergency Drugs available, Suction available and Patient being monitored Patient Re-evaluated:Patient Re-evaluated prior to induction Oxygen Delivery Method: Circle System Utilized Preoxygenation: Pre-oxygenation with 100% oxygen Induction Type: IV induction Ventilation: Mask ventilation without difficulty Laryngoscope Size: Mac and 4 Grade View: Grade II Tube type: Oral Tube size: 8.0 mm Number of attempts: 1 Airway Equipment and Method: Stylet Placement Confirmation: ETT inserted through vocal cords under direct vision,  positive ETCO2 and breath sounds checked- equal and bilateral Secured at: 23 cm Tube secured with: Tape Dental Injury: Teeth and Oropharynx as per pre-operative assessment

## 2017-08-15 ENCOUNTER — Encounter (HOSPITAL_COMMUNITY): Payer: Self-pay | Admitting: Surgery

## 2017-08-15 DIAGNOSIS — K8010 Calculus of gallbladder with chronic cholecystitis without obstruction: Secondary | ICD-10-CM | POA: Diagnosis not present

## 2017-08-15 LAB — COMPREHENSIVE METABOLIC PANEL
ALT: 41 U/L (ref 17–63)
AST: 44 U/L — AB (ref 15–41)
Albumin: 3.6 g/dL (ref 3.5–5.0)
Alkaline Phosphatase: 53 U/L (ref 38–126)
Anion gap: 10 (ref 5–15)
BUN: 10 mg/dL (ref 6–20)
CHLORIDE: 106 mmol/L (ref 101–111)
CO2: 22 mmol/L (ref 22–32)
Calcium: 9.3 mg/dL (ref 8.9–10.3)
Creatinine, Ser: 1.11 mg/dL (ref 0.61–1.24)
Glucose, Bld: 168 mg/dL — ABNORMAL HIGH (ref 65–99)
POTASSIUM: 4.6 mmol/L (ref 3.5–5.1)
Sodium: 138 mmol/L (ref 135–145)
Total Bilirubin: 1.4 mg/dL — ABNORMAL HIGH (ref 0.3–1.2)
Total Protein: 6 g/dL — ABNORMAL LOW (ref 6.5–8.1)

## 2017-08-15 LAB — CBC
HEMATOCRIT: 41.6 % (ref 39.0–52.0)
HEMOGLOBIN: 14.5 g/dL (ref 13.0–17.0)
MCH: 32.4 pg (ref 26.0–34.0)
MCHC: 34.9 g/dL (ref 30.0–36.0)
MCV: 93.1 fL (ref 78.0–100.0)
Platelets: 181 10*3/uL (ref 150–400)
RBC: 4.47 MIL/uL (ref 4.22–5.81)
RDW: 12.1 % (ref 11.5–15.5)
WBC: 8.5 10*3/uL (ref 4.0–10.5)

## 2017-08-15 NOTE — Discharge Summary (Signed)
Physician Discharge Summary  Patient ID: Joshua Simpson MRN: [id number] DOB/AGE: 46/12/1970 46 y.o.  Admit date: 08/14/2017 Discharge date: 08/15/2017  Admission Diagnoses:Chronic calculus cholecystitis Discharge Diagnoses: same Active Problems:   Chronic cholecystitis with calculus   Discharged Condition: good  Hospital Course: Lap chole with IOC on 08/14/17.  Stayed overnight for pain control.  Feeling much better today.  No nausea or vomiting  Consults: None   Discharge Exam: Blood pressure 114/73, pulse 72, temperature (!) 97.4 F (36.3 C), temperature source Oral, resp. rate 18, height 6' (1.829 m), weight 125.4 kg (276 lb 7.3 oz), SpO2 95 %. General appearance: alert, cooperative and no distress GI: soft, incisional tenderness incisions c/d/i  Disposition: 01-Home or Self Care  Discharge Instructions    Call MD for:  persistant nausea and vomiting   Complete by:  As directed    Call MD for:  persistant nausea and vomiting   Complete by:  As directed    Call MD for:  redness, tenderness, or signs of infection (pain, swelling, redness, odor or green/yellow discharge around incision site)   Complete by:  As directed    Call MD for:  redness, tenderness, or signs of infection (pain, swelling, redness, odor or green/yellow discharge around incision site)   Complete by:  As directed    Call MD for:  severe uncontrolled pain   Complete by:  As directed    Call MD for:  severe uncontrolled pain   Complete by:  As directed    Call MD for:  temperature >100.4   Complete by:  As directed    Call MD for:  temperature >100.4   Complete by:  As directed    Diet general   Complete by:  As directed    Diet general   Complete by:  As directed    Driving Restrictions   Complete by:  As directed    Do not drive while taking pain medications   Driving Restrictions   Complete by:  As directed    Do not drive while taking pain medications   Increase activity slowly   Complete by:   As directed    Increase activity slowly   Complete by:  As directed    May shower / Bathe   Complete by:  As directed    May shower / Bathe   Complete by:  As directed      Allergies as of 08/15/2017      Reactions   Trolamine Salicylate Rash      Medication List    STOP taking these medications   atorvastatin 20 MG tablet Commonly known as:  LIPITOR     TAKE these medications   ALPRAZolam 0.5 MG tablet Commonly known as:  XANAX Take 0.5 mg by mouth 2 (two) times daily.   atenolol 25 MG tablet Commonly known as:  TENORMIN Take 1 tablet (25 mg total) by mouth 2 (two) times daily. What changed:    how much to take  additional instructions   cetirizine 10 MG tablet Commonly known as:  ZYRTEC Take 10 mg by mouth daily.   lisinopril 10 MG tablet Commonly known as:  PRINIVIL,ZESTRIL Take 5 mg by mouth daily. Takes  one-half tablet   ondansetron 4 MG tablet Commonly known as:  ZOFRAN Take 1 tablet (4 mg total) by mouth every 8 (eight) hours as needed for nausea or vomiting.   oxyCODONE 5 MG immediate release tablet Commonly known as:  Oxy IR/ROXICODONE Take  1 tablet (5 mg total) by mouth every 6 (six) hours as needed for severe pain.   pantoprazole 40 MG tablet Commonly known as:  PROTONIX Take 40 mg by mouth daily as needed (GERD).      Follow-up Information    Manus Rudd, MD In 3 weeks.   Specialty:  General Surgery Contact information: [address] [place] [address] [phone]           Signed: Wynona Luna 08/15/2017, 10:33 AM

## 2017-08-30 ENCOUNTER — Telehealth: Payer: Self-pay | Admitting: Cardiovascular Disease

## 2017-08-30 NOTE — Telephone Encounter (Signed)
Spoke with patient and over the last couple of weeks he has noticed increased irregular heartbeat. Last night unable to sleep secondary to them. He did take his Atenolol 1/2 tablet before bed. He woke up this am with them feeling like skipping every 3rd beat. He took his regular medications including his Atenolol 25 mg full tablet and Xanax 0.5 mg that he takes 1/2 twice a day. Blood pressure was 128/93 HR 69 Irreg. He had follow up appointment with his surgeon this am blood pressure 130/78 HR 83 reg. He was feeling better at that point. Patient has appointment Monday with DNP Harriet PhoK Lawrence. Will forward to Dr Tresa EndoKelly for review

## 2017-08-30 NOTE — Telephone Encounter (Signed)
This encounter was created in error - please disregard.

## 2017-08-30 NOTE — Telephone Encounter (Signed)
Discussed with Harrell LarkH Meng PA and ok to increase his Atenolol to 25 mg twice a day. Called and advised patient however he is concerned that it may slow things down too much. Advised patient ok to take Atenolol 25 mg 1 in the morning and 1/2 to 1 tablet in the evening based on how his heart rate was doing. Keep appointment Monday as scheduled

## 2017-08-30 NOTE — Addendum Note (Signed)
Addended by: Regis BillPRATT, Kiwana Deblasi B on: 08/30/2017 08:59 AM   Modules accepted: Level of Service, SmartSet

## 2017-08-30 NOTE — Telephone Encounter (Signed)
Duplicate encounter

## 2017-08-30 NOTE — Telephone Encounter (Signed)
Please call,pt says he have been having irregular heart beats and high bloos pressure,Pt has an appointment for Monday with PA.Marland Kitchen.Please call to evaluate.

## 2017-08-30 NOTE — Telephone Encounter (Signed)
F/U call: ° °Patient returning call  °

## 2017-08-30 NOTE — Telephone Encounter (Signed)
Pt have been having, irregular heart rate and blood pressure is high.Pt has an appointment on Monday with the PA.Please call to evaluate.

## 2017-08-31 NOTE — Progress Notes (Signed)
Cardiology Office Note   Date:  09/02/2017   ID:  Joshua Simpson, DOB Feb 01, 1971, MRN [id number]  PCP:  Delphia Grates, FNP  Cardiologist:  Dr. Tresa Endo Chief Complaint  Patient presents with  . Follow-up    HBP.  . Irregular Heart Beat  . Chest Pain    Patient doesn't know if it is gas or what.     History of Present Illness: Joshua Simpson is a 47 y.o. male who presents for ongoing assessment and management of atypical chest pain, hypertension, anxiety, and significant deconditioning.  He also is awakening at night with his heart pounding, and admitted to very poor sleep.  Often noting daytime sleepiness.  Other history includes GERD for which she takes pantoprazole.  He was last seen by Dr. Tresa Endo on 12/12/2016, after having rotator cuff surgery.  He developed intermittent episodes of chest pain which were found to be atypical for cardiac source of pain.  On that office visit Dr. Tresa Endo ordered a treadmill stress test, and also order fasting lipids and LFTs.  And he was to follow-up in 6 months.  Study Highlights     Blood pressure demonstrated a hypertensive response to exercise.  There was no ST segment deviation noted during stress.   Normal ETT with no evidence of ischemia Diastolic HTN with stress    Total cholesterol 220, HDL 52, LDL 141, triglycerides 135.  He called our office on 08/30/2017 with complaints of irregular heartbeats and high blood pressure.  He states he has been having several episodes of racing heart rate, frequent thumping and palpitations which she usually recognizes more at night when he is trying to go to sleep. He reports 2 occasions when he was awakened tonight racing heart rate. The patient does not drink any more caffeine , he does eat chocolate. He denies any soft drain, energy drinks, and iced tea.  This has become more worrisome to him over the last 2 days. He has not increased his dose of atenolol as directed as he was afraid to take too much  medication his blood pressures been on the low side.  Of note, the patient has had cholecystectomy on 08/15/2017. He is recovering well without complaints of diarrhea or abdominal pain.  Past Medical History:  Diagnosis Date  . Anxiety   . Hypertension     Past Surgical History:  Procedure Laterality Date  . CHOLECYSTECTOMY N/A 08/14/2017   Procedure: LAPAROSCOPIC CHOLECYSTECTOMY WITH INTRAOPERATIVE CHOLANGIOGRAM;  Surgeon: Manus Rudd, MD;  Location: [facility] OR;  Service: General;  Laterality: N/A;  . SHOULDER SURGERY       Current Outpatient Medications  Medication Sig Dispense Refill  . ALPRAZolam (XANAX) 0.5 MG tablet Take 0.5 mg by mouth 2 (two) times daily.    Marland Kitchen atenolol (TENORMIN) 25 MG tablet Take 1 tablet (25 mg total) by mouth 2 (two) times daily. (Patient taking differently: Take 12.5-25 mg by mouth 2 (two) times daily. Takes 25 mg in the morning and half tablet (12.5 mg) in the evening) 60 tablet 6  . cetirizine (ZYRTEC) 10 MG tablet Take 10 mg by mouth daily.    Marland Kitchen lisinopril (PRINIVIL,ZESTRIL) 10 MG tablet Take 5 mg by mouth daily. Takes  one-half tablet    . ondansetron (ZOFRAN) 4 MG tablet Take 1 tablet (4 mg total) by mouth every 8 (eight) hours as needed for nausea or vomiting. 11 tablet 0  . pantoprazole (PROTONIX) 40 MG tablet Take 40 mg by mouth daily as needed (  GERD).      No current facility-administered medications for this visit.     Allergies:   Trolamine salicylate    Social History:  The patient  reports that  has never smoked. he has never used smokeless tobacco. He reports that he does not drink alcohol or use drugs.   Family History:  The patient's family history includes Lung cancer in his father.    ROS: All other systems are reviewed and negative. Unless otherwise mentioned in H&P    PHYSICAL EXAM: VS:  BP 116/90   Pulse 70   Ht 6' (1.829 m)   Wt 271 lb (122.9 kg)   BMI 36.75 kg/m  , BMI Body mass index is 36.75 kg/m. GEN: Well nourished,  well developed, in no acute distress  HEENT: normal  Neck: no JVD, carotid bruits, or masses Cardiac: RRR; no murmurs, rubs, or gallops,no edema  Respiratory:  clear to auscultation bilaterally, normal work of breathing GI: soft, nontender, nondistended, + BS obese MS: no deformity or atrophy  Skin: warm and dry, no rash Neuro:  Strength and sensation are intact Psych: euthymic mood, full affect   EKG:  EKG normal sinus rhythm, heart of 70 bpm, no evidence of either PACs. He does have nonspecific T-wave abnormality before through V6. Recent Labs: 12/25/2016: TSH 2.010 08/15/2017: ALT 41; BUN 10; Creatinine, Ser 1.11; Hemoglobin 14.5; Platelets 181; Potassium 4.6; Sodium 138    Lipid Panel    Component Value Date/Time   CHOL 220 (H) 12/25/2016 1054   TRIG 135 12/25/2016 1054   HDL 52 12/25/2016 1054   CHOLHDL 4.2 12/25/2016 1054   LDLCALC 141 (H) 12/25/2016 1054      Wt Readings from Last 3 Encounters:  09/02/17 271 lb (122.9 kg)  08/14/17 276 lb 7.3 oz (125.4 kg)  12/12/16 269 lb 3.2 oz (122.1 kg)      Other studies Reviewed:   ASSESSMENT AND PLAN:  1. Frequent palpitations: The patient is already taking atenolol 25 mg during the day and 12.5 mg at night. He endorses frequent palpitations especially in the evening when trying to sleep sometimes awakening him at night. I reviewed his recent labs completed on 08/15/2017, potassium was normal he was not anemic.   I'm going to put a two-week cardiac monitor on the patient to evaluate frequency and morphology of his palpitation to evaluate further before making adjustments in his treatment regimen. He is currently on atenolol. He asked that he may take a Xanax with nighttime palpitations or worry him and I told him that he would be okay. Consider additional beta blocker or change in beta blocker therapy if he is having neck.  2. Chronic diastolic dysfunction: Most recent echocardiogram revealed normal LV systolic function with  grade 2 diastolic dysfunction. He does not appear to have evidence of decompensation on exam. Blood pressure is low normal.  3. Hypercholesterolemia: The patient not currently on statin therapy. Do not find him allergic to it. Address this on next visit this visit was more focused on his symptoms of palpitations.    Current medicines are reviewed at length with the patient today.    Labs/ tests ordered today include: Two-week cardiac monitor for frequent palpitations, sleep study due to insomnia shortness of breath and snoring   Bettey MareKathryn M. Liborio NixonLawrence DNP, ANP, AACC   09/02/2017 2:07 PM    [facility] [address], [place], [place] [address] Phone: [phone]; Fax: [phone]

## 2017-09-02 ENCOUNTER — Ambulatory Visit (INDEPENDENT_AMBULATORY_CARE_PROVIDER_SITE_OTHER): Payer: BLUE CROSS/BLUE SHIELD | Admitting: Adult Health

## 2017-09-02 ENCOUNTER — Encounter: Payer: Self-pay | Admitting: Adult Health

## 2017-09-02 VITALS — BP 116/90 | HR 70 | Ht 72.0 in | Wt 271.0 lb

## 2017-09-02 DIAGNOSIS — R002 Palpitations: Secondary | ICD-10-CM | POA: Diagnosis not present

## 2017-09-02 DIAGNOSIS — R0602 Shortness of breath: Secondary | ICD-10-CM | POA: Diagnosis not present

## 2017-09-02 DIAGNOSIS — R0683 Snoring: Secondary | ICD-10-CM

## 2017-09-02 DIAGNOSIS — R000 Tachycardia, unspecified: Secondary | ICD-10-CM | POA: Diagnosis not present

## 2017-09-02 NOTE — Telephone Encounter (Signed)
agree

## 2017-09-02 NOTE — Patient Instructions (Signed)
Medication Instructions:  No changes If you need a refill on your cardiac medications before your next appointment, please call your pharmacy.  Testing/Procedures: Your physician has recommended that you have a sleep study. This test records several body functions during sleep, including: brain activity, eye movement, oxygen and carbon dioxide blood levels, heart rate and rhythm, breathing rate and rhythm, the flow of air through your mouth and nose, snoring, body muscle movements, and chest and belly movement.  Your physician has recommended that you wear a 2 WEEK event monitor. Event monitors are medical devices that record the heart's electrical activity. Doctors most often us these monitors to diagnose arrhythmias. Arrhythmias are problems with the speed or rhythm of the heartbeat. The monitor is a small, portable device. You can wear one while you do your normal daily activities. This is usually used to diagnose what is causing palpitations/syncope (passing out).   Follow-Up: Your physician wants you to follow-up in: 1 MONTH WITH DR Tresa EndoKELLY -OR- Joni ReiningKATHRYN LAWRENCE, DNP.    Thank you for choosing [facility]!!

## 2017-09-04 ENCOUNTER — Encounter: Payer: Self-pay | Admitting: *Deleted

## 2017-09-11 ENCOUNTER — Ambulatory Visit (INDEPENDENT_AMBULATORY_CARE_PROVIDER_SITE_OTHER): Payer: BLUE CROSS/BLUE SHIELD

## 2017-09-11 DIAGNOSIS — R002 Palpitations: Secondary | ICD-10-CM | POA: Diagnosis not present

## 2017-09-11 DIAGNOSIS — R000 Tachycardia, unspecified: Secondary | ICD-10-CM

## 2017-09-12 ENCOUNTER — Other Ambulatory Visit: Payer: Self-pay | Admitting: Cardiovascular Disease

## 2017-09-12 DIAGNOSIS — R002 Palpitations: Secondary | ICD-10-CM

## 2017-09-12 DIAGNOSIS — E669 Obesity, unspecified: Secondary | ICD-10-CM

## 2017-09-12 DIAGNOSIS — R0683 Snoring: Secondary | ICD-10-CM

## 2017-09-12 DIAGNOSIS — R0602 Shortness of breath: Secondary | ICD-10-CM

## 2017-09-13 ENCOUNTER — Telehealth: Payer: Self-pay | Admitting: *Deleted

## 2017-09-16 NOTE — Telephone Encounter (Signed)
Left message to return a call to discuss details of HST. Insurance denied the in-lab study.

## 2017-09-27 ENCOUNTER — Encounter (HOSPITAL_BASED_OUTPATIENT_CLINIC_OR_DEPARTMENT_OTHER): Payer: BLUE CROSS/BLUE SHIELD

## 2017-09-30 NOTE — Progress Notes (Signed)
Cardiology Office Note   Date:  10/01/2017   ID:  Joshua Simpson, DOB 05-Dec-1970, MRN [id number]  PCP:  Mikael Spray, NP  Cardiologist:  Dr. Tresa Endo Chief Complaint  Patient presents with  . Follow-up  . Palpitations     History of Present Illness: Joshua Simpson is a 46 y.o. male who presents for ongoing assessment and management atypical chest pain, with a history of hypertension, anxiety, and deconditioning, with episodes of awakening at night with heart pounding and inability to sleep well. Other history also includes GERD.  A two-week cardiac monitor was placed to evaluate frequency and morphology of rapid heart rhythm before making adjustments to treatment regimen. He was given permission to take nighttime Xanax which would help him sleep better. Will need fasting lipids and LFTs at this time as he is not on statin therapy.  According to report under " summary" a baseline transmission revealed sinus bradycardia, sinus rhythm, PACs with average heart rate seen and 80 bpm, low average heart rate seen and 60 bpm. 3 daily trigger recording with symptoms including rapid heart rate, palpitations, and flutter, there were no positive noted for 3 seconds are larger.  Findings included sinus bradycardia sinus rhythm with PACs, atrial couplets, and first-degree AV block. At the time of this dictation is not been officially read by a cardiologist.  He comes today with multiple questions about his test results. I've given him significant amount reassurance. He is due to follow-up with an outpatient sleep study and is due to combined equipment to do so.   Past Medical History:  Diagnosis Date  . Anxiety   . Hypertension     Past Surgical History:  Procedure Laterality Date  . CHOLECYSTECTOMY N/A 08/14/2017   Procedure: LAPAROSCOPIC CHOLECYSTECTOMY WITH INTRAOPERATIVE CHOLANGIOGRAM;  Surgeon: Manus Rudd, MD;  Location: [facility] OR;  Service: General;  Laterality: N/A;  . SHOULDER SURGERY        Current Outpatient Medications  Medication Sig Dispense Refill  . ALPRAZolam (XANAX) 0.5 MG tablet Take 0.5 mg by mouth 2 (two) times daily.    Marland Kitchen atenolol (TENORMIN) 25 MG tablet Take 25 mg by mouth daily. Take one tablet in the morning and take one half tablet in the evening    . cetirizine (ZYRTEC) 10 MG tablet Take 10 mg by mouth daily.    Marland Kitchen lisinopril (PRINIVIL,ZESTRIL) 10 MG tablet Take 5 mg by mouth daily. Takes one-half tablet    . ondansetron (ZOFRAN) 4 MG tablet Take 1 tablet (4 mg total) by mouth every 8 (eight) hours as needed for nausea or vomiting. 11 tablet 0  . pantoprazole (PROTONIX) 40 MG tablet Take 40 mg by mouth daily as needed (GERD).      No current facility-administered medications for this visit.     Allergies:   Trolamine salicylate    Social History:  The patient  reports that  has never smoked. he has never used smokeless tobacco. He reports that he does not drink alcohol or use drugs.   Family History:  The patient's family history includes Lung cancer in his father.    ROS: All other systems are reviewed and negative. Unless otherwise mentioned in H&P    PHYSICAL EXAM: VS:  BP 116/82   Pulse 77   Ht 6' (1.829 m)   Wt 275 lb (124.7 kg)   BMI 37.30 kg/m  , BMI Body mass index is 37.3 kg/m. GEN: Well nourished, well developed, in no acute distress  Neck: no  JVD, carotid bruits, or masses Cardiac: RRR; no murmurs, rubs, or gallops,no edema  Respiratory:  clear to auscultation bilaterally, normal work of breathing  Recent Labs: 12/25/2016: TSH 2.010 08/15/2017: ALT 41; BUN 10; Creatinine, Ser 1.11; Hemoglobin 14.5; Platelets 181; Potassium 4.6; Sodium 138    Lipid Panel    Component Value Date/Time   CHOL 220 (H) 12/25/2016 1054   TRIG 135 12/25/2016 1054   HDL 52 12/25/2016 1054   CHOLHDL 4.2 12/25/2016 1054   LDLCALC 141 (H) 12/25/2016 1054      Wt Readings from Last 3 Encounters:  10/01/17 275 lb (124.7 kg)  09/02/17 271 lb (122.9  kg)  08/14/17 276 lb 7.3 oz (125.4 kg)     ASSESSMENT AND PLAN:  1. Palpitations: Reviewed cardiac monitor report. Having PACs. I've given him reassurance. He is to continue his atenolol. Oh further cardiac testing at this time.  2. Anxiety over health: He is on Xanax when necessary. Reassurance given  3. OSA evaluation: Home sleep study is planned.  Current medicines are reviewed at length with the patient today.    Labs/ tests ordered today include: none  Bettey MareKathryn M. Liborio NixonLawrence DNP, ANP, AACC   10/01/2017 2:07 PM    [facility] [address], [place], [place] [address] Phone: [phone]; Fax: [phone]

## 2017-10-01 ENCOUNTER — Ambulatory Visit (INDEPENDENT_AMBULATORY_CARE_PROVIDER_SITE_OTHER): Payer: BLUE CROSS/BLUE SHIELD | Admitting: Adult Health

## 2017-10-01 ENCOUNTER — Telehealth: Payer: Self-pay | Admitting: Adult Health

## 2017-10-01 ENCOUNTER — Encounter: Payer: Self-pay | Admitting: Adult Health

## 2017-10-01 VITALS — BP 116/82 | HR 77 | Ht 72.0 in | Wt 275.0 lb

## 2017-10-01 DIAGNOSIS — R002 Palpitations: Secondary | ICD-10-CM | POA: Diagnosis not present

## 2017-10-01 DIAGNOSIS — R0683 Snoring: Secondary | ICD-10-CM | POA: Diagnosis not present

## 2017-10-01 DIAGNOSIS — F411 Generalized anxiety disorder: Secondary | ICD-10-CM

## 2017-10-01 MED ORDER — LISINOPRIL 10 MG PO TABS: 5.0000 mg | ORAL_TABLET | Freq: Every day | ORAL | 6 refills | Status: DC

## 2017-10-01 MED ORDER — ATENOLOL 25 MG PO TABS: ORAL_TABLET | ORAL | 6 refills | Status: DC

## 2017-10-01 MED ORDER — ATENOLOL 25 MG PO TABS: 25.0000 mg | ORAL_TABLET | Freq: Every day | ORAL | 6 refills | Status: DC

## 2017-10-01 MED ORDER — PANTOPRAZOLE SODIUM 40 MG PO TBEC: 40.0000 mg | DELAYED_RELEASE_TABLET | Freq: Every day | ORAL | 6 refills | Status: DC | PRN

## 2017-10-01 NOTE — Telephone Encounter (Signed)
[facility].Advised patient takes atenolol 25 mg in am and 1/2 tablet 12.5 mg in pm.

## 2017-10-01 NOTE — Patient Instructions (Signed)
Medication Instructions:  NO CHANGES- Your physician recommends that you continue on your current medications as directed. Please refer to the Current Medication list given to you today.  If you need a refill on your cardiac medications before your next appointment, please call your pharmacy.  Special Instructions: Make sure to continue with sleep study as planned  Follow-Up: Your physician wants you to follow-up in: 6 MONTHS WITH DR Tresa EndoKELLY You should receive a reminder letter in the mail two months in advance. If you do not receive a letter, please call our office 01-2018 to schedule the 03-2018 follow-up appointment.   Thank you for choosing [facility]!!

## 2017-10-01 NOTE — Addendum Note (Signed)
Addended by: Neoma LamingPUGH, CHERYL J on: 10/01/2017 02:46 PM   Modules accepted: Orders

## 2017-10-01 NOTE — Telephone Encounter (Signed)
New Message   Pt c/o medication issue:  1. Name of Medication: atenolol (TENORMIN) 25 MG tablet  2. How are you currently taking this medication (dosage and times per day)?   3. Are you having a reaction (difficulty breathing--STAT)?   4. What is your medication issue? Vinetta BergamoCharley is calling from pharmacy to get clarification on how the patient should be taking the medication

## 2017-10-21 ENCOUNTER — Other Ambulatory Visit: Payer: Self-pay

## 2017-10-21 ENCOUNTER — Emergency Department (HOSPITAL_BASED_OUTPATIENT_CLINIC_OR_DEPARTMENT_OTHER)
Admission: EM | Admit: 2017-10-21 | Discharge: 2017-10-22 | Disposition: A | Discharge status: Home / Self Care | Payer: BLUE CROSS/BLUE SHIELD | Attending: Emergency Medicine | Admitting: Emergency Medicine

## 2017-10-21 ENCOUNTER — Emergency Department (HOSPITAL_BASED_OUTPATIENT_CLINIC_OR_DEPARTMENT_OTHER): Payer: BLUE CROSS/BLUE SHIELD

## 2017-10-21 ENCOUNTER — Encounter (HOSPITAL_BASED_OUTPATIENT_CLINIC_OR_DEPARTMENT_OTHER): Payer: Self-pay | Admitting: *Deleted

## 2017-10-21 DIAGNOSIS — R51 Headache: Secondary | ICD-10-CM | POA: Insufficient documentation

## 2017-10-21 DIAGNOSIS — N410 Acute prostatitis: Secondary | ICD-10-CM | POA: Diagnosis not present

## 2017-10-21 DIAGNOSIS — I10 Essential (primary) hypertension: Secondary | ICD-10-CM | POA: Diagnosis not present

## 2017-10-21 DIAGNOSIS — R509 Fever, unspecified: Secondary | ICD-10-CM | POA: Diagnosis present

## 2017-10-21 DIAGNOSIS — R0981 Nasal congestion: Secondary | ICD-10-CM | POA: Insufficient documentation

## 2017-10-21 DIAGNOSIS — Z79899 Other long term (current) drug therapy: Secondary | ICD-10-CM | POA: Insufficient documentation

## 2017-10-21 LAB — COMPREHENSIVE METABOLIC PANEL
ALT: 31 U/L (ref 17–63)
ANION GAP: 10 (ref 5–15)
AST: 25 U/L (ref 15–41)
Albumin: 4.5 g/dL (ref 3.5–5.0)
Alkaline Phosphatase: 85 U/L (ref 38–126)
BUN: 10 mg/dL (ref 6–20)
CALCIUM: 9.1 mg/dL (ref 8.9–10.3)
CHLORIDE: 104 mmol/L (ref 101–111)
CO2: 23 mmol/L (ref 22–32)
CREATININE: 1.23 mg/dL (ref 0.61–1.24)
Glucose, Bld: 160 mg/dL — ABNORMAL HIGH (ref 65–99)
Potassium: 3.5 mmol/L (ref 3.5–5.1)
SODIUM: 137 mmol/L (ref 135–145)
Total Bilirubin: 1.8 mg/dL — ABNORMAL HIGH (ref 0.3–1.2)
Total Protein: 7.3 g/dL (ref 6.5–8.1)

## 2017-10-21 LAB — CBC WITH DIFFERENTIAL/PLATELET
BASOS PCT: 0 %
Basophils Absolute: 0 10*3/uL (ref 0.0–0.1)
EOS ABS: 0.1 10*3/uL (ref 0.0–0.7)
Eosinophils Relative: 1 %
HEMATOCRIT: 45.5 % (ref 39.0–52.0)
HEMOGLOBIN: 15.5 g/dL (ref 13.0–17.0)
LYMPHS ABS: 0.9 10*3/uL (ref 0.7–4.0)
Lymphocytes Relative: 6 %
MCH: 31.7 pg (ref 26.0–34.0)
MCHC: 34.1 g/dL (ref 30.0–36.0)
MCV: 93 fL (ref 78.0–100.0)
MONOS PCT: 6 %
Monocytes Absolute: 0.9 10*3/uL (ref 0.1–1.0)
NEUTROS ABS: 13.4 10*3/uL — AB (ref 1.7–7.7)
NEUTROS PCT: 87 %
Platelets: 192 10*3/uL (ref 150–400)
RBC: 4.89 MIL/uL (ref 4.22–5.81)
RDW: 12.4 % (ref 11.5–15.5)
WBC: 15.4 10*3/uL — AB (ref 4.0–10.5)

## 2017-10-21 LAB — I-STAT CG4 LACTIC ACID, ED: LACTIC ACID, VENOUS: 1.1 mmol/L (ref 0.5–1.9)

## 2017-10-21 MED ORDER — ACETAMINOPHEN 325 MG PO TABS
650.0000 mg | ORAL_TABLET | Freq: Once | ORAL | Status: AC
  Administered 2017-10-21: 650 mg via ORAL

## 2017-10-21 MED ORDER — ACETAMINOPHEN 325 MG PO TABS
ORAL_TABLET | ORAL | Status: AC
  Filled 2017-10-21: qty 2

## 2017-10-21 NOTE — ED Triage Notes (Signed)
Fever and chills. He was seen by his MD a week ago and started on antibiotics for a sinus infection. He took Ibuprofen at 8pm.

## 2017-10-22 LAB — URINALYSIS, ROUTINE W REFLEX MICROSCOPIC
Bilirubin Urine: NEGATIVE
Glucose, UA: NEGATIVE mg/dL
KETONES UR: NEGATIVE mg/dL
Nitrite: NEGATIVE
PH: 6.5 (ref 5.0–8.0)
Protein, ur: NEGATIVE mg/dL
SPECIFIC GRAVITY, URINE: 1.01 (ref 1.005–1.030)

## 2017-10-22 LAB — URINALYSIS, MICROSCOPIC (REFLEX)

## 2017-10-22 LAB — I-STAT CG4 LACTIC ACID, ED: LACTIC ACID, VENOUS: 0.69 mmol/L (ref 0.5–1.9)

## 2017-10-22 MED ORDER — SODIUM CHLORIDE 0.9 % IV BOLUS
1000.0000 mL | Freq: Once | INTRAVENOUS | Status: AC
  Administered 2017-10-22: 1000 mL via INTRAVENOUS

## 2017-10-22 MED ORDER — CEPHALEXIN 500 MG PO CAPS: 500.0000 mg | ORAL_CAPSULE | Freq: Three times a day (TID) | ORAL | 0 refills | Status: DC

## 2017-10-22 MED ORDER — CEFDINIR 300 MG PO CAPS: 300.0000 mg | ORAL_CAPSULE | Freq: Two times a day (BID) | ORAL | 0 refills | Status: DC

## 2017-10-22 MED ORDER — SODIUM CHLORIDE 0.9 % IV SOLN
2.0000 g | Freq: Once | INTRAVENOUS | Status: AC
  Administered 2017-10-22: 2 g via INTRAVENOUS
  Filled 2017-10-22: qty 20

## 2017-10-22 NOTE — ED Notes (Signed)
Pt ambulatory to bathroom without difficulty. Pt reports feeling better. MD aware.

## 2017-10-22 NOTE — ED Provider Notes (Addendum)
[facility] EMERGENCY DEPARTMENT Provider Note   CSN: [id number] Arrival date & time: 10/21/17  2253     History   Chief Complaint Chief Complaint  Patient presents with  . Fever    HPI Joshua Simpson is a 47 y.o. male.  Patient presents to the emergency department for evaluation of fever, chills, sweats, generalized weakness.  Patient reports that symptoms began several hours before coming to the ER.  He did take ibuprofen without complete resolution of his symptoms.  He is currently on clindamycin.  This was started 1 week ago by his ENT specialist for sinus infection.  He continues to have congestion and mild headaches.  No neck pain or stiffness.  Patient has not had cough, chest congestion.  He denies nausea, vomiting, diarrhea.     Past Medical History:  Diagnosis Date  . Anxiety   . Hypertension     Patient Active Problem List   Diagnosis Date Noted  . Chronic cholecystitis with calculus 08/14/2017  . Palpitations 08/15/2015  . Obesity (BMI 30-39.9) 08/15/2015  . ABDOMINAL PAIN, LEFT UPPER QUADRANT 04/25/2010  . Anxiety state 04/18/2010  . Essential hypertension 04/18/2010  . PAIN IN JOINT, ANKLE AND FOOT 04/18/2010  . EPIGASTRIC PAIN 04/18/2010    Past Surgical History:  Procedure Laterality Date  . CHOLECYSTECTOMY N/A 08/14/2017   Procedure: LAPAROSCOPIC CHOLECYSTECTOMY WITH INTRAOPERATIVE CHOLANGIOGRAM;  Surgeon: Manus Rudd, MD;  Location: [facility] OR;  Service: General;  Laterality: N/A;  . SHOULDER SURGERY          Home Medications    Prior to Admission medications   Medication Sig Start Date End Date Taking? Authorizing Provider  ALPRAZolam Prudy Feeler) 0.5 MG tablet Take 0.5 mg by mouth 2 (two) times daily.   Yes [provider]  atenolol (TENORMIN) 25 MG tablet Take 25 mg in am and 1/2 tablet ( 12.5 mg ) in pm 10/01/17  Yes Jodelle Gross, NP  cetirizine (ZYRTEC) 10 MG tablet Take 10 mg by mouth daily.   Yes [provider]  CLINDAMYCIN HCL PO Take by mouth.   Yes [provider]  lisinopril (PRINIVIL,ZESTRIL) 10 MG tablet Take 0.5 tablets (5 mg total) by mouth daily. Takes one-half tablet 10/01/17  Yes Jodelle Gross, NP  ondansetron (ZOFRAN) 4 MG tablet Take 1 tablet (4 mg total) by mouth every 8 (eight) hours as needed for nausea or vomiting. 08/03/16  Yes Mackuen, Courteney Lyn, MD  pantoprazole (PROTONIX) 40 MG tablet Take 1 tablet (40 mg total) by mouth daily as needed (GERD). 10/01/17  Yes Jodelle Gross, NP  cephALEXin (KEFLEX) 500 MG capsule Take 1 capsule (500 mg total) by mouth 3 (three) times daily. 10/22/17   Gilda Crease, MD    Family History Family History  Problem Relation Age of Onset  . Lung cancer Father     Social History Social History   Tobacco Use  . Smoking status: Never Smoker  . Smokeless tobacco: Never Used  Substance Use Topics  . Alcohol use: No    Alcohol/week: 0.0 oz    Comment: occassionally  . Drug use: No     Allergies   Trolamine salicylate   Review of Systems Review of Systems  Constitutional: Positive for chills, diaphoresis, fatigue and fever.  All other systems reviewed and are negative.    Physical Exam Updated Vital Signs BP 103/79   Pulse 86   Temp 99.9 F (37.7 C) (Oral)   Resp 16  Ht 6' (1.829 m)   Wt 124.7 kg (275 lb)   SpO2 95%   BMI 37.30 kg/m   Physical Exam  Constitutional: He is oriented to person, place, and time. He appears well-developed and well-nourished. No distress.  HENT:  Head: Normocephalic and atraumatic.  Right Ear: Hearing normal.  Left Ear: Hearing normal.  Nose: Nose normal.  Mouth/Throat: Oropharynx is clear and moist and mucous membranes are normal.  Eyes: Pupils are equal, round, and reactive to light. Conjunctivae and EOM are normal.  Neck: Normal range of motion. Neck supple.  Cardiovascular: Regular rhythm, S1 normal and S2 normal. Exam reveals no gallop and no friction rub.    No murmur heard. Pulmonary/Chest: Effort normal and breath sounds normal. No respiratory distress. He exhibits no tenderness.  Abdominal: Soft. Normal appearance and bowel sounds are normal. There is no hepatosplenomegaly. There is no tenderness. There is no rebound, no guarding, no tenderness at McBurney's point and negative Murphy's sign. No hernia.  Genitourinary:  Genitourinary Comments: Prostate uniformly enlarged and tender no nodules  Musculoskeletal: Normal range of motion.  Neurological: He is alert and oriented to person, place, and time. He has normal strength. No cranial nerve deficit or sensory deficit. Coordination normal. GCS eye subscore is 4. GCS verbal subscore is 5. GCS motor subscore is 6.  Skin: Skin is warm and intact. No rash noted. He is diaphoretic. No cyanosis.  Psychiatric: He has a normal mood and affect. His speech is normal and behavior is normal. Thought content normal.  Nursing note and vitals reviewed.    ED Treatments / Results  Labs (all labs ordered are listed, but only abnormal results are displayed) Labs Reviewed  COMPREHENSIVE METABOLIC PANEL - Abnormal; Notable for the following components:      Result Value   Glucose, Bld 160 (*)    Total Bilirubin 1.8 (*)    All other components within normal limits  CBC WITH DIFFERENTIAL/PLATELET - Abnormal; Notable for the following components:   WBC 15.4 (*)    Neutro Abs 13.4 (*)    All other components within normal limits  URINALYSIS, ROUTINE W REFLEX MICROSCOPIC - Abnormal; Notable for the following components:   APPearance HAZY (*)    Hgb urine dipstick TRACE (*)    Leukocytes, UA SMALL (*)    All other components within normal limits  URINALYSIS, MICROSCOPIC (REFLEX) - Abnormal; Notable for the following components:   Bacteria, UA MANY (*)    Squamous Epithelial / LPF 0-5 (*)    All other components within normal limits  URINE CULTURE  CULTURE, BLOOD (ROUTINE X 2)  CULTURE, BLOOD (ROUTINE X 2)   I-STAT CG4 LACTIC ACID, ED  I-STAT CG4 LACTIC ACID, ED    EKG None  Radiology Dg Chest 2 View  Result Date: 10/21/2017 CLINICAL DATA:  Fever and chills EXAM: CHEST - 2 VIEW COMPARISON:  08/31/2016 FINDINGS: The heart size and mediastinal contours are within normal limits. Both lungs are clear. The visualized skeletal structures are unremarkable. IMPRESSION: No active cardiopulmonary disease. Electronically Signed   By: Tollie Ethavid  Kwon M.D.   On: 10/21/2017 23:37    Procedures Procedures (including critical care time)  Medications Ordered in ED Medications  acetaminophen (TYLENOL) tablet 650 mg (650 mg Oral Given 10/21/17 2311)  cefTRIAXone (ROCEPHIN) 2 g in sodium chloride 0.9 % 100 mL IVPB (0 g Intravenous Stopped 10/22/17 0146)  sodium chloride 0.9 % bolus 1,000 mL (0 mLs Intravenous Stopped 10/22/17 0211)  Initial Impression / Assessment and Plan / ED Course  I have reviewed the triage vital signs and the nursing notes.  Pertinent labs & imaging results that were available during my care of the patient were reviewed by me and considered in my medical decision making (see chart for details).     Patient presents with acute onset fever.  He is currently on clindamycin for sinus infection.  He does not have any other concomitant upper respiratory infection symptoms.  He was diaphoretic and weak at arrival.  He did have a fever of 100.6.  Initial lactic acid was normal.  He does have a leukocytosis.  Blood work was otherwise unremarkable.  He does not have any abdominal pain or tenderness, exam is benign.  Urinalysis did suggest infection.  A rectal exam was performed and he does have an enlarged and tender prostate.  Suspect that the source of his fever is urinary tract infection, specifically prostatitis.  Patient was noted to be mildly hypotensive at arrival.  He had taken his nighttime dose of atenolol just prior to coming to the ER.  He reports that his blood pressure runs in the 100s  normally.  He was given 2 L of fluid.  Recheck of his lactic acid is still normal, less than initial.  Fever has resolved and he is feeling much improvement.  He has been ambulatory here in the ER without any dizziness or symptoms.  Patient administered 2 g of Rocephin IV for his infection.  As he is experiencing significant improvement, I do not feel he requires hospitalization at this time.  We will continue 14-day course of Omnicef, follow-up with PCP this week for recheck.  Return if he has any worsening symptoms.  He was told to stop the clindamycin.  Final Clinical Impressions(s) / ED Diagnoses   Final diagnoses:  Acute prostatitis    ED Discharge Orders        Ordered    cephALEXin (KEFLEX) 500 MG capsule  3 times daily     10/22/17 0303       Gilda Crease, MD 10/22/17 0303    Gilda Crease, MD 10/22/17 [phone]

## 2017-10-22 NOTE — ED Notes (Signed)
Chaperoned digital rectal exam with EDP. Pt tolerated well.

## 2017-10-24 LAB — URINE CULTURE

## 2017-10-25 ENCOUNTER — Telehealth: Payer: Self-pay | Admitting: *Deleted

## 2017-10-25 NOTE — Telephone Encounter (Signed)
Post ED Visit - Positive Culture Follow-up  Culture report reviewed by antimicrobial stewardship pharmacist:  [x]  Enzo BiNathan Batchelder, Pharm.D. []  Celedonio MiyamotoJeremy Frens, Pharm.D., BCPS AQ-ID []  Garvin FilaMike Maccia, Pharm.D., BCPS []  Georgina PillionElizabeth Martin, Pharm.D., BCPS []  NewkirkMinh Pham, [address]Pharm.D., BCPS, AAHIVP []  Estella HuskMichelle Turner, Pharm.D., BCPS, AAHIVP []  Lysle Pearlachel Rumbarger, PharmD, BCPS []  Blake DivineShannon Parkey, PharmD []  Pollyann SamplesAndy Johnston, PharmD, BCPS  Positive urine culture Treated with Cefdinir, organism sensitive to the same and no further patient follow-up is required at this time.  Virl AxeRobertson, Milynn Quirion [facility] 10/25/2017, 9:56 AM

## 2017-10-27 LAB — CULTURE, BLOOD (ROUTINE X 2)
Culture: NO GROWTH
Culture: NO GROWTH
Special Requests: ADEQUATE
Special Requests: ADEQUATE

## 2017-12-11 ENCOUNTER — Telehealth: Payer: Self-pay | Admitting: Cardiovascular Disease

## 2017-12-11 NOTE — Telephone Encounter (Signed)
Returned call to patient, advised he is on the wait list and will add to Dr. Tresa Endo schedule if any appts become available or if Dr. Tresa Endo adds additional days to clinic.      He states ongoing concern with CP, palpitations and SOB.  States he is not having these symptoms currently.  Nothing makes the pain worse or better but the pain does radiate to his back (thinks it may be a muscle).   He is wondering if he should be seen sooner by someone else or go to the ER.  Offered appt Friday 5/31 with Corine Shelter PA.  Patient agreed and advised would keep on wait list as well.   He is very concerned as one of his friends just "fell" over and passed away from having an acute MI.     Patient aware of appt and will call back if he needs to reschedule due to work.

## 2017-12-11 NOTE — Telephone Encounter (Signed)
Pt stated that Dr Tresa Endo told him that he would get his nurse to make him an appt since he was booked. Please call pt with appt.

## 2017-12-13 ENCOUNTER — Ambulatory Visit: Payer: BLUE CROSS/BLUE SHIELD | Admitting: Cardiology

## 2017-12-20 ENCOUNTER — Telehealth: Payer: Self-pay | Admitting: *Deleted

## 2017-12-20 NOTE — Telephone Encounter (Signed)
Left message to call back to schedule ov with Dr. Tresa EndoKelly 6/20

## 2017-12-20 NOTE — Telephone Encounter (Signed)
Spoke to patient, scheduled to see Dr. Tresa EndoKelly 6/20 4 pm

## 2017-12-20 NOTE — Telephone Encounter (Signed)
Follow Up:; ° ° °Returning your call. °

## 2018-01-02 ENCOUNTER — Ambulatory Visit: Payer: BLUE CROSS/BLUE SHIELD | Admitting: Cardiovascular Disease

## 2018-01-02 ENCOUNTER — Encounter: Payer: Self-pay | Admitting: Cardiovascular Disease

## 2018-01-02 VITALS — BP 124/92 | HR 67 | Ht 72.0 in | Wt 273.6 lb

## 2018-01-02 DIAGNOSIS — E669 Obesity, unspecified: Secondary | ICD-10-CM

## 2018-01-02 DIAGNOSIS — R0602 Shortness of breath: Secondary | ICD-10-CM

## 2018-01-02 DIAGNOSIS — I10 Essential (primary) hypertension: Secondary | ICD-10-CM

## 2018-01-02 DIAGNOSIS — E7800 Pure hypercholesterolemia, unspecified: Secondary | ICD-10-CM

## 2018-01-02 DIAGNOSIS — R002 Palpitations: Secondary | ICD-10-CM | POA: Diagnosis not present

## 2018-01-02 DIAGNOSIS — I519 Heart disease, unspecified: Secondary | ICD-10-CM | POA: Diagnosis not present

## 2018-01-02 DIAGNOSIS — R0789 Other chest pain: Secondary | ICD-10-CM

## 2018-01-02 DIAGNOSIS — I5189 Other ill-defined heart diseases: Secondary | ICD-10-CM

## 2018-01-02 NOTE — Patient Instructions (Signed)
Medication Instructions: Your physician recommends that you continue on your current medications as directed. Please refer to the Current Medication list given to you today.  If you need a refill on your cardiac medications before your next appointment, please call your pharmacy.   Labwork: Your provider would like for you to return in one week to have the following labs drawn: FASTING lipid, free T3, free T4, TSH, CBC, CMET, and lipid.Marland Kitchen. You do not need an appointment for the lab. Once in our office lobby there is a podium where you can sign in and ring the doorbell to alert us that you are here. The lab is open from 8:00 am to 4:30 pm; closed for lunch from 12:45pm-1:45pm.  Follow-Up: Your physician wants you to follow-up in 6 months with Dr. Tresa EndoKelly. You will receive a reminder letter in the mail two months in advance. If you don't receive a letter, please call our office at [phone] to schedule this follow-up appointment.   Thank you for choosing [facility]!!

## 2018-01-02 NOTE — Progress Notes (Signed)
Patient ID: Joshua Simpson, male   DOB: [birth year], 47 y.o.   MRN: [id number]     Primary: Heide Scales, NP  PATIENT PROFILE: Joshua Simpson is a 47 y.o. male who presents for a 13 month follow-up cardiology evaluation.  HPI:  Joshua Simpson has a history of hypertension and in 2011 experience atypical chest pain.  He was evaluated by Dr. Sallyanne Kuster initially in  2011.  His chest pain was felt to be atypical and not exertionally related and most likely musculoskeletal.  It was felt that he had anxiety driven, sinus tachycardia and significant cardiac deconditioning.  His last cardiac evaluation was 5 years ago by Dr. Ellyn Hack.  The patient has issues with anxiety.  Over the last 3 weeks.  He is experiencing increased episodes of shortness of breath.  He denies chest pain.  He does have hypertension.  He has been awakened from sleep, particularly around 5 AM with his heart pounding.  He admits to very poor sleep.  His sleep is nonrestorative.  He does note some daytime sleepiness.  He snores and at times has awakened gasping for breath.  He has taken Xanax in the past for anxiety.  He has GERD for which he has taken pantoprazole.  He had recently been out of work after surgery which her surgery for [employer].  Because of his increasing symptoms associated with increased blood pressure, he was referred for cardiology evaluation.  When I saw him initially he had significant hypertension and his blood pressure was 150/110.  He had been on low-dose atenolol and I increased this to 25 mg in the morning and 12.5 mg in the evening.  I also started him on lisinopril at 5 mg daily.  He underwent an echo Doppler study which was done on 09/21/2015.  This showed an ejection fraction of 60-65% without wall motion abnormalities.  There was grade 2 diastolic dysfunction.  His aortic root was upper normal at 37 mm.  There was trivial MR.  When I saw him in April 2017 his blood pressure had stabilized and typically  runs in the 675 systolic range and in the 91M in the diastolic range at home.  He denied chest pain.  He does have anxiety issues and has taking alprazolam intermittently.  He has history of GERD for which he has been on pantoprazole.  I was concerned with sleep apnea but due to his lack of insurance he wished to defer evaluation.  He developed intermittent episodes of chest pain with atypical features which led to an emergency room evaluation February 2018.  Troponins were negative.  He did not have acute ECG changes, but he did have nonspecific lateral T-wave abnormality.  Chest x-ray did not reveal any significant abnormality.    Since I last saw him in May 2018 he had undergone a routine treadmill test which was negative for ischemia. He had been doing fairly well until February 2019 and he developed palpitations as well as episodes of chest discomfort episodes of awakening at night with his heart pounding and not sleeping well.  In the past I had recommended a sleep evaluation but he did decided against this.  He was seen by Jory Sims, NP for a 14-day monitor.  This revealed predominant sinus rhythm with an average rate at 60 bpm.  There were periods of sinus bradycardia, isolated PACs, and rare episodes of atrial couplets.  There was transient first-degree AV block.  There was no ventricular ectopy or episodes  of atrial fibrillation or significant pauses.  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  He had undergone cholecystectomy in January 2019.  Recently, he admits to shortness of breath with activity and admits to perspiring significantly.  He notes that sharp atypical chest pain which is nonexertional and short-lived.  He presents for reevaluation.  Past Medical History:  Diagnosis Date  . Anxiety   . Hypertension     Past Surgical History:  Procedure Laterality Date  . CHOLECYSTECTOMY N/A 08/14/2017   Procedure: LAPAROSCOPIC CHOLECYSTECTOMY WITH  INTRAOPERATIVE CHOLANGIOGRAM;  Surgeon: Donnie Mesa, MD;  Location: [facility];  Service: General;  Laterality: N/A;  . SHOULDER SURGERY      Allergies  Allergen Reactions  . Trolamine Salicylate Rash    Current Outpatient Medications  Medication Sig Dispense Refill  . ALPRAZolam (XANAX) 0.5 MG tablet Take 0.5 mg by mouth 2 (two) times daily.    Marland Kitchen atenolol (TENORMIN) 25 MG tablet Take 25 mg in am and 1/2 tablet ( 12.5 mg ) in pm 45 tablet 6  . cetirizine (ZYRTEC) 10 MG tablet Take 10 mg by mouth daily.    Marland Kitchen lisinopril (PRINIVIL,ZESTRIL) 10 MG tablet Take 0.5 tablets (5 mg total) by mouth daily. Takes one-half tablet 30 tablet 6  . ondansetron (ZOFRAN) 4 MG tablet Take 1 tablet (4 mg total) by mouth every 8 (eight) hours as needed for nausea or vomiting. 11 tablet 0  . pantoprazole (PROTONIX) 40 MG tablet Take 1 tablet (40 mg total) by mouth daily as needed (GERD). 30 tablet 6   No current facility-administered medications for this visit.     Social History   Socioeconomic History  . Marital status: Divorced    Spouse name: Not on file  . Number of children: Not on file  . Years of education: Not on file  . Highest education level: Not on file  Occupational History  . Not on file  Social Needs  . Financial resource strain: Not on file  . Food insecurity:    Worry: Not on file    Inability: Not on file  . Transportation needs:    Medical: Not on file    Non-medical: Not on file  Tobacco Use  . Smoking status: Never Smoker  . Smokeless tobacco: Never Used  Substance and Sexual Activity  . Alcohol use: No    Alcohol/week: 0.0 oz    Comment: occassionally  . Drug use: No  . Sexual activity: Not on file  Lifestyle  . Physical activity:    Days per week: Not on file    Minutes per session: Not on file  . Stress: Not on file  Relationships  . Social connections:    Talks on phone: Not on file    Gets together: Not on file    Attends religious service: Not on file     Active member of club or organization: Not on file    Attends meetings of clubs or organizations: Not on file    Relationship status: Not on file  . Intimate partner violence:    Fear of current or ex partner: Not on file    Emotionally abused: Not on file    Physically abused: Not on file    Forced sexual activity: Not on file  Other Topics Concern  . Not on file  Social History Narrative  . Not on file   Social history is notable that he is married for 18 years.  He  has one child.  He works at the [employer].  He completed 11th grade of education.  There is no history of tobacco use.  He does not routinely exercise but does walk occasionally.  Family History  Problem Relation Age of Onset  . Lung cancer Father    Family history is notable that both parents are living at age 43.  Mother has issues with anxiety.  Father had cancer of his kidney.  He is one brother age 80 and 1 sister, age 20.  His child is age 66.  ROS General: Negative; No fevers, chills, or night sweats HEENT: Negative; No changes in vision or hearing, sinus congestion, difficulty swallowing Pulmonary: Negative; No cough, wheezing, shortness of breath, hemoptysis Cardiovascular:  See HPI;  GI: Negative; No nausea, vomiting, diarrhea, or abdominal pain GU: Negative; No dysuria, hematuria, or difficulty voiding Musculoskeletal:Status post right rotator cuff surgery. Hematologic/Oncologic: Negative; no easy bruising, bleeding Endocrine: Negative; no heat/cold intolerance; no diabetes Neuro: Negative; no changes in balance, headaches Skin: Negative; No rashes or skin lesions Psychiatric: Positive for anxiety Sleep: Intermittent poor sleep; nohypersomnolence, bruxism, restless legs, hypnogagnic hallucinations Other comprehensive 14 point system review is negative   Physical Exam BP (!) 124/92   Pulse 67   Ht 6' (1.829 m)   Wt 273 lb 9.6 oz (124.1 kg)   SpO2 95%   BMI 37.11 kg/m    Repeat blood  pressure by me was 110/72.  Wt Readings from Last 3 Encounters:  01/02/18 273 lb 9.6 oz (124.1 kg)  10/21/17 275 lb (124.7 kg)  10/01/17 275 lb (124.7 kg)   General: Alert, oriented, no distress.  Skin: normal turgor, no rashes, warm and dry HEENT: Normocephalic, atraumatic. Pupils equal round and reactive to light; sclera anicteric; extraocular muscles intact;  Nose without nasal septal hypertrophy Mouth/Parynx benign; Mallinpatti scale 3 Neck: No JVD, no carotid bruits; normal carotid upstroke Lungs: clear to ausculatation and percussion; no wheezing or rales Chest wall: without tenderness to palpitation Heart: PMI not displaced, RRR, s1 s2 normal, 1/6 systolic murmur, no diastolic murmur, no rubs, gallops, thrills, or heaves Abdomen: soft, nontender; no hepatosplenomehaly, BS+; abdominal aorta nontender and not dilated by palpation. Back: no CVA tenderness Pulses 2+ Musculoskeletal: full range of motion, normal strength, no joint deformities Extremities: no clubbing cyanosis or edema, Homan's sign negative  Neurologic: grossly nonfocal; Cranial nerves grossly wnl Psychologic: Normal mood and affect   ECG (independently read by me): Sinus rhythm at 67 bpm.  Normal intervals.  No ectopy.  No ST segment changes.  May 2018 ECG (independently read by me): Normal sinus rhythm at 66 bpm.  QTc interval 425 ms, PR interval 192 ms.  No significant ST-T changes.  Mild nondiagnostic T change in lead 3.  April 2017 ECG (independently read by me): Normal sinus rhythm at 63 bpm.  Normal intervals.  January 2017 ECG (independently read by me): Normal sinus rhythm at 67 bpm.  QTc interval 452 ms.  No significant ST-T changes  LABS:  BMP Latest Ref Rng & Units 10/21/2017 08/15/2017 08/14/2017  Glucose 65 - 99 mg/dL 160(H) 168(H) 112(H)  BUN 6 - 20 mg/dL '10 10 9  '$ Creatinine 0.61 - 1.24 mg/dL 1.23 1.11 1.12  BUN/Creat Ratio 9 - 20 - - -  Sodium 135 - 145 mmol/L 137 138 140  Potassium 3.5 - 5.1  mmol/L 3.5 4.6 4.2  Chloride 101 - 111 mmol/L 104 106 107  CO2 22 - 32 mmol/L 23 22  23  Calcium 8.9 - 10.3 mg/dL 9.1 9.3 9.4    Hepatic Function Latest Ref Rng & Units 10/21/2017 08/15/2017 12/25/2016  Total Protein 6.5 - 8.1 g/dL 7.3 6.0(L) 7.1  Albumin 3.5 - 5.0 g/dL 4.5 3.6 4.7  AST 15 - 41 U/L 25 44(H) 23  ALT 17 - 63 U/L 31 41 24  Alk Phosphatase 38 - 126 U/L 85 53 81  Total Bilirubin 0.3 - 1.2 mg/dL 1.8(H) 1.4(H) 1.1    CBC Latest Ref Rng & Units 10/21/2017 08/15/2017 08/14/2017  WBC 4.0 - 10.5 K/uL 15.4(H) 8.5 6.7  Hemoglobin 13.0 - 17.0 g/dL 15.5 14.5 17.3(H)  Hematocrit 39.0 - 52.0 % 45.5 41.6 48.6  Platelets 150 - 400 K/uL 192 181 218   Lab Results  Component Value Date   MCV 93.0 10/21/2017   MCV 93.1 08/15/2017   MCV 92.2 08/14/2017   Lab Results  Component Value Date   TSH 2.010 12/25/2016   No results found for: HGBA1C   BNP No results found for: BNP  ProBNP No results found for: PROBNP   Lipid Panel     Component Value Date/Time   CHOL 220 (H) 12/25/2016 1054   TRIG 135 12/25/2016 1054   HDL 52 12/25/2016 1054   CHOLHDL 4.2 12/25/2016 1054   LDLCALC 141 (H) 12/25/2016 1054    RADIOLOGY: No results found.  IMPRESSION:  1. Essential hypertension   2. Elevated LDL cholesterol level   3. Palpitations   4. Obesity (BMI 30-39.9)   5. Atypical chest pain   6. Grade II diastolic dysfunction   7. SOB (shortness of breath)      ASSESSMENT AND PLAN: [name] is a 47 year old gentleman who has a history of obesity, as well as hypertension and anxiety.  He had noticed some episodes of increasing shortness of breath and palpitations.  He also has been awakened from sleep with his heart pounding and notes significant snoring as well as awakening gasping for breath.  When I saw him initially, he had significant hypertension.  His blood pressure today is stable on his regimen consisting of atenolol 25 mg in the morning and 12.5 mg in the evening in  addition to lisinopril 5 mg daily.  He has experienced intermittent episodes of atypical chest pain.  A routine treadmill test was negative for ischemia but had demonstrated direct systolic hypertension with stress.  His echo Doppler study has shown normal systolic function with grade 2 diastolic dysfunction.  He has had issues with intermittent poor sleep.  We had discussed sleep study in the past but he had declined due to financial constraints.  I reviewed his Holter monitor study with him in detail from earlier this year.  This revealed predominant sinus rhythm isolated PACs, rare atrial couplets without high-grade ectopy, AF, or pauses.  Palpitations have improved with his nocturnal dosing of atenolol.  Laboratory from earlier this year had shown elevated glucose.  With his recent increasing perspiration frequent sweating I am rechecking chemistry profile, lipids TSH, free T3, and free T4 level in addition to a CBC.  He is moderately obese.  Exercise and weight loss was recommended.  If lipid studies are increased lipid-lowering therapy will be initiated I will see him in 4 to 6 months for reevaluation or sooner if problems occur.   Time spent: 25 minutes Troy Sine, MD, [facility] 01/04/2018 8:11 AM

## 2018-01-04 ENCOUNTER — Encounter: Payer: Self-pay | Admitting: Cardiovascular Disease

## 2018-01-09 LAB — COMPREHENSIVE METABOLIC PANEL
A/G RATIO: 2.1 (ref 1.2–2.2)
ALT: 44 IU/L (ref 0–44)
AST: 32 IU/L (ref 0–40)
Albumin: 4.9 g/dL (ref 3.5–5.5)
Alkaline Phosphatase: 81 IU/L (ref 39–117)
BUN/Creatinine Ratio: 10 (ref 9–20)
BUN: 11 mg/dL (ref 6–24)
Bilirubin Total: 1.1 mg/dL (ref 0.0–1.2)
CALCIUM: 9.6 mg/dL (ref 8.7–10.2)
CO2: 22 mmol/L (ref 20–29)
Chloride: 105 mmol/L (ref 96–106)
Creatinine, Ser: 1.15 mg/dL (ref 0.76–1.27)
GFR calc Af Amer: 88 mL/min/{1.73_m2} (ref >59)
GFR, EST NON AFRICAN AMERICAN: 76 mL/min/{1.73_m2} (ref >59)
GLOBULIN, TOTAL: 2.3 g/dL (ref 1.5–4.5)
Glucose: 100 mg/dL — ABNORMAL HIGH (ref 65–99)
POTASSIUM: 4.9 mmol/L (ref 3.5–5.2)
Sodium: 141 mmol/L (ref 134–144)
TOTAL PROTEIN: 7.2 g/dL (ref 6.0–8.5)

## 2018-01-09 LAB — CBC
HEMATOCRIT: 46.8 % (ref 37.5–51.0)
Hemoglobin: 16.6 g/dL (ref 13.0–17.7)
MCH: 32.8 pg (ref 26.6–33.0)
MCHC: 35.5 g/dL (ref 31.5–35.7)
MCV: 93 fL (ref 79–97)
Platelets: 202 10*3/uL (ref 150–450)
RBC: 5.06 x10E6/uL (ref 4.14–5.80)
RDW: 13.2 % (ref 12.3–15.4)
WBC: 5.1 10*3/uL (ref 3.4–10.8)

## 2018-01-09 LAB — LIPID PANEL
Chol/HDL Ratio: 3.9 ratio (ref 0.0–5.0)
Cholesterol, Total: 216 mg/dL — ABNORMAL HIGH (ref 100–199)
HDL: 55 mg/dL (ref >39)
LDL Calculated: 143 mg/dL — ABNORMAL HIGH (ref 0–99)
Triglycerides: 91 mg/dL (ref 0–149)
VLDL Cholesterol Cal: 18 mg/dL (ref 5–40)

## 2018-01-09 LAB — T3, FREE: T3 FREE: 3.2 pg/mL (ref 2.0–4.4)

## 2018-01-09 LAB — T4, FREE: Free T4: 1.06 ng/dL (ref 0.82–1.77)

## 2018-01-09 LAB — TSH: TSH: 1.11 u[IU]/mL (ref 0.450–4.500)

## 2018-01-21 ENCOUNTER — Telehealth: Payer: Self-pay | Admitting: Cardiovascular Disease

## 2018-01-21 DIAGNOSIS — E785 Hyperlipidemia, unspecified: Secondary | ICD-10-CM

## 2018-01-21 DIAGNOSIS — Z79899 Other long term (current) drug therapy: Secondary | ICD-10-CM

## 2018-01-21 MED ORDER — ROSUVASTATIN CALCIUM 20 MG PO TABS: 20.0000 mg | ORAL_TABLET | Freq: Every day | ORAL | 3 refills | Status: DC

## 2018-01-21 NOTE — Telephone Encounter (Signed)
New Message   Pt returning call for [name] from 01/14/18

## 2018-01-21 NOTE — Telephone Encounter (Signed)
Returned call to patient, lab results and recommendations reviewed.  Patient verbalized understanding.  He states he has taken atorvastatin in the past and he experienced leg pain with this.  He is willing to try rosuvastatin and see if he tolerates this.   Advised to take every other day x 1-2 weeks then increase if tolerating okay.   rx sent to pharmacy, labs ordered and will mail to patient for repeat in 2-3 months.

## 2018-01-21 NOTE — Telephone Encounter (Signed)
Pt states she has already spoken to CliffordHaley for labs results.

## 2018-04-04 ENCOUNTER — Telehealth: Payer: Self-pay | Admitting: Cardiovascular Disease

## 2018-04-04 NOTE — Telephone Encounter (Signed)
Called patient wife back along with patient, she states that he has swelling in his legs, and his primary care just placed him on HCTZ, patient states he his SOB at times, and does have his episodes as before but they come and go, patient was advised if he started having chest pains to go to the ER. Patient was advised to watch diet/salt intake, and to elevate legs, and compression stockings. Patient was made an appointment at next available PA appointment on Oct 2nd. Patient verbalized they would be here.

## 2018-04-04 NOTE — Telephone Encounter (Signed)
New Message        Pt c/o swelling: STAT is pt has developed SOB within 24 hours  1) How much weight have you gained and in what time span? None  2) If swelling, where is the swelling located?  From knees to feet  3) Are you currently taking a fluid pill? Yes  4) Are you currently SOB? YES  5) Do you have a log of your daily weights (if so, list)? No  6) Have you gained 3 pounds in a day or 5 pounds in a week? NO               Patient still having  PAC'  7) Have you traveled recently? No

## 2018-04-04 NOTE — Telephone Encounter (Signed)
Attempted to contact patient, did not answer, LVM to return call, left call back number.

## 2018-04-16 ENCOUNTER — Ambulatory Visit: Payer: BLUE CROSS/BLUE SHIELD | Admitting: Physician Assistant

## 2018-05-06 ENCOUNTER — Encounter

## 2018-05-14 ENCOUNTER — Other Ambulatory Visit: Payer: Self-pay | Admitting: Adult Health

## 2018-05-15 ENCOUNTER — Ambulatory Visit (INDEPENDENT_AMBULATORY_CARE_PROVIDER_SITE_OTHER): Payer: 59 | Admitting: Physician Assistant

## 2018-05-15 VITALS — BP 118/84 | HR 70 | Resp 16 | Ht 72.0 in | Wt 270.0 lb

## 2018-05-15 DIAGNOSIS — I10 Essential (primary) hypertension: Secondary | ICD-10-CM

## 2018-05-15 DIAGNOSIS — E7800 Pure hypercholesterolemia, unspecified: Secondary | ICD-10-CM | POA: Diagnosis not present

## 2018-05-15 DIAGNOSIS — R072 Precordial pain: Secondary | ICD-10-CM

## 2018-05-15 MED ORDER — NITROGLYCERIN 0.4 MG SL SUBL: 0.4000 mg | SUBLINGUAL_TABLET | SUBLINGUAL | 4 refills | Status: DC | PRN

## 2018-05-15 MED ORDER — PRAVASTATIN SODIUM 80 MG PO TABS: 80.0000 mg | ORAL_TABLET | Freq: Every evening | ORAL | 1 refills | Status: DC

## 2018-05-15 NOTE — Progress Notes (Signed)
Cardiology Office Note    Date:  05/17/2018   ID:  Joshua Simpson, DOB March 31, 1971, MRN [id number]  PCP:  Mikael Spray, NP  Cardiologist:  Dr. Tresa Endo  Chief Complaint  Patient presents with  . Leg Swelling  . Shortness of Breath    seen for Dr. Tresa Endo    History of Present Illness:  Joshua Simpson is a 47 y.o. male with PMH of anxiety and hypertension.  Patient was initially evaluated by Dr. Royann Shivers in 2011 for atypical chest pain.  It was felt this is likely musculoskeletal issue.  He was also evaluated by Dr. Herbie Baltimore 5 years ago as well.  Last echocardiogram obtained in 2017 showed EF 60 to 65%, grade 2 DD, aortic root and near upper limit of normal at 37 mm, trivial MR.  Treadmill stress test in 2018 was negative.  He also had a 14-day heart monitor that showed predominantly sinus rhythm with average heart rate 60 bpm, appears to sinus bradycardia, isolated PACs and rare episode of atrial couplet.  There was transient first-degree AV block.  However no significant ventricular ectopy or episodes of atrial fibrillation.  He underwent cholecystectomy in January 2019.  Patient was last seen by Dr. Tresa Endo in June 2019 for evaluation of increasing episodes of shortness of breath.  He admits to very poor sleep.   Patient presents today for cardiology office visit.  He has been having worsening chest pain both at rest and with exertion in the past 2 months.  He also noticed some dyspnea with exertion as well.  This has been worsening.  I discussed coronary CT versus stress test versus cardiac catheterization with the patient.  He is quite concerned of his symptom.  I discussed with Dr. Tresa Endo as well regarding his symptoms.  We eventually recommended definitive evaluation via cardiac catheterization given recurrent chest pain throughout the past several years with sign of progression.    Past Medical History:  Diagnosis Date  . Anxiety   . Hypertension     Past Surgical History:  Procedure  Laterality Date  . CHOLECYSTECTOMY N/A 08/14/2017   Procedure: LAPAROSCOPIC CHOLECYSTECTOMY WITH INTRAOPERATIVE CHOLANGIOGRAM;  Surgeon: Manus Rudd, MD;  Location: [facility] OR;  Service: General;  Laterality: N/A;  . SHOULDER SURGERY      Current Medications: Outpatient Medications Prior to Visit  Medication Sig Dispense Refill  . ALPRAZolam (XANAX) 0.5 MG tablet Take 0.5 mg by mouth 2 (two) times daily.    Marland Kitchen atenolol (TENORMIN) 25 MG tablet TAKE 1 TABLET BY MOUTH IN THE MORNING AND 1/2 IN THE EVENING 135 tablet 1  . cetirizine (ZYRTEC) 10 MG tablet Take 10 mg by mouth daily.    Marland Kitchen lisinopril (PRINIVIL,ZESTRIL) 10 MG tablet Take 0.5 tablets (5 mg total) by mouth daily. Takes one-half tablet 30 tablet 6  . ondansetron (ZOFRAN) 4 MG tablet Take 1 tablet (4 mg total) by mouth every 8 (eight) hours as needed for nausea or vomiting. 11 tablet 0  . pantoprazole (PROTONIX) 40 MG tablet Take 1 tablet (40 mg total) by mouth daily as needed (GERD). 30 tablet 6  . rosuvastatin (CRESTOR) 20 MG tablet Take 1 tablet (20 mg total) by mouth daily. 30 tablet 3   No facility-administered medications prior to visit.      Allergies:   Trolamine salicylate   Social History   Socioeconomic History  . Marital status: Divorced    Spouse name: Not on file  . Number of children: Not on file  .  Years of education: Not on file  . Highest education level: Not on file  Occupational History  . Not on file  Social Needs  . Financial resource strain: Not on file  . Food insecurity:    Worry: Not on file    Inability: Not on file  . Transportation needs:    Medical: Not on file    Non-medical: Not on file  Tobacco Use  . Smoking status: Never Smoker  . Smokeless tobacco: Never Used  Substance and Sexual Activity  . Alcohol use: No    Alcohol/week: 0.0 standard drinks    Comment: occassionally  . Drug use: No  . Sexual activity: Not on file  Lifestyle  . Physical activity:    Days per week: Not on file      Minutes per session: Not on file  . Stress: Not on file  Relationships  . Social connections:    Talks on phone: Not on file    Gets together: Not on file    Attends religious service: Not on file    Active member of club or organization: Not on file    Attends meetings of clubs or organizations: Not on file    Relationship status: Not on file  Other Topics Concern  . Not on file  Social History Narrative  . Not on file     Family History:  The patient's family history includes Lung cancer in his father.   ROS:   Please see the history of present illness.    ROS All other systems reviewed and are negative.   PHYSICAL EXAM:   VS:  BP 118/84   Pulse 70   Resp 16   Ht 6' (1.829 m)   Wt 270 lb (122.5 kg)   SpO2 96%   BMI 36.62 kg/m    GEN: Well nourished, well developed, in no acute distress  HEENT: normal  Neck: no JVD, carotid bruits, or masses Cardiac: RRR; no murmurs, rubs, or gallops,no edema  Respiratory:  clear to auscultation bilaterally, normal work of breathing GI: soft, nontender, nondistended, + BS MS: no deformity or atrophy  Skin: warm and dry, no rash Neuro:  Alert and Oriented x 3, Strength and sensation are intact Psych: euthymic mood, full affect  Wt Readings from Last 3 Encounters:  05/15/18 270 lb (122.5 kg)  01/02/18 273 lb 9.6 oz (124.1 kg)  10/21/17 275 lb (124.7 kg)      Studies/Labs Reviewed:   EKG:  EKG is ordered today.  The ekg ordered today demonstrates normal sinus rhythm, no significant ST-T wave changes  Recent Labs: 01/09/2018: ALT 44; BUN 11; Creatinine, Ser 1.15; Hemoglobin 16.6; Platelets 202; Potassium 4.9; Sodium 141; TSH 1.110   Lipid Panel    Component Value Date/Time   CHOL 216 (H) 01/09/2018 0951   TRIG 91 01/09/2018 0951   HDL 55 01/09/2018 0951   CHOLHDL 3.9 01/09/2018 0951   LDLCALC 143 (H) 01/09/2018 0951    Additional studies/ records that were reviewed today include:   Echo 09/21/2015 LV EF: 60% -    65% Study Conclusions  - Left ventricle: The cavity size was normal. Wall thickness was   increased in a pattern of mild LVH. Systolic function was normal.   The estimated ejection fraction was in the range of 60% to 65%.   Wall motion was normal; there were no regional wall motion   abnormalities. Features are consistent with a pseudonormal left   ventricular filling pattern, with  concomitant abnormal relaxation   and increased filling pressure (grade 2 diastolic dysfunction). - Aortic valve: There was no stenosis. - Aorta: Borderline dilated aortic root. Aortic root dimension: 37   mm (ED). - Mitral valve: There was trivial regurgitation. - Right ventricle: The cavity size was mildly dilated. Systolic   function was normal. - Pulmonary arteries: No complete TR doppler jet so unable to   estimate PA systolic pressure. - Inferior vena cava: The vessel was normal in size. The   respirophasic diameter changes were in the normal range (= 50%),   consistent with normal central venous pressure.  Impressions:  - Normal LV size with mild LV hypertrophy. EF 60-65%. Moderate   diastolic dysfunction. Mildly dilated RV with normal systolic   function. No significant valvular abnormalities.   ETT 12/25/2016  Blood pressure demonstrated a hypertensive response to exercise.  There was no ST segment deviation noted during stress.   Normal ETT with no evidence of ischemia Diastolic HTN with stress   ASSESSMENT:    1. Precordial pain   2. Elevated LDL cholesterol level   3. Essential hypertension      PLAN:  In order of problems listed above:  1. Precordial chest pain: Patient has both typical and atypical features.  Symptom has been intermittent for the past several years.  However in the past 1 to 2 months, he has been noticing worsening symptom both at rest and with exertion.  It is also accompanied by dyspnea on exertion as well.  He is quite concerned of his symptom.  I  discussed the case both with the patient and Dr. Tresa Endo, we discussed nuclear stress test versus coronary CT versus cardiac catheterization.  Given his body size, coronary CT is not a very good option.  Eventually we recommended cardiac catheterization.  - Risk and benefit of procedure explained to the patient who display clear understanding and agree to proceed.  Discussed with patient possible procedural risk include bleeding, vascular injury, renal injury, arrythmia, MI, stroke and loss of limb or life.  2. Hyperlipidemia: Started on Pravachol.  He has been started having some side effect with Crestor.  Will stop Crestor  3. Hypertension: Blood pressure well controlled.    Medication Adjustments/Labs and Tests Ordered: Current medicines are reviewed at length with the patient today.  Concerns regarding medicines are outlined above.  Medication changes, Labs and Tests ordered today are listed in the Patient Instructions below. Patient Instructions     [facility] CARDIOVASCULAR DIVISION [facility] [address] [place] [address] [place] [address] Dept: [phone] Loc: [phone]  Joshua Simpson  05/15/2018  You are scheduled for a Cardiac Catheterization on Monday, November 11 with Dr. Nicki Guadalajara.  1. Please arrive at the [facility] (Main Entrance A) at [facility] [address] [place], [place] [address] at 5:30 AM (This time is two hours before your procedure to ensure your preparation). Free valet parking service is available.   Special note: Every effort is made to have your procedure done on time. Please understand that emergencies sometimes delay scheduled procedures.  2. Diet: Do not eat solid foods after midnight.  The patient may have clear liquids until 5am upon the day of the procedure.  3. Labs: You will need to have blood drawn NEXT WEEK. You do not need to be fasting.  4. Medication instructions in preparation for  your procedure:  STOP Rosuvastatin   START Pravastatin 80 mg daily.  START Nitroglycerin SUBL 0.4 mg---take 1 tablet every five minutes as needed for chest pain up to three times.    Contrast Allergy: No  On the morning of your procedure, take any morning medicines.  You may use sips of water.  5. Plan for one night stay--bring personal belongings. 6. Bring a current list of your medications and current insurance cards. 7. You MUST have a responsible person to drive you home. 8. Someone MUST be with you the first 24 hours after you arrive home or your discharge will be delayed. 9. Please wear clothes that are easy to get on and off and wear slip-on shoes.  Thank you for allowing [place] to care for you!   -- [facility] Invasive Cardiovascular services     Signed, Azalee Course, [place]  05/17/2018 11:44 PM    [facility] [address] [place], [place], [place]  [address] Phone: [phone]; Fax: [phone]

## 2018-05-15 NOTE — H&P (View-Only) (Signed)
Cardiology Office Note    Date:  05/17/2018   ID:  Joshua Simpson, DOB 07-21-1970, MRN [id number]  PCP:  Mikael Spray, NP  Cardiologist:  Dr. Tresa Endo  Chief Complaint  Patient presents with  . Leg Swelling  . Shortness of Breath    seen for Dr. Tresa Endo    History of Present Illness:  Joshua Simpson is a 47 y.o. male with PMH of anxiety and hypertension.  Patient was initially evaluated by Dr. Royann Shivers in 2011 for atypical chest pain.  It was felt this is likely musculoskeletal issue.  He was also evaluated by Dr. Herbie Baltimore 5 years ago as well.  Last echocardiogram obtained in 2017 showed EF 60 to 65%, grade 2 DD, aortic root and near upper limit of normal at 37 mm, trivial MR.  Treadmill stress test in 2018 was negative.  He also had a 14-day heart monitor that showed predominantly sinus rhythm with average heart rate 60 bpm, appears to sinus bradycardia, isolated PACs and rare episode of atrial couplet.  There was transient first-degree AV block.  However no significant ventricular ectopy or episodes of atrial fibrillation.  He underwent cholecystectomy in January 2019.  Patient was last seen by Dr. Tresa Endo in June 2019 for evaluation of increasing episodes of shortness of breath.  He admits to very poor sleep.   Patient presents today for cardiology office visit.  He has been having worsening chest pain both at rest and with exertion in the past 2 months.  He also noticed some dyspnea with exertion as well.  This has been worsening.  I discussed coronary CT versus stress test versus cardiac catheterization with the patient.  He is quite concerned of his symptom.  I discussed with Dr. Tresa Endo as well regarding his symptoms.  We eventually recommended definitive evaluation via cardiac catheterization given recurrent chest pain throughout the past several years with sign of progression.    Past Medical History:  Diagnosis Date  . Anxiety   . Hypertension     Past Surgical History:  Procedure  Laterality Date  . CHOLECYSTECTOMY N/A 08/14/2017   Procedure: LAPAROSCOPIC CHOLECYSTECTOMY WITH INTRAOPERATIVE CHOLANGIOGRAM;  Surgeon: Manus Rudd, MD;  Location: [facility] OR;  Service: General;  Laterality: N/A;  . SHOULDER SURGERY      Current Medications: Outpatient Medications Prior to Visit  Medication Sig Dispense Refill  . ALPRAZolam (XANAX) 0.5 MG tablet Take 0.5 mg by mouth 2 (two) times daily.    Marland Kitchen atenolol (TENORMIN) 25 MG tablet TAKE 1 TABLET BY MOUTH IN THE MORNING AND 1/2 IN THE EVENING 135 tablet 1  . cetirizine (ZYRTEC) 10 MG tablet Take 10 mg by mouth daily.    Marland Kitchen lisinopril (PRINIVIL,ZESTRIL) 10 MG tablet Take 0.5 tablets (5 mg total) by mouth daily. Takes one-half tablet 30 tablet 6  . ondansetron (ZOFRAN) 4 MG tablet Take 1 tablet (4 mg total) by mouth every 8 (eight) hours as needed for nausea or vomiting. 11 tablet 0  . pantoprazole (PROTONIX) 40 MG tablet Take 1 tablet (40 mg total) by mouth daily as needed (GERD). 30 tablet 6  . rosuvastatin (CRESTOR) 20 MG tablet Take 1 tablet (20 mg total) by mouth daily. 30 tablet 3   No facility-administered medications prior to visit.      Allergies:   Trolamine salicylate   Social History   Socioeconomic History  . Marital status: Divorced    Spouse name: Not on file  . Number of children: Not on file  .  Years of education: Not on file  . Highest education level: Not on file  Occupational History  . Not on file  Social Needs  . Financial resource strain: Not on file  . Food insecurity:    Worry: Not on file    Inability: Not on file  . Transportation needs:    Medical: Not on file    Non-medical: Not on file  Tobacco Use  . Smoking status: Never Smoker  . Smokeless tobacco: Never Used  Substance and Sexual Activity  . Alcohol use: No    Alcohol/week: 0.0 standard drinks    Comment: occassionally  . Drug use: No  . Sexual activity: Not on file  Lifestyle  . Physical activity:    Days per week: Not on file      Minutes per session: Not on file  . Stress: Not on file  Relationships  . Social connections:    Talks on phone: Not on file    Gets together: Not on file    Attends religious service: Not on file    Active member of club or organization: Not on file    Attends meetings of clubs or organizations: Not on file    Relationship status: Not on file  Other Topics Concern  . Not on file  Social History Narrative  . Not on file     Family History:  The patient's family history includes Lung cancer in his father.   ROS:   Please see the history of present illness.    ROS All other systems reviewed and are negative.   PHYSICAL EXAM:   VS:  BP 118/84   Pulse 70   Resp 16   Ht 6' (1.829 m)   Wt 270 lb (122.5 kg)   SpO2 96%   BMI 36.62 kg/m    GEN: Well nourished, well developed, in no acute distress  HEENT: normal  Neck: no JVD, carotid bruits, or masses Cardiac: RRR; no murmurs, rubs, or gallops,no edema  Respiratory:  clear to auscultation bilaterally, normal work of breathing GI: soft, nontender, nondistended, + BS MS: no deformity or atrophy  Skin: warm and dry, no rash Neuro:  Alert and Oriented x 3, Strength and sensation are intact Psych: euthymic mood, full affect  Wt Readings from Last 3 Encounters:  05/15/18 270 lb (122.5 kg)  01/02/18 273 lb 9.6 oz (124.1 kg)  10/21/17 275 lb (124.7 kg)      Studies/Labs Reviewed:   EKG:  EKG is ordered today.  The ekg ordered today demonstrates normal sinus rhythm, no significant ST-T wave changes  Recent Labs: 01/09/2018: ALT 44; Simpson 11; Creatinine, Ser 1.15; Hemoglobin 16.6; Platelets 202; Potassium 4.9; Sodium 141; TSH 1.110   Lipid Panel    Component Value Date/Time   CHOL 216 (H) 01/09/2018 0951   TRIG 91 01/09/2018 0951   HDL 55 01/09/2018 0951   CHOLHDL 3.9 01/09/2018 0951   LDLCALC 143 (H) 01/09/2018 0951    Additional studies/ records that were reviewed today include:   Echo 09/21/2015 LV EF: 60% -    65% Study Conclusions  - Left ventricle: The cavity size was normal. Wall thickness was   increased in a pattern of mild LVH. Systolic function was normal.   The estimated ejection fraction was in the range of 60% to 65%.   Wall motion was normal; there were no regional wall motion   abnormalities. Features are consistent with a pseudonormal left   ventricular filling pattern, with  concomitant abnormal relaxation   and increased filling pressure (grade 2 diastolic dysfunction). - Aortic valve: There was no stenosis. - Aorta: Borderline dilated aortic root. Aortic root dimension: 37   mm (ED). - Mitral valve: There was trivial regurgitation. - Right ventricle: The cavity size was mildly dilated. Systolic   function was normal. - Pulmonary arteries: No complete TR doppler jet so unable to   estimate PA systolic pressure. - Inferior vena cava: The vessel was normal in size. The   respirophasic diameter changes were in the normal range (= 50%),   consistent with normal central venous pressure.  Impressions:  - Normal LV size with mild LV hypertrophy. EF 60-65%. Moderate   diastolic dysfunction. Mildly dilated RV with normal systolic   function. No significant valvular abnormalities.   ETT 12/25/2016  Blood pressure demonstrated a hypertensive response to exercise.  There was no ST segment deviation noted during stress.   Normal ETT with no evidence of ischemia Diastolic HTN with stress   ASSESSMENT:    1. Precordial pain   2. Elevated LDL cholesterol level   3. Essential hypertension      PLAN:  In order of problems listed above:  1. Precordial chest pain: Patient has both typical and atypical features.  Symptom has been intermittent for the past several years.  However in the past 1 to 2 months, he has been noticing worsening symptom both at rest and with exertion.  It is also accompanied by dyspnea on exertion as well.  He is quite concerned of his symptom.  I  discussed the case both with the patient and Dr. Tresa Endo, we discussed nuclear stress test versus coronary CT versus cardiac catheterization.  Given his body size, coronary CT is not a very good option.  Eventually we recommended cardiac catheterization.  - Risk and benefit of procedure explained to the patient who display clear understanding and agree to proceed.  Discussed with patient possible procedural risk include bleeding, vascular injury, renal injury, arrythmia, MI, stroke and loss of limb or life.  2. Hyperlipidemia: Started on Pravachol.  He has been started having some side effect with Crestor.  Will stop Crestor  3. Hypertension: Blood pressure well controlled.    Medication Adjustments/Labs and Tests Ordered: Current medicines are reviewed at length with the patient today.  Concerns regarding medicines are outlined above.  Medication changes, Labs and Tests ordered today are listed in the Patient Instructions below. Patient Instructions     [facility] MEDICAL GROUP [facility] CARDIOVASCULAR DIVISION [facility] [address] [place] [address] [place] [address] Dept: [phone] Loc: [phone]  Joshua Simpson  05/15/2018  You are scheduled for a Cardiac Catheterization on Monday, November 11 with Dr. Nicki Guadalajara.  1. Please arrive at the [facility] (Main Entrance A) at [facility]: [address] [place], [place] [address] at 5:30 AM (This time is two hours before your procedure to ensure your preparation). Free valet parking service is available.   Special note: Every effort is made to have your procedure done on time. Please understand that emergencies sometimes delay scheduled procedures.  2. Diet: Do not eat solid foods after midnight.  The patient may have clear liquids until 5am upon the day of the procedure.  3. Labs: You will need to have blood drawn NEXT WEEK. You do not need to be fasting.  4. Medication instructions in preparation for  your procedure:  STOP Rosuvastatin   START Pravastatin 80 mg daily.  START Nitroglycerin SUBL 0.4 mg---take 1 tablet every five minutes as needed for chest pain up to three times.    Contrast Allergy: No  On the morning of your procedure, take any morning medicines.  You may use sips of water.  5. Plan for one night stay--bring personal belongings. 6. Bring a current list of your medications and current insurance cards. 7. You MUST have a responsible person to drive you home. 8. Someone MUST be with you the first 24 hours after you arrive home or your discharge will be delayed. 9. Please wear clothes that are easy to get on and off and wear slip-on shoes.  Thank you for allowing [place] to care for you!   -- [facility] Invasive Cardiovascular services     Signed, Azalee Course, [place]  05/17/2018 11:44 PM    [facility] [address] [place], [place], [place]  [address] Phone: [phone]; Fax: [phone]

## 2018-05-15 NOTE — Telephone Encounter (Signed)
Rx has been sent to the pharmacy electronically. ° °

## 2018-05-15 NOTE — Patient Instructions (Signed)
    [place] MEDICAL GROUP [facility] CARDIOVASCULAR DIVISION [facility] [address] [place] [address] [place] [address] Dept: [phone] Loc: [phone]  [place] A Rosengren  05/15/2018  You are scheduled for a Cardiac Catheterization on Monday, November 11 with Dr. Nicki Guadalajara.  1. Please arrive at the [facility] (Main Entrance A) at [facility]: [address] [place], [place] [address] at 5:30 AM (This time is two hours before your procedure to ensure your preparation). Free valet parking service is available.   Special note: Every effort is made to have your procedure done on time. Please understand that emergencies sometimes delay scheduled procedures.  2. Diet: Do not eat solid foods after midnight.  The patient may have clear liquids until 5am upon the day of the procedure.  3. Labs: You will need to have blood drawn NEXT WEEK. You do not need to be fasting.  4. Medication instructions in preparation for your procedure:  STOP Rosuvastatin   START Pravastatin 80 mg daily.  START Nitroglycerin SUBL 0.4 mg---take 1 tablet every five minutes as needed for chest pain up to three times.    Contrast Allergy: No  On the morning of your procedure, take any morning medicines.  You may use sips of water.  5. Plan for one night stay--bring personal belongings. 6. Bring a current list of your medications and current insurance cards. 7. You MUST have a responsible person to drive you home. 8. Someone MUST be with you the first 24 hours after you arrive home or your discharge will be delayed. 9. Please wear clothes that are easy to get on and off and wear slip-on shoes.  Thank you for allowing [place] to care for you!   -- [place] Invasive Cardiovascular services

## 2018-05-17 ENCOUNTER — Encounter: Payer: Self-pay | Admitting: Physician Assistant

## 2018-05-22 ENCOUNTER — Telehealth: Payer: Self-pay | Admitting: *Deleted

## 2018-05-22 ENCOUNTER — Other Ambulatory Visit: Payer: Self-pay | Admitting: *Deleted

## 2018-05-22 DIAGNOSIS — Z01812 Encounter for preprocedural laboratory examination: Secondary | ICD-10-CM

## 2018-05-22 DIAGNOSIS — R072 Precordial pain: Secondary | ICD-10-CM

## 2018-05-22 NOTE — Telephone Encounter (Addendum)
Pt contacted pre-catheterization scheduled at [facility] for: Monday May 26, 2018 7:30 AM Verified arrival time and place: [facility] - Closed Main Entrance A at: 5:30 AM  No solid food after midnight prior to cath, clear liquids until 5 AM day of procedure. Contrast allergy: no Verified no diabetes medications.  AM meds can be  taken pre-cath with sip of water including: ASA 81 mg-pt states he has taken and tolerated aspirin in the past.  Confirmed patient has responsible person to drive home post procedure and for 24 hours after you arrive home: yes

## 2018-05-23 LAB — BASIC METABOLIC PANEL
BUN / CREAT RATIO: 9 (ref 9–20)
BUN: 10 mg/dL (ref 6–24)
CO2: 24 mmol/L (ref 20–29)
Calcium: 9.5 mg/dL (ref 8.7–10.2)
Chloride: 101 mmol/L (ref 96–106)
Creatinine, Ser: 1.1 mg/dL (ref 0.76–1.27)
GFR calc Af Amer: 93 mL/min/{1.73_m2} (ref >59)
GFR, EST NON AFRICAN AMERICAN: 80 mL/min/{1.73_m2} (ref >59)
GLUCOSE: 107 mg/dL — AB (ref 65–99)
Potassium: 4.3 mmol/L (ref 3.5–5.2)
Sodium: 140 mmol/L (ref 134–144)

## 2018-05-23 LAB — CBC
Hematocrit: 46.1 % (ref 37.5–51.0)
Hemoglobin: 16.2 g/dL (ref 13.0–17.7)
MCH: 32.1 pg (ref 26.6–33.0)
MCHC: 35.1 g/dL (ref 31.5–35.7)
MCV: 91 fL (ref 79–97)
Platelets: 225 10*3/uL (ref 150–450)
RBC: 5.05 x10E6/uL (ref 4.14–5.80)
RDW: 12.6 % (ref 12.3–15.4)
WBC: 7.1 10*3/uL (ref 3.4–10.8)

## 2018-05-26 ENCOUNTER — Encounter (HOSPITAL_COMMUNITY): Payer: Self-pay | Admitting: Cardiovascular Disease

## 2018-05-26 ENCOUNTER — Other Ambulatory Visit: Payer: Self-pay

## 2018-05-26 ENCOUNTER — Ambulatory Visit (HOSPITAL_COMMUNITY)
Admission: RE | Admit: 2018-05-26 | Discharge: 2018-05-26 | Disposition: A | Discharge status: Home / Self Care | Payer: 59 | Source: Ambulatory Visit | Attending: Cardiovascular Disease | Admitting: Cardiovascular Disease

## 2018-05-26 ENCOUNTER — Encounter (HOSPITAL_COMMUNITY)
Admission: RE | Disposition: A | Discharge status: Home / Self Care | Payer: Self-pay | Source: Ambulatory Visit | Attending: Cardiovascular Disease

## 2018-05-26 DIAGNOSIS — R079 Chest pain, unspecified: Secondary | ICD-10-CM | POA: Diagnosis present

## 2018-05-26 DIAGNOSIS — F419 Anxiety disorder, unspecified: Secondary | ICD-10-CM | POA: Insufficient documentation

## 2018-05-26 DIAGNOSIS — I10 Essential (primary) hypertension: Secondary | ICD-10-CM | POA: Insufficient documentation

## 2018-05-26 DIAGNOSIS — Z9049 Acquired absence of other specified parts of digestive tract: Secondary | ICD-10-CM | POA: Diagnosis not present

## 2018-05-26 DIAGNOSIS — Z888 Allergy status to other drugs, medicaments and biological substances status: Secondary | ICD-10-CM | POA: Diagnosis not present

## 2018-05-26 DIAGNOSIS — I440 Atrioventricular block, first degree: Secondary | ICD-10-CM | POA: Diagnosis not present

## 2018-05-26 DIAGNOSIS — R0609 Other forms of dyspnea: Secondary | ICD-10-CM | POA: Diagnosis not present

## 2018-05-26 DIAGNOSIS — M7989 Other specified soft tissue disorders: Secondary | ICD-10-CM | POA: Insufficient documentation

## 2018-05-26 DIAGNOSIS — Z79899 Other long term (current) drug therapy: Secondary | ICD-10-CM | POA: Diagnosis not present

## 2018-05-26 DIAGNOSIS — R072 Precordial pain: Secondary | ICD-10-CM | POA: Diagnosis not present

## 2018-05-26 DIAGNOSIS — E785 Hyperlipidemia, unspecified: Secondary | ICD-10-CM | POA: Diagnosis not present

## 2018-05-26 DIAGNOSIS — Z9889 Other specified postprocedural states: Secondary | ICD-10-CM | POA: Insufficient documentation

## 2018-05-26 HISTORY — PX: LEFT HEART CATH AND CORONARY ANGIOGRAPHY: CATH118249

## 2018-05-26 SURGERY — LEFT HEART CATH AND CORONARY ANGIOGRAPHY
Anesthesia: LOCAL

## 2018-05-26 MED ORDER — FENTANYL CITRATE (PF) 100 MCG/2ML IJ SOLN
INTRAMUSCULAR | Status: AC
  Filled 2018-05-26: qty 2

## 2018-05-26 MED ORDER — SODIUM CHLORIDE 0.9% FLUSH: 3.0000 mL | INTRAVENOUS | Status: DC | PRN

## 2018-05-26 MED ORDER — HEPARIN SODIUM (PORCINE) 1000 UNIT/ML IJ SOLN
INTRAMUSCULAR | Status: DC | PRN
  Administered 2018-05-26: 6000 [IU] via INTRAVENOUS

## 2018-05-26 MED ORDER — SODIUM CHLORIDE 0.9 % IV SOLN: 250.0000 mL | INTRAVENOUS | Status: DC | PRN

## 2018-05-26 MED ORDER — HEPARIN (PORCINE) IN NACL 1000-0.9 UT/500ML-% IV SOLN
INTRAVENOUS | Status: AC
  Filled 2018-05-26: qty 1000

## 2018-05-26 MED ORDER — SODIUM CHLORIDE 0.9% FLUSH: 3.0000 mL | Freq: Two times a day (BID) | INTRAVENOUS | Status: DC

## 2018-05-26 MED ORDER — IOHEXOL 350 MG/ML SOLN
INTRAVENOUS | Status: DC | PRN
  Administered 2018-05-26: 80 mL via INTRA_ARTERIAL

## 2018-05-26 MED ORDER — ASPIRIN 81 MG PO CHEW: 81.0000 mg | CHEWABLE_TABLET | ORAL | Status: DC

## 2018-05-26 MED ORDER — VERAPAMIL HCL 2.5 MG/ML IV SOLN
INTRAVENOUS | Status: DC | PRN
  Administered 2018-05-26: 10 mL via INTRA_ARTERIAL

## 2018-05-26 MED ORDER — SODIUM CHLORIDE 0.9 % IV SOLN: INTRAVENOUS | Status: DC

## 2018-05-26 MED ORDER — FENTANYL CITRATE (PF) 100 MCG/2ML IJ SOLN
INTRAMUSCULAR | Status: DC | PRN
  Administered 2018-05-26 (×3): 25 ug via INTRAVENOUS

## 2018-05-26 MED ORDER — MIDAZOLAM HCL 2 MG/2ML IJ SOLN
INTRAMUSCULAR | Status: AC
  Filled 2018-05-26: qty 2

## 2018-05-26 MED ORDER — VERAPAMIL HCL 2.5 MG/ML IV SOLN
INTRAVENOUS | Status: AC
  Filled 2018-05-26: qty 2

## 2018-05-26 MED ORDER — LIDOCAINE HCL (PF) 1 % IJ SOLN
INTRAMUSCULAR | Status: DC | PRN
  Administered 2018-05-26: 2 mL

## 2018-05-26 MED ORDER — HEPARIN (PORCINE) IN NACL 1000-0.9 UT/500ML-% IV SOLN
INTRAVENOUS | Status: DC | PRN
  Administered 2018-05-26 (×2): 500 mL

## 2018-05-26 MED ORDER — ONDANSETRON HCL 4 MG/2ML IJ SOLN: 4.0000 mg | Freq: Four times a day (QID) | INTRAMUSCULAR | Status: DC | PRN

## 2018-05-26 MED ORDER — SODIUM CHLORIDE 0.9 % WEIGHT BASED INFUSION: 1.0000 mL/kg/h | INTRAVENOUS | Status: DC

## 2018-05-26 MED ORDER — SODIUM CHLORIDE 0.9 % WEIGHT BASED INFUSION
3.0000 mL/kg/h | INTRAVENOUS | Status: DC
  Administered 2018-05-26: 3 mL/kg/h via INTRAVENOUS

## 2018-05-26 MED ORDER — MIDAZOLAM HCL 2 MG/2ML IJ SOLN
INTRAMUSCULAR | Status: DC | PRN
  Administered 2018-05-26: 1 mg via INTRAVENOUS
  Administered 2018-05-26: 2 mg via INTRAVENOUS
  Administered 2018-05-26: 1 mg via INTRAVENOUS

## 2018-05-26 MED ORDER — DIAZEPAM 5 MG PO TABS: 5.0000 mg | ORAL_TABLET | ORAL | Status: DC | PRN

## 2018-05-26 MED ORDER — ACETAMINOPHEN 325 MG PO TABS: 650.0000 mg | ORAL_TABLET | ORAL | Status: DC | PRN

## 2018-05-26 MED ORDER — LIDOCAINE HCL (PF) 1 % IJ SOLN
INTRAMUSCULAR | Status: AC
  Filled 2018-05-26: qty 30

## 2018-05-26 SURGICAL SUPPLY — 11 items
CATH INFINITI 5FR ANG PIGTAIL (CATHETERS) ×1 IMPLANT
CATH OPTITORQUE TIG 4.0 5F (CATHETERS) ×1 IMPLANT
DEVICE RAD COMP TR BAND LRG (VASCULAR PRODUCTS) ×1 IMPLANT
GLIDESHEATH SLEND SS 6F .021 (SHEATH) ×1 IMPLANT
GUIDEWIRE INQWIRE 1.5J.035X260 (WIRE) IMPLANT
INQWIRE 1.5J .035X260CM (WIRE) ×2
KIT HEART LEFT (KITS) ×2 IMPLANT
PACK CARDIAC CATHETERIZATION (CUSTOM PROCEDURE TRAY) ×2 IMPLANT
SYR MEDRAD MARK V 150ML (SYRINGE) ×2 IMPLANT
TRANSDUCER W/STOPCOCK (MISCELLANEOUS) ×2 IMPLANT
TUBING CIL FLEX 10 FLL-RA (TUBING) ×2 IMPLANT

## 2018-05-26 NOTE — Interval H&P Note (Signed)
Cath Lab Visit (complete for each Cath Lab visit)  Clinical Evaluation Leading to the Procedure:   ACS: No.  Non-ACS:    Anginal Classification: CCS III  Anti-ischemic medical therapy: Minimal Therapy (1 class of medications)  Non-Invasive Test Results: No non-invasive testing performed  Prior CABG: No previous CABG      History and Physical Interval Note:  05/26/2018 8:18 AM  [place] A Farra  has presented today for surgery, with the diagnosis of cp  The various methods of treatment have been discussed with the patient and family. After consideration of risks, benefits and other options for treatment, the patient has consented to  Procedure(s): LEFT HEART CATH AND CORONARY ANGIOGRAPHY (N/A) as a surgical intervention .  The patient's history has been reviewed, patient examined, no change in status, stable for surgery.  I have reviewed the patient's chart and labs.  Questions were answered to the patient's satisfaction.     Nicki Guadalajara

## 2018-05-26 NOTE — Discharge Instructions (Signed)
Drink plenty of fluids over next 48 hours and keep right wrist elevated at heart level for 24 hours ° °Radial Site Care °Refer to this sheet in the next few weeks. These instructions provide you with information about caring for yourself after your procedure. Your health care provider may also give you more specific instructions. Your treatment has been planned according to current medical practices, but problems sometimes occur. Call your health care provider if you have any problems or questions after your procedure. °What can I expect after the procedure? °After your procedure, it is typical to have the following: °· Bruising at the radial site that usually fades within 1-2 weeks. °· Blood collecting in the tissue (hematoma) that may be painful to the touch. It should usually decrease in size and tenderness within 1-2 weeks. ° °Follow these instructions at home: °· Take medicines only as directed by your health care provider. °· You may shower 24-48 hours after the procedure or as directed by your health care provider. Remove the bandage (dressing) and gently wash the site with plain soap and water. Pat the area dry with a clean towel. Do not rub the site, because this may cause bleeding. °· Do not take baths, swim, or use a hot tub until your health care provider approves. °· Check your insertion site every day for redness, swelling, or drainage. °· Do not apply powder or lotion to the site. °· Do not flex or bend the affected arm for 24 hours or as directed by your health care provider. °· Do not push or pull heavy objects with the affected arm for 24 hours or as directed by your health care provider. °· Do not lift over 10 lb (4.5 kg) for 5 days after your procedure or as directed by your health care provider. °· Ask your health care provider when it is okay to: °? Return to work or school. °? Resume usual physical activities or sports. °? Resume sexual activity. °· Do not drive home if you are discharged the  same day as the procedure. Have someone else drive you. °· You may drive 24 hours after the procedure unless otherwise instructed by your health care provider. °· Do not operate machinery or power tools for 24 hours after the procedure. °· If your procedure was done as an outpatient procedure, which means that you went home the same day as your procedure, a responsible adult should be with you for the first 24 hours after you arrive home. °· Keep all follow-up visits as directed by your health care provider. This is important. °Contact a health care provider if: °· You have a fever. °· You have chills. °· You have increased bleeding from the radial site. Hold pressure on the site. °Get help right away if: °· You have unusual pain at the radial site. °· You have redness, warmth, or swelling at the radial site. °· You have drainage (other than a small amount of blood on the dressing) from the radial site. °· The radial site is bleeding, and the bleeding does not stop after 30 minutes of holding steady pressure on the site. °· Your arm or hand becomes pale, cool, tingly, or numb. °This information is not intended to replace advice given to you by your health care provider. Make sure you discuss any questions you have with your health care provider. °Document Released: 08/04/2010 Document Revised: 12/08/2015 Document Reviewed: 01/18/2014 °Elsevier Interactive Patient Education © 2018 Elsevier Inc. ° °

## 2018-05-26 NOTE — Interval H&P Note (Signed)
History and Physical Interval Note:  05/26/2018 8:16 AM  Joshua Simpson  has presented today for surgery, with the diagnosis of cp  The various methods of treatment have been discussed with the patient and family. After consideration of risks, benefits and other options for treatment, the patient has consented to  Procedure(s): LEFT HEART CATH AND CORONARY ANGIOGRAPHY (N/A) as a surgical intervention .  The patient's history has been reviewed, patient examined, no change in status, stable for surgery.  I have reviewed the patient's chart and labs.  Questions were answered to the patient's satisfaction.     Nicki Guadalajara

## 2018-05-29 ENCOUNTER — Telehealth: Payer: Self-pay

## 2018-05-29 NOTE — Telephone Encounter (Signed)
Spoke with a patient and follow up appt made with Dr Tresa EndoKelly 06/17/2018 at 8:20 am.Patient agreeable with appt time.

## 2018-05-29 NOTE — Telephone Encounter (Signed)
-----   Message from Jacqlyn KraussAnne M Lankford, RN sent at 05/29/2018  2:48 PM EST ----- Regarding: no follow-up after cath done 05/26/18 Wynema BirchHao,  He had cath done but does not have any follow-up scheduled. Does he need a follow-up appointment? If so, how soon?  Thanks, Thurston HoleAnne

## 2018-05-29 NOTE — Progress Notes (Signed)
Kidney function and electrolyte ok. Red blood cell count stable.

## 2018-06-17 ENCOUNTER — Encounter: Payer: Self-pay | Admitting: Cardiovascular Disease

## 2018-06-17 ENCOUNTER — Ambulatory Visit (INDEPENDENT_AMBULATORY_CARE_PROVIDER_SITE_OTHER): Payer: 59 | Admitting: Cardiovascular Disease

## 2018-06-17 ENCOUNTER — Telehealth: Payer: Self-pay | Admitting: *Deleted

## 2018-06-17 VITALS — BP 128/90 | HR 76 | Ht 72.0 in | Wt 282.0 lb

## 2018-06-17 DIAGNOSIS — R0683 Snoring: Secondary | ICD-10-CM

## 2018-06-17 DIAGNOSIS — R002 Palpitations: Secondary | ICD-10-CM

## 2018-06-17 DIAGNOSIS — R400 Somnolence: Secondary | ICD-10-CM

## 2018-06-17 DIAGNOSIS — I5189 Other ill-defined heart diseases: Secondary | ICD-10-CM

## 2018-06-17 DIAGNOSIS — E785 Hyperlipidemia, unspecified: Secondary | ICD-10-CM

## 2018-06-17 DIAGNOSIS — I10 Essential (primary) hypertension: Secondary | ICD-10-CM

## 2018-06-17 DIAGNOSIS — Z6838 Body mass index (BMI) 38.0-38.9, adult: Secondary | ICD-10-CM

## 2018-06-17 DIAGNOSIS — G478 Other sleep disorders: Secondary | ICD-10-CM

## 2018-06-17 DIAGNOSIS — I519 Heart disease, unspecified: Secondary | ICD-10-CM

## 2018-06-17 DIAGNOSIS — R0789 Other chest pain: Secondary | ICD-10-CM

## 2018-06-17 DIAGNOSIS — E6609 Other obesity due to excess calories: Secondary | ICD-10-CM

## 2018-06-17 MED ORDER — METOPROLOL SUCCINATE ER 50 MG PO TB24: 50.0000 mg | ORAL_TABLET | Freq: Every day | ORAL | 3 refills | Status: DC

## 2018-06-17 NOTE — Telephone Encounter (Signed)
PA for sleep study submitted to [employer] via web portal. Clinicals faxed to [phone]. 

## 2018-06-17 NOTE — Patient Instructions (Addendum)
Medication Instructions:  STOP atenolol START metoprolol succinate (Toprol XL) 50 mg daily  If you need a refill on your cardiac medications before your next appointment, please call your pharmacy.   Labs: Please return for FASTING labs (CMET, Lipid)  Our in office lab hours are Monday-Friday 8:00-4:00, closed for lunch 12:45-1:45 pm.  No appointment needed.  Testing/Procedures: Your physician has requested that you have an echocardiogram. Echocardiography is a painless test that uses sound waves to create images of your heart. It provides your doctor with information about the size and shape of your heart and how well your heart's chambers and valves are working. This procedure takes approximately one hour. There are no restrictions for this procedure. [facility]:  [address]  Your physician has recommended that you have a sleep study. This test records several body functions during sleep, including: brain activity, eye movement, oxygen and carbon dioxide blood levels, heart rate and rhythm, breathing rate and rhythm, the flow of air through your mouth and nose, snoring, body muscle movements, and chest and belly movement.  Follow-Up: At [facility], you and your health needs are our priority.  As part of our continuing mission to provide you with exceptional heart care, we have created designated Provider Care Teams.  These Care Teams include your primary Cardiologist (physician) and Advanced Practice Providers (APPs -  Physician Assistants and Nurse Practitioners) who all work together to provide you with the care you need, when you need it. You will need a follow up appointment in 3-4 months.  Please call our office 2 months in advance to schedule this appointment.  You may see Nicki Guadalajarahomas Kelly, MD or one of the following Advanced Practice Providers on your designated Care Team: BankstonHao Meng, [place] JerseyPA-C . Micah FlesherAngela Duke, PA-C

## 2018-06-17 NOTE — Progress Notes (Signed)
Patient ID: Joshua Simpson, male   DOB: [birth year], 47 y.o.   MRN: [id number]     Primary: Heide Scales, NP  PATIENT PROFILE: Joshua Simpson is a 47 y.o. male who presents for a 6 month follow-up cardiology evaluation.  HPI:  Joshua Simpson has a history of hypertension and in 2011 experience atypical chest pain.  He was evaluated by Dr. Sallyanne Kuster initially in  2011.  His chest pain was felt to be atypical and not exertionally related and most likely musculoskeletal.  It was felt that he had anxiety driven, sinus tachycardia and significant cardiac deconditioning.  His last cardiac evaluation was 5 years ago by Dr. Ellyn Hack.  The patient has issues with anxiety.  Over the last 3 weeks.  He is experiencing increased episodes of shortness of breath.  He denies chest pain.  He does have hypertension.  He has been awakened from sleep, particularly around 5 AM with his heart pounding.  He admits to very poor sleep.  His sleep is nonrestorative.  He does note some daytime sleepiness.  He snores and at times has awakened gasping for breath.  He has taken Xanax in the past for anxiety.  He has GERD for which he has taken pantoprazole.  He had recently been out of work after surgery which her surgery for [employer].  Because of his increasing symptoms associated with increased blood pressure, he was referred for cardiology evaluation.  When I saw him initially he had significant hypertension and his blood pressure was 150/110.  He had been on low-dose atenolol and I increased this to 25 mg in the morning and 12.5 mg in the evening.  I also started him on lisinopril at 5 mg daily.  He underwent an echo Doppler study which was done on 09/21/2015.  This showed an ejection fraction of 60-65% without wall motion abnormalities.  There was grade 2 diastolic dysfunction.  His aortic root was upper normal at 37 mm.  There was trivial MR.  When I saw him in April 2017 his blood pressure had stabilized and typically  runs in the 301 systolic range and in the 60F in the diastolic range at home.  He denied chest pain.  He does have anxiety issues and has taking alprazolam intermittently.  He has history of GERD for which he has been on pantoprazole.  I was concerned with sleep apnea but due to his lack of insurance he wished to defer evaluation.  He developed intermittent episodes of chest pain with atypical features which led to an emergency room evaluation February 2018.  Troponins were negative.  He did not have acute ECG changes, but he did have nonspecific lateral T-wave abnormality.  Chest x-ray did not reveal any significant abnormality.    When I saw him in May 2018 he had undergone a routine treadmill test which was negative for ischemia. He had been doing fairly well until February 2019 and he developed palpitations as well as episodes of chest discomfort episodes of awakening at night with his heart pounding and not sleeping well.  In the past I had recommended a sleep evaluation but he did decided against this.  He was seen by Jory Sims, NP for a 14-day monitor.  This revealed predominant sinus rhythm with an average rate at 60 bpm.  There were periods of sinus bradycardia, isolated PACs, and rare episodes of atrial couplets.  There was transient first-degree AV block.  There was no ventricular ectopy or episodes of  atrial fibrillation or significant pauses.  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  He had undergone cholecystectomy in January 2019.  Recently, he admits to shortness of breath with activity and admits to perspiring significantly.  He notes that sharp atypical chest pain which is nonexertional and short-lived.    Since I last saw him in the office, he was seen by Almyra Deforest on May 15, 2018 and at that time was having worsening chest pain both at rest and with exertion over 2 months previously.  He was referred for definitive cardiac  catheterization which I performed on May 26, 2018.  He was found to have low normal LV function with EF estimated 50%.  LVEDP was increased to 22 mm.  He did not have focal segmental wall motion abnormalities.  He had normal epicardial coronary arteries.  There is a she did not identify any coronary obstructive disease.  If he continues to experience exertional chest pressure cardiopulmonary met testing to assess for possible microvascular angina was recommended.  Also an outpatient echo Doppler study was recommended to reassess LV systolic and diastolic function.  Over the past month, he denies any recurrent chest pain.  This past Sunday, he had had 2 diet Mountain Dew's.  At that evening he had noticed his heart racing and was unable to sleep.  Retrospectively, he admits that he has experienced episodes of nocturnal palpitations.  He admits to snoring.  His sleep is nonrestorative.  He admits to daytime fatigue.  At times he also has noticed some mild ankle swelling particularly since with his new job he must wear boots to work.  Sent for evaluation.  Past Medical History:  Diagnosis Date  . Anxiety   . Hypertension     Past Surgical History:  Procedure Laterality Date  . CHOLECYSTECTOMY N/A 08/14/2017   Procedure: LAPAROSCOPIC CHOLECYSTECTOMY WITH INTRAOPERATIVE CHOLANGIOGRAM;  Surgeon: Donnie Mesa, MD;  Location: [facility];  Service: General;  Laterality: N/A;  . LEFT HEART CATH AND CORONARY ANGIOGRAPHY N/A 05/26/2018   Procedure: LEFT HEART CATH AND CORONARY ANGIOGRAPHY;  Surgeon: Troy Sine, MD;  Location: [facility];  Service: Cardiovascular;  Laterality: N/A;  . SHOULDER SURGERY      Allergies  Allergen Reactions  . Trolamine Salicylate Rash    Current Outpatient Medications  Medication Sig Dispense Refill  . ALPRAZolam (XANAX) 0.5 MG tablet Take 0.5 mg by mouth 2 (two) times daily.    . cetirizine (ZYRTEC) 10 MG tablet Take 10 mg by mouth daily.    Marland Kitchen lisinopril  (PRINIVIL,ZESTRIL) 10 MG tablet Take 0.5 tablets (5 mg total) by mouth daily. Takes one-half tablet 30 tablet 6  . nitroGLYCERIN (NITROSTAT) 0.4 MG SL tablet Place 1 tablet (0.4 mg total) under the tongue every 5 (five) minutes as needed for chest pain. 25 tablet 4  . ondansetron (ZOFRAN) 4 MG tablet Take 1 tablet (4 mg total) by mouth every 8 (eight) hours as needed for nausea or vomiting. 11 tablet 0  . pantoprazole (PROTONIX) 40 MG tablet Take 1 tablet (40 mg total) by mouth daily as needed (GERD). 30 tablet 6  . pravastatin (PRAVACHOL) 80 MG tablet Take 1 tablet (80 mg total) by mouth every evening. 90 tablet 1  . metoprolol succinate (TOPROL-XL) 50 MG 24 hr tablet Take 1 tablet (50 mg total) by mouth daily. Take with or immediately following a meal. 90 tablet 3   No  current facility-administered medications for this visit.     Social History   Socioeconomic History  . Marital status: Divorced    Spouse name: Not on file  . Number of children: Not on file  . Years of education: Not on file  . Highest education level: Not on file  Occupational History  . Not on file  Social Needs  . Financial resource strain: Not on file  . Food insecurity:    Worry: Not on file    Inability: Not on file  . Transportation needs:    Medical: Not on file    Non-medical: Not on file  Tobacco Use  . Smoking status: Never Smoker  . Smokeless tobacco: Never Used  Substance and Sexual Activity  . Alcohol use: No    Alcohol/week: 0.0 standard drinks    Comment: occassionally  . Drug use: No  . Sexual activity: Not on file  Lifestyle  . Physical activity:    Days per week: Not on file    Minutes per session: Not on file  . Stress: Not on file  Relationships  . Social connections:    Talks on phone: Not on file    Gets together: Not on file    Attends religious service: Not on file    Active member of club or organization: Not on file    Attends meetings of clubs or organizations: Not on file     Relationship status: Not on file  . Intimate partner violence:    Fear of current or ex partner: Not on file    Emotionally abused: Not on file    Physically abused: Not on file    Forced sexual activity: Not on file  Other Topics Concern  . Not on file  Social History Narrative  . Not on file   Social history is notable that he is married for 18 years.  He has one child.  He works at the [employer].  He completed 11th grade of education.  There is no history of tobacco use.  He does not routinely exercise but does walk occasionally.  Family History  Problem Relation Age of Onset  . Lung cancer Father    Family history is notable that both parents are living at age 79.  Mother has issues with anxiety.  Father had cancer of his kidney.  He is one brother age 29 and 1 sister, age 41.  His child is age 58.  ROS General: Negative; No fevers, chills, or night sweats HEENT: Negative; No changes in vision or hearing, sinus congestion, difficulty swallowing Pulmonary: Negative; No cough, wheezing, shortness of breath, hemoptysis Cardiovascular:  See HPI;  GI: Negative; No nausea, vomiting, diarrhea, or abdominal pain GU: Negative; No dysuria, hematuria, or difficulty voiding Musculoskeletal:Status post right rotator cuff surgery. Hematologic/Oncologic: Negative; no easy bruising, bleeding Endocrine: Negative; no heat/cold intolerance; no diabetes Neuro: Negative; no changes in balance, headaches Skin: Negative; No rashes or skin lesions Psychiatric: Positive for anxiety Sleep: Intermittent poor sleep; nohypersomnolence, bruxism, restless legs, hypnogagnic hallucinations Other comprehensive 14 point system review is negative   Physical Exam BP 128/90   Pulse 76   Ht 6' (1.829 m)   Wt 282 lb (127.9 kg)   BMI 38.25 kg/m    Repeat blood pressure by me was 128/88  Wt Readings from Last 3 Encounters:  06/17/18 282 lb (127.9 kg)  05/26/18 265 lb (120.2 kg)  05/15/18  270 lb (122.5 kg)   General: Alert, oriented,  no distress. Mesomorphic Skin: normal turgor, no rashes, warm and dry HEENT: Normocephalic, atraumatic. Pupils equal round and reactive to light; sclera anicteric; extraocular muscles intact;  Nose without nasal septal hypertrophy Mouth/Parynx benign; Mallinpatti scale 3 Neck: Thick neck; No JVD, no carotid bruits; normal carotid upstroke Lungs: clear to ausculatation and percussion; no wheezing or rales Chest wall: without tenderness to palpitation Heart: PMI not displaced, RRR, s1 s2 normal, 1/6 systolic murmur, no diastolic murmur, no rubs, gallops, thrills, or heaves Abdomen: soft, nontender; no hepatosplenomehaly, BS+; abdominal aorta nontender and not dilated by palpation. Back: no CVA tenderness Pulses 2+ Musculoskeletal: full range of motion, normal strength, no joint deformities Extremities: no clubbing cyanosis or edema, Homan's sign negative  Neurologic: grossly nonfocal; Cranial nerves grossly wnl Psychologic: Normal mood and affect   ECG (independently read by me): Normal sinus rhythm at 75 bpm, occasional QTc interval 464 ms.  PR interval 196 ms.  [facility]  June 2019 ECG (independently read by me): Sinus rhythm at 67 bpm.  Normal intervals.  No ectopy.  No ST segment changes.  May 2018 ECG (independently read by me): Normal sinus rhythm at 66 bpm.  QTc interval 425 ms, PR interval 192 ms.  No significant ST-T changes.  Mild nondiagnostic T change in lead 3.  April 2017 ECG (independently read by me): Normal sinus rhythm at 63 bpm.  Normal intervals.  January 2017 ECG (independently read by me): Normal sinus rhythm at 67 bpm.  QTc interval 452 ms.  No significant ST-T changes  LABS:  BMP Latest Ref Rng & Units 05/22/2018 01/09/2018 10/21/2017  Glucose 65 - 99 mg/dL 107(H) 100(H) 160(H)  BUN 6 - 24 mg/dL '10 11 10  '$ Creatinine 0.76 - 1.27 mg/dL 1.10 1.15 1.23  BUN/Creat Ratio 9 - '20 9 10 '$ -  Sodium 134 - 144 mmol/L 140 141 137    Potassium 3.5 - 5.2 mmol/L 4.3 4.9 3.5  Chloride 96 - 106 mmol/L 101 105 104  CO2 20 - 29 mmol/L '24 22 23  '$ Calcium 8.7 - 10.2 mg/dL 9.5 9.6 9.1    Hepatic Function Latest Ref Rng & Units 01/09/2018 10/21/2017 08/15/2017  Total Protein 6.0 - 8.5 g/dL 7.2 7.3 6.0(L)  Albumin 3.5 - 5.5 g/dL 4.9 4.5 3.6  AST 0 - 40 IU/L 32 25 44(H)  ALT 0 - 44 IU/L 44 31 41  Alk Phosphatase 39 - 117 IU/L 81 85 53  Total Bilirubin 0.0 - 1.2 mg/dL 1.1 1.8(H) 1.4(H)    CBC Latest Ref Rng & Units 05/22/2018 01/09/2018 10/21/2017  WBC 3.4 - 10.8 x10E3/uL 7.1 5.1 15.4(H)  Hemoglobin 13.0 - 17.7 g/dL 16.2 16.6 15.5  Hematocrit 37.5 - 51.0 % 46.1 46.8 45.5  Platelets 150 - 450 x10E3/uL 225 202 192   Lab Results  Component Value Date   MCV 91 05/22/2018   MCV 93 01/09/2018   MCV 93.0 10/21/2017   Lab Results  Component Value Date   TSH 1.110 01/09/2018   No results found for: HGBA1C   BNP No results found for: BNP  ProBNP No results found for: PROBNP   Lipid Panel     Component Value Date/Time   CHOL 216 (H) 01/09/2018 0951   TRIG 91 01/09/2018 0951   HDL 55 01/09/2018 0951   CHOLHDL 3.9 01/09/2018 0951   LDLCALC 143 (H) 01/09/2018 0951    RADIOLOGY: No results found.  IMPRESSION:  1. Essential hypertension   2. Snoring   3. Non-restorative sleep  4. Daytime sleepiness   5. Palpitations   6. Hyperlipidemia, unspecified hyperlipidemia type   7. Class 2 obesity due to excess calories with body mass index (BMI) of 38.0 to 38.9 in adult, unspecified whether serious comorbidity present   8. Atypical chest pain   9. Grade II diastolic dysfunction      ASSESSMENT AND PLAN: [name] is a 47 year old gentleman who has a history of obesity, as well as hypertension and anxiety.  He had noticed some episodes of increasing shortness of breath and palpitations.  He also has been awakened from sleep with his heart pounding and notes significant snoring as well as awakening gasping for  breath.  When I saw him initially, he had significant hypertension.  A routine treadmill test was negative for ischemia but had demonstrated  systolic hypertension with stress.  A remote echo Doppler study in 2017 had shown normal systolic function with grade 2 diastolic dysfunction.  Remotely, we had discussed sleep evaluation but he never pursued this in the past before he was working due to financial concerns.  He had recently developed increasing chest pain symptomatology.  At cardiac catheterization he was found to have normal epicardial coronary arteries.  LVEDP was increased to 22 mm.  LV function was low normal at 50%.  He again has developed some episodes of nocturnal palpitations.  This past episode most likely was precipitated by caffeine from [facility].  His blood pressure today is stable.  I have recommended he discontinue atenolol which he has been taking 25 mg in morning and 12.5 milligrams in the evening and will change him to metoprolol succinate 50 mg daily.  He will continue with lisinopril 5 mg.  He had been started on rosuvastatin in June when his LDL cholesterol was 143.  However he developed some side effects to this and when he saw Almyra Deforest, PA-C he was changed to pravastatin 80 mg.  Is not had follow-up laboratory on the pravastatin dose.  Recent thyroid function studies have been normal.  I have recommended he undergo the sleep study which was recommended in the past.  It appears likely that he has obstructive sleep apnea which may be contributing to nocturnal hypoxemia leading to nocturnal palpitations.  State.  I will see him in 4 months for reevaluation.  Time spent: 25 minutes Troy Sine, MD, [facility] 06/17/2018 9:14 AM

## 2018-06-23 ENCOUNTER — Telehealth: Payer: Self-pay | Admitting: *Deleted

## 2018-06-23 NOTE — Telephone Encounter (Signed)
Left message to return a call for sleep study appointment details. 

## 2018-06-26 NOTE — Telephone Encounter (Signed)
Rockney Gheeeborah Childress informed of Sleep study appointment. She states that patient may request to put it off for now due to $$. She will check with him and have him to call the sleep lab directly to cancel or reschedule. Sleep labs contact information provided to Ms. Theda Belfasthildress

## 2018-07-07 ENCOUNTER — Other Ambulatory Visit (HOSPITAL_COMMUNITY): Payer: 59

## 2018-07-10 ENCOUNTER — Encounter: Payer: Self-pay | Admitting: Cardiovascular Disease

## 2018-07-22 ENCOUNTER — Telehealth: Payer: Self-pay

## 2018-07-22 ENCOUNTER — Encounter (HOSPITAL_BASED_OUTPATIENT_CLINIC_OR_DEPARTMENT_OTHER): Payer: 59

## 2018-07-22 NOTE — Telephone Encounter (Signed)
Noted. Thanks.

## 2018-07-22 NOTE — Telephone Encounter (Signed)
New message    Just an FYI. We have made several attempts to contact this patient including sending a letter to schedule or reschedule their echocardiogram. We will be removing the patient from the echo WQ.   Thank you 

## 2018-07-22 NOTE — Telephone Encounter (Signed)
FORWARD TO  DR Tresa EndoKELLY  AND NURSE FOR THERE INFORMATION

## 2018-09-22 ENCOUNTER — Ambulatory Visit: Payer: 59 | Admitting: Cardiovascular Disease

## 2018-10-15 ENCOUNTER — Other Ambulatory Visit: Payer: Self-pay | Admitting: Adult Health

## 2019-05-27 IMAGING — RF DG CHOLANGIOGRAM OPERATIVE
1 series · 4 of 4 positions shown · non-contrast
Comparison: Ultrasound 08/03/2016

CLINICAL DATA: 46-year-old male with a history of cholelithiasis

EXAM:
INTRAOPERATIVE CHOLANGIOGRAM
TECHNIQUE: Cholangiographic images from the C-arm fluoroscopic device were
submitted for interpretation post-operatively. Please see the
procedural report for the amount of contrast and the fluoroscopy
time utilized.

[Series 1: run · 4 of 38 frames shown]
[frame 6/38]
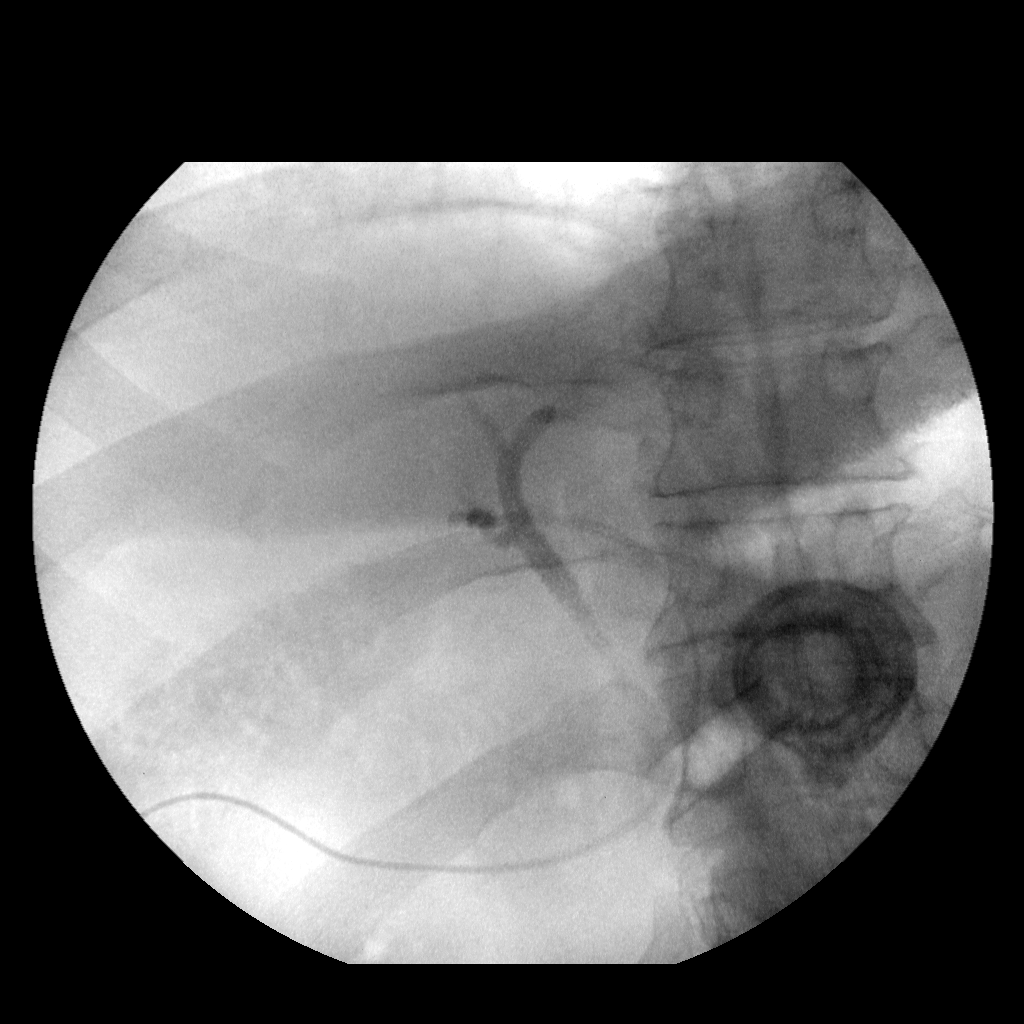
[frame 12/38]
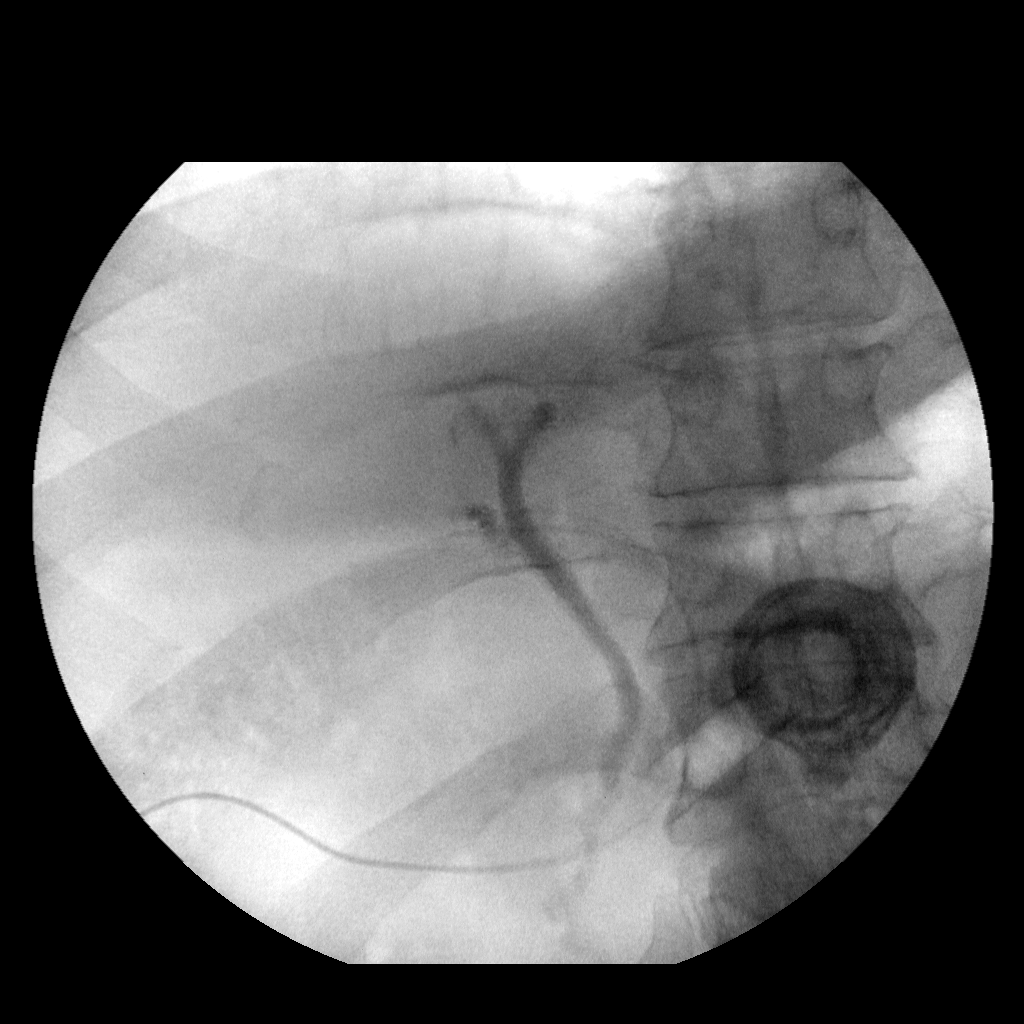
[frame 20/38]
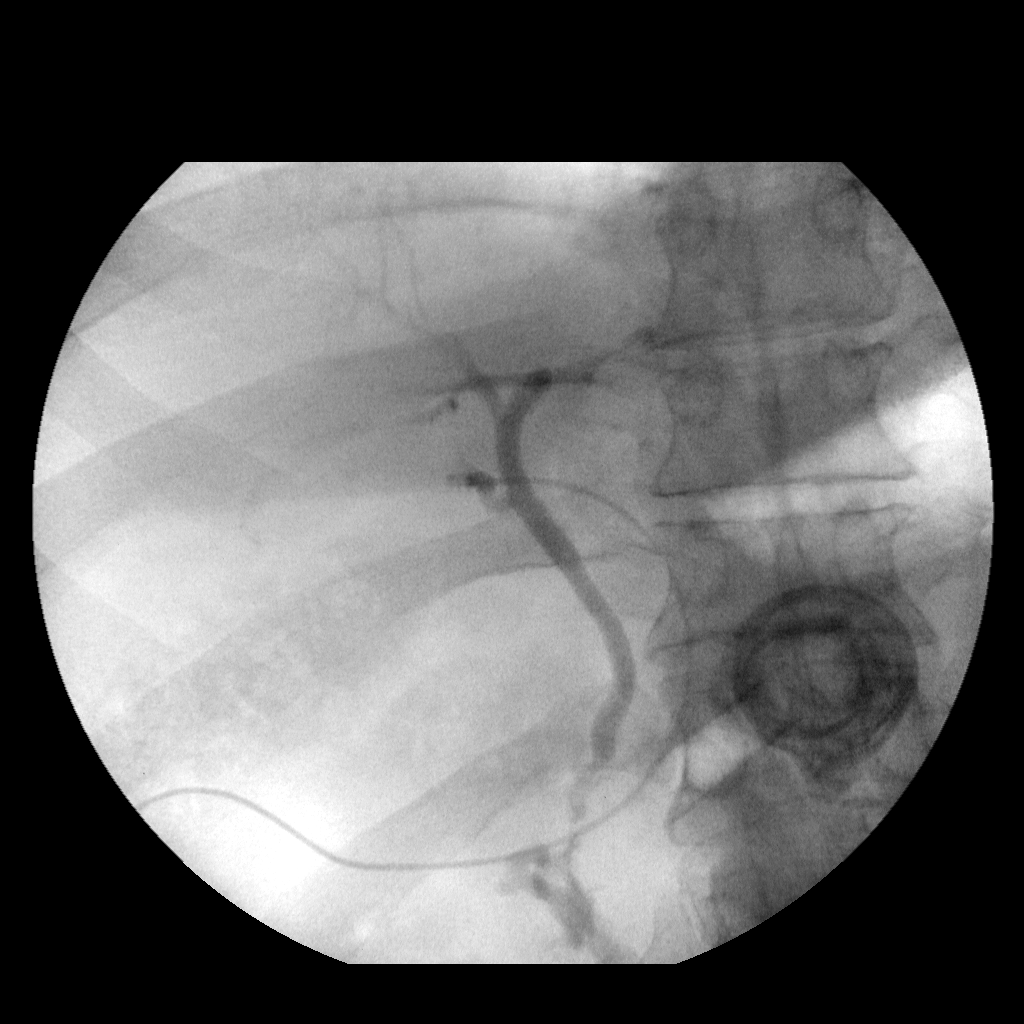
[frame 33/38]
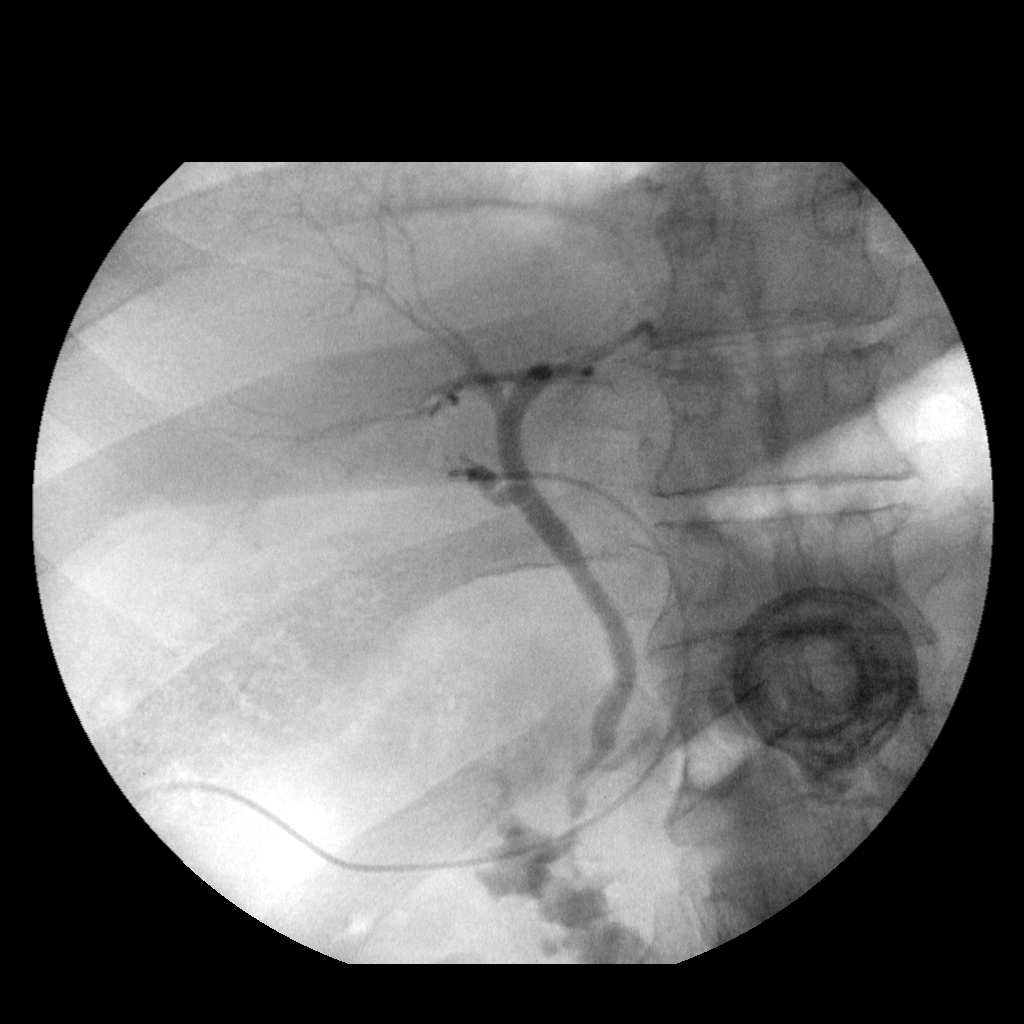

[4 of 4 positions shown; findings below may reference images not displayed]

FINDINGS: Surgical instruments project over the upper abdomen.

There is cannulation of the cystic duct/gallbladder neck, with
antegrade infusion of contrast. Caliber of the extrahepatic ductal
system within normal limits.

No definite filling defect within the extrahepatic ducts identified.

Free flow of contrast across the ampulla.
IMPRESSION: Intraoperative cholangiogram demonstrates extrahepatic biliary ducts
of unremarkable caliber, with no definite filling defects
identified. Free flow of contrast across the ampulla.

Please refer to the dictated operative report for full details of
intraoperative findings and procedure

## 2019-06-10 ENCOUNTER — Other Ambulatory Visit: Payer: Self-pay | Admitting: Cardiovascular Disease

## 2019-08-03 ENCOUNTER — Other Ambulatory Visit: Payer: Self-pay | Admitting: Cardiovascular Disease

## 2019-10-27 ENCOUNTER — Other Ambulatory Visit: Payer: Self-pay | Admitting: Adult Health

## 2019-11-23 ENCOUNTER — Other Ambulatory Visit: Payer: Self-pay | Admitting: Adult Health

## 2019-11-25 ENCOUNTER — Other Ambulatory Visit: Payer: Self-pay | Admitting: Adult Health

## 2019-12-07 ENCOUNTER — Other Ambulatory Visit: Payer: Self-pay | Admitting: Cardiovascular Disease

## 2019-12-07 NOTE — Telephone Encounter (Signed)
*  STAT* If patient is at the pharmacy, call can be transferred to refill team.   1. Which medications need to be refilled? (please list name of each medication and dose if known)   metoprolol succinate (TOPROL-XL) 50 MG 24 hr tablet  lisinopril (ZESTRIL) 10 MG tablet    2. Which pharmacy/location (including street and city if local pharmacy) is medication to be sent to?   3. Do they need a 30 day or 90 day supply? 90 day supply   Patient states he has 6 tablets remaining. Patient scheduled MyChartVideoVisit for 12/08/19 at 9:45 AM with Micah Flesher. He states he is unable to get off work for an OV due to being a [employer].

## 2019-12-07 NOTE — Progress Notes (Signed)
Virtual Visit via Telephone Note   This visit type was conducted due to national recommendations for restrictions regarding the COVID-19 Pandemic (e.g. social distancing) in an effort to limit this patient's exposure and mitigate transmission in our community.  Due to his co-morbid illnesses, this patient is at least at moderate risk for complications without adequate follow up.  This format is felt to be most appropriate for this patient at this time.  The patient did not have access to video technology/had technical difficulties with video requiring transitioning to audio format only (telephone).  All issues noted in this document were discussed and addressed.  No physical exam could be performed with this format.  Please refer to the patient's chart for his  consent to telehealth for [facility].   The patient was identified using 2 identifiers.  Date:  12/08/2019   ID:  Joshua Simpson, DOB 1970-09-06, MRN [id number]  Patient Location: Other:  work Provider Location: Office  PCP:  Welford Roche, NP  Cardiologist:  Shelva Majestic, MD  Electrophysiologist:  None   Evaluation Performed:  Follow-Up Visit  Chief Complaint:  hypertension  History of Present Illness:    Joshua Simpson is a 49 y.o. male with HTN, anxiety, palpitations worse with caffeine and anxiety, obesity, and suspected sleep apnea. He has been followed for atypical chest pain and hypertension. POET 2018 nonischemic. Echo 2017 revealed normal EF, diastolic dysfunction, no WMA, and no significant valvular disease.  Heart monitor 09/11/17 revealed sinus rhythm with PACs and transient first degree heart block, no Afib or pauses noted. Due to continued chest pain, angiography was obtained. Left heart cath revealed normal coronaries. He is maintained on metoprolol succinate 50 mg daily and 5 mg lisinopril. He was started on crestor for elevated LDL, but had side effects and was switched to pravachol. Has not had a repeat lipid since  then. He was last seen by Dr. Claiborne Billings in Dec 2019 and was doing well at that time. Dr. Claiborne Billings recommended sleep study for suspected sleep apnea, but the patient has not completed this.   He presents today for follow up. BP is well controlled on present regimen. He controls palpitations with xanax - takes xanax daily. We discussed increasing toprol if needed. He doesn't want to make med changes at this time. He tried pravachol and had muscle aches and stopped this - need updated lipid profile and likely at last zetia.  No CP or dyspnea, lower extremity swelling, or orthopnea.    The patient does not have symptoms concerning for COVID-19 infection (fever, chills, cough, or new shortness of breath).    Past Medical History:  Diagnosis Date  . Anxiety   . Hypertension    Past Surgical History:  Procedure Laterality Date  . CHOLECYSTECTOMY N/A 08/14/2017   Procedure: LAPAROSCOPIC CHOLECYSTECTOMY WITH INTRAOPERATIVE CHOLANGIOGRAM;  Surgeon: Donnie Mesa, MD;  Location: [place];  Service: General;  Laterality: N/A;  . LEFT HEART CATH AND CORONARY ANGIOGRAPHY N/A 05/26/2018   Procedure: LEFT HEART CATH AND CORONARY ANGIOGRAPHY;  Surgeon: Troy Sine, MD;  Location: [place] [facility];  Service: Cardiovascular;  Laterality: N/A;  . SHOULDER SURGERY       Current Meds  Medication Sig  . ALPRAZolam (XANAX) 0.5 MG tablet Take 0.5 mg by mouth 2 (two) times daily.  . cetirizine (ZYRTEC) 10 MG tablet Take 10 mg by mouth daily.  Marland Kitchen lisinopril (ZESTRIL) 10 MG tablet Take 0.5 tablets (5 mg total) by mouth daily. NEED  OV.  . metoprolol succinate (TOPROL-XL) 50 MG 24 hr tablet TAKE 1 TABLET BY MOUTH DAILY WITH OR IMMEDIATELY FOLLOWING A MEAL  . nitroGLYCERIN (NITROSTAT) 0.4 MG SL tablet Place 1 tablet (0.4 mg total) under the tongue every 5 (five) minutes as needed for chest pain.  Marland Kitchen ondansetron (ZOFRAN) 4 MG tablet Take 1 tablet (4 mg total) by mouth every 8 (eight) hours as needed for nausea or vomiting.    . pantoprazole (PROTONIX) 40 MG tablet Take 1 tablet (40 mg total) by mouth daily as needed (GERD).  . [DISCONTINUED] lisinopril (ZESTRIL) 10 MG tablet Take 0.5 tablets (5 mg total) by mouth daily. NEED OV.  . [DISCONTINUED] metoprolol succinate (TOPROL-XL) 50 MG 24 hr tablet TAKE 1 TABLET BY MOUTH DAILY WITH OR IMMEDIATELY FOLLOWING A MEAL     Allergies:   Trolamine salicylate   Social History   Tobacco Use  . Smoking status: Never Smoker  . Smokeless tobacco: Never Used  Substance Use Topics  . Alcohol use: No    Alcohol/week: 0.0 standard drinks    Comment: occassionally  . Drug use: No     Family Hx: The patient's family history includes Lung cancer in his father.  ROS:   Please see the history of present illness.     All other systems reviewed and are negative.   Prior CV studies:   The following studies were reviewed today:  Left heat cath 05/26/18: Low normal LV function with an ejection fraction estimated 50%.  LVEDP is increased at 22 mm.  There are no focal segmental wall motion abnormalities.  Normal epicardial coronary arteries.  RECOMMENDATION: Catheterization does not identify any coronary obstructive disease.  If he continues to experience exertional chest pressure consider cardiopulmonary met testing to potentially assess for microvascular angina.  LV function is low normal with increased EDP which may account for some of his exertional dyspnea.  Consider outpatient echo Doppler study to re-assess LV systolic and diastolic function.  No indication for antiplatelet therapy at this time.  Labs/Other Tests and Data Reviewed:    EKG:  No ECG reviewed.  Recent Labs: No results found for requested labs within last 8760 hours.   Recent Lipid Panel Lab Results  Component Value Date/Time   CHOL 216 (H) 01/09/2018 09:51 AM   TRIG 91 01/09/2018 09:51 AM   HDL 55 01/09/2018 09:51 AM   CHOLHDL 3.9 01/09/2018 09:51 AM   LDLCALC 143 (H) 01/09/2018 09:51 AM     Wt Readings from Last 3 Encounters:  12/08/19 270 lb (122.5 kg)  06/17/18 282 lb (127.9 kg)  05/26/18 265 lb (120.2 kg)     Objective:    Vital Signs:  BP 117/78   Ht 6' (1.829 m)   Wt 270 lb (122.5 kg)   BMI 36.62 kg/m    VITAL SIGNS:  reviewed GEN:  no acute distress RESPIRATORY:  respirations unlabored NEURO:  alert and oriented x 3, no obvious focal deficit PSYCH:  normal affect  ASSESSMENT & PLAN:    Hypertension - BP is well controlled on lopressor and lisinopril - no change in medications - will refill both of these   Hyperlipidemia - last LDL was 143 - not on any cholesterol medication - not taking pravachol, had same muscle aches - he would prefer to have labs with PCP - will need these results faxed to [place]   Palpitations - controlled with toprol and xanax - thyroid studies normal - discussed increasing toprol should his palpitations  worsen --> he does not want to make medication changes at this time   COVID-19 Education: The signs and symptoms of COVID-19 were discussed with the patient and how to seek care for testing (follow up with PCP or arrange E-visit).  The importance of social distancing was discussed today.  Time:   Today, I have spent 15 minutes with the patient with telehealth technology discussing the above problems.     Medication Adjustments/Labs and Tests Ordered: Current medicines are reviewed at length with the patient today.  Concerns regarding medicines are outlined above.   Tests Ordered: No orders of the defined types were placed in this encounter.   Medication Changes: Meds ordered this encounter  Medications  . lisinopril (ZESTRIL) 10 MG tablet    Sig: Take 0.5 tablets (5 mg total) by mouth daily. NEED OV.    Dispense:  45 tablet    Refill:  3  . metoprolol succinate (TOPROL-XL) 50 MG 24 hr tablet    Sig: TAKE 1 TABLET BY MOUTH DAILY WITH OR IMMEDIATELY FOLLOWING A MEAL    Dispense:  90 tablet    Refill:  3     Patient needs office visit for further refills.    Follow Up:  Either In Person or Virtual in 1 year(s)  Signed, Ledora Bottcher, PA  12/08/2019 9:46 AM    [facility]

## 2019-12-08 ENCOUNTER — Encounter: Payer: Self-pay | Admitting: Physician Assistant

## 2019-12-08 ENCOUNTER — Telehealth (INDEPENDENT_AMBULATORY_CARE_PROVIDER_SITE_OTHER): Payer: 59 | Admitting: Physician Assistant

## 2019-12-08 VITALS — BP 117/78 | Ht 72.0 in | Wt 270.0 lb

## 2019-12-08 DIAGNOSIS — I10 Essential (primary) hypertension: Secondary | ICD-10-CM

## 2019-12-08 DIAGNOSIS — E785 Hyperlipidemia, unspecified: Secondary | ICD-10-CM

## 2019-12-08 DIAGNOSIS — R002 Palpitations: Secondary | ICD-10-CM

## 2019-12-08 MED ORDER — METOPROLOL SUCCINATE ER 50 MG PO TB24: ORAL_TABLET | ORAL | 3 refills | Status: DC

## 2019-12-08 MED ORDER — LISINOPRIL 10 MG PO TABS: 5.0000 mg | ORAL_TABLET | Freq: Every day | ORAL | 3 refills | Status: DC

## 2019-12-08 NOTE — Patient Instructions (Signed)
Medication Instructions:  Your Physician recommend you continue on your current medication as directed.    *If you need a refill on your cardiac medications before your next appointment, please call your pharmacy*   Lab Work: None   Testing/Procedures: None   Follow-Up: At [facility], you and your health needs are our priority.  As part of our continuing mission to provide you with exceptional heart care, we have created designated Provider Care Teams.  These Care Teams include your primary Cardiologist (physician) and Advanced Practice Providers (APPs -  Physician Assistants and Nurse Practitioners) who all work together to provide you with the care you need, when you need it.  We recommend signing up for the patient portal called "MyChart".  Sign up information is provided on this After Visit Summary.  MyChart is used to connect with patients for Virtual Visits (Telemedicine).  Patients are able to view lab/test results, encounter notes, upcoming appointments, etc.  Non-urgent messages can be sent to your provider as well.   To learn more about what you can do with MyChart, go to [email or web address].    Your next appointment:   1 year  The format for your next appointment:   In Person  Provider:   Nicki Guadalajara, MD

## 2020-03-09 ENCOUNTER — Telehealth: Payer: Self-pay | Admitting: Cardiovascular Disease

## 2020-03-09 NOTE — Telephone Encounter (Signed)
Pt had norma epicardial coronaries. Would not take NTG. Suspect decreased BP contributed to dizziness with significant healt and potential vasodilation.

## 2020-03-09 NOTE — Telephone Encounter (Signed)
Patient c/o Palpitations:  High priority if patient c/o lightheadedness, shortness of breath, or chest pain  1) How long have you had palpitations/irregular HR/ Afib? Happed a couple of night ago irregular HR     Are you having the symptoms now? no  2) Are you currently experiencing lightheadedness, SOB or CP? no  3) Do you have a history of afib (atrial fibrillation) or irregular heart rhythm? yes  4) Have you checked your BP or HR? (document readings if available): 90/60's 120's   5) Are you experiencing any other symptoms? Sweating, states his back was hurting last nigh on the left side, he has been feeling really tired.

## 2020-03-09 NOTE — Telephone Encounter (Signed)
Spoke with the patient who states that last night he was outside playing corn hole and he started having palpitations. He reports BP was between 110-120, BP 90s/60s. H estates that he was a little short of breath, lightheaded and sweating. He felt like he was going to pass out for a brief moment but went away. He does report that he felt a dull ache it his chest at one point and pain in his back. He eventually took a xanax which he states helped. Today he feels fine other than just some overall weakness and fatigue. He has not checked his blood pressure today but states that his HR is 75. He states that he does get palpitations on occasion that usually occur at night. Advised patient on use of nitroglycerin for chest pain. Patient verbalized understanding.

## 2020-03-17 NOTE — Telephone Encounter (Signed)
LVM2CB 9/2 

## 2020-04-04 NOTE — Telephone Encounter (Signed)
Called and spoke with pt, he states that he had been doing better and he had been waiting to see his regular provider. He states that they wanted to add another beta blocker but he couldn't remember the name. He reports his pressure have been good so he did not start the medication. He reports not having any issues since the last episode and denies any episodes of chest pain. States he did not take a nitroglycerin that day but has them on hand if need be.   Advised of Dr.Kelly's recommendations notified to just keep an eye on his BP because nitroglycerin can cause it to drop. Pt verbalized understanding. Pt to call in later with the name of the beta blocker he didn't start so he can get Dr.Kelly's opinion on the medication. No other questions at this time.

## 2020-12-05 ENCOUNTER — Telehealth: Payer: Self-pay | Admitting: Cardiovascular Disease

## 2020-12-05 MED ORDER — LISINOPRIL 10 MG PO TABS: 5.0000 mg | ORAL_TABLET | Freq: Every day | ORAL | 0 refills | Status: DC

## 2020-12-05 MED ORDER — METOPROLOL SUCCINATE ER 50 MG PO TB24: ORAL_TABLET | ORAL | 0 refills | Status: DC

## 2020-12-05 NOTE — Telephone Encounter (Signed)
*  STAT* If patient is at the pharmacy, call can be transferred to refill team.   1. Which medications need to be refilled? (please list name of each medication and dose if known) metoprolol succinate (TOPROL-XL) 50 MG 24 hr tablet and lisinopril (ZESTRIL) 10 MG tablet  2. Which pharmacy/location (including street and city if local pharmacy) is medication to be sent to? [facility] - [place] - [address] [address]  3. Do they need a 30 day or 90 day supply? 90   Patient almost out, was at the pharmacy but states he will go back tomorrow.

## 2020-12-07 ENCOUNTER — Other Ambulatory Visit: Payer: Self-pay

## 2021-01-04 ENCOUNTER — Telehealth: Payer: Self-pay | Admitting: Cardiovascular Disease

## 2021-01-04 MED ORDER — METOPROLOL SUCCINATE ER 50 MG PO TB24: ORAL_TABLET | ORAL | 2 refills | Status: DC

## 2021-01-04 MED ORDER — LISINOPRIL 10 MG PO TABS: 5.0000 mg | ORAL_TABLET | Freq: Every day | ORAL | 2 refills | Status: DC

## 2021-01-04 NOTE — Telephone Encounter (Signed)
*  STAT* If patient is at the pharmacy, call can be transferred to refill team.   1. Which medications need to be refilled? (please list name of each medication and dose if known)  Lisinopril and Metoprolol  2. Which pharmacy/location (including street and city if local pharmacy) is medication to be sent to? [facility], [place], [place]  3. Do they need a 30 day or 90 day supply? Need enough until his appt on 03-13-21

## 2021-01-04 NOTE — Telephone Encounter (Signed)
Let patient know that refills for his both his Lisinopril and Metoprolol has been sent over to the pharmacy.

## 2021-03-13 ENCOUNTER — Ambulatory Visit (INDEPENDENT_AMBULATORY_CARE_PROVIDER_SITE_OTHER): Payer: Managed Care, Other (non HMO) | Admitting: Physician Assistant

## 2021-03-13 ENCOUNTER — Encounter: Payer: Self-pay | Admitting: Physician Assistant

## 2021-03-13 ENCOUNTER — Other Ambulatory Visit: Payer: Self-pay

## 2021-03-13 VITALS — BP 116/82 | HR 64 | Resp 20 | Ht 72.0 in | Wt 288.8 lb

## 2021-03-13 DIAGNOSIS — F419 Anxiety disorder, unspecified: Secondary | ICD-10-CM

## 2021-03-13 DIAGNOSIS — I10 Essential (primary) hypertension: Secondary | ICD-10-CM

## 2021-03-13 DIAGNOSIS — Z0000 Encounter for general adult medical examination without abnormal findings: Secondary | ICD-10-CM

## 2021-03-13 DIAGNOSIS — R002 Palpitations: Secondary | ICD-10-CM

## 2021-03-13 MED ORDER — LISINOPRIL 10 MG PO TABS: 5.0000 mg | ORAL_TABLET | Freq: Every day | ORAL | 2 refills | Status: DC

## 2021-03-13 MED ORDER — METOPROLOL SUCCINATE ER 50 MG PO TB24: 50.0000 mg | ORAL_TABLET | Freq: Every day | ORAL | 2 refills | Status: DC

## 2021-03-13 NOTE — Patient Instructions (Signed)
Medication Instructions:   *If you need a refill on your cardiac medications before your next appointment, please call your pharmacy*   Lab Work:  If you have labs (blood work) drawn today and your tests are completely normal, you will receive your results only by: MyChart Message (if you have MyChart) OR A paper copy in the mail If you have any lab test that is abnormal or we need to change your treatment, we will call you to review the results.   Testing/Procedures: none   Follow-Up: At [facility], you and your health needs are our priority.  As part of our continuing mission to provide you with exceptional heart care, we have created designated Provider Care Teams.  These Care Teams include your primary Cardiologist (physician) and Advanced Practice Providers (APPs -  Physician Assistants and Nurse Practitioners) who all work together to provide you with the care you need, when you need it.  We recommend signing up for the patient portal called "MyChart".  Sign up information is provided on this After Visit Summary.  MyChart is used to connect with patients for Virtual Visits (Telemedicine).  Patients are able to view lab/test results, encounter notes, upcoming appointments, etc.  Non-urgent messages can be sent to your provider as well.   To learn more about what you can do with MyChart, go to [email or web address].    Your next appointment:   1 year(s)  The format for your next appointment:   In Person  Provider:   Nicki Guadalajara, MD   Other Instructions none

## 2021-03-13 NOTE — Progress Notes (Signed)
Cardiology Office Note:    Date:  03/15/2021   ID:  Joshua Simpson, DOB 11-30-1970, MRN [id number]  PCP:  Welford Roche, NP   [facility] Providers Cardiologist:  Shelva Majestic, MD     Referring MD: Welford Roche, NP   Chief Complaint  Patient presents with   Follow-up    Seen for Dr. Claiborne Billings    History of Present Illness:    Joshua Simpson is a 50 y.o. male with a hx of hypertension and history of atypical chest pain in 2011.  Echocardiogram obtained on 09/21/2015 showed EF 60 to 65% without wall motion abnormality, grade 2 DD, aortic root measuring at 36 mm, trivial MR.  Myoview in May 2018 was negative for ischemia.  Heart monitor obtained in February 2019 showed sinus rhythm with PACs and transient first-degree heart block, no A. fib or pauses noted.  Cardiac catheterization performed in November 2019 showed EF 50%, LVEDP elevated at 22 mmHg, no focal segmental wall motion abnormality, no CAD either.  If he continues to experience exertional chest pain, cardiopulmonary stress testing would be warranted to assess for possible microvascular angina.  Dr. Claiborne Billings did recommend a sleep study, however this was never completed.  Patient was last seen by Fabian Sharp PA-C via virtual visit in May 2021 at which time he continued to have palpitation controlled on Xanax.  Outpatient thyroid panel was normal.  He did not wish to increase his Toprol.   Patient presents today for follow-up.  He established with a new primary care provider who stopped prescribing his old Xanax. He was given PRN propranolol and buspar which he never started. He still notices occasional palpitation, about 7 nights out of the month.  It typically occurs at night when he is resting.  He does not have any exertional chest discomfort or worsening dyspnea.  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  EKG is normal today.  I advised he remove the metoprolol succinate to nighttime to help cover for nocturnal  palpitation.  He will continue to take the lisinopril during the day.  Unfortunately he has lost his son to drug overdose in December 2021.  He still has a daughter.  Otherwise, he can follow-up in 1 year.   Past Medical History:  Diagnosis Date   Anxiety    Hypertension     Past Surgical History:  Procedure Laterality Date   CHOLECYSTECTOMY N/A 08/14/2017   Procedure: LAPAROSCOPIC CHOLECYSTECTOMY WITH INTRAOPERATIVE CHOLANGIOGRAM;  Surgeon: Donnie Mesa, MD;  Location: [facility];  Service: General;  Laterality: N/A;   LEFT HEART CATH AND CORONARY ANGIOGRAPHY N/A 05/26/2018   Procedure: LEFT HEART CATH AND CORONARY ANGIOGRAPHY;  Surgeon: Troy Sine, MD;  Location: [place] [facility];  Service: Cardiovascular;  Laterality: N/A;   SHOULDER SURGERY      Current Medications: Current Meds  Medication Sig   cetirizine (ZYRTEC) 10 MG tablet Take 10 mg by mouth daily.   pantoprazole (PROTONIX) 40 MG tablet Take 1 tablet (40 mg total) by mouth daily as needed (GERD).   [DISCONTINUED] lisinopril (ZESTRIL) 10 MG tablet Take 0.5 tablets (5 mg total) by mouth daily. KEEP OV.   [DISCONTINUED] metoprolol succinate (TOPROL-XL) 50 MG 24 hr tablet KEEP OV.     Allergies:   Trolamine salicylate   Social History   Socioeconomic History   Marital status: Divorced    Spouse name: Not on file   Number of children: Not on file  Years of education: Not on file   Highest education level: Not on file  Occupational History   Not on file  Tobacco Use   Smoking status: Never   Smokeless tobacco: Never  Vaping Use   Vaping Use: Never used  Substance and Sexual Activity   Alcohol use: No    Alcohol/week: 0.0 standard drinks    Comment: occassionally   Drug use: No   Sexual activity: Not on file  Other Topics Concern   Not on file  Social History Narrative   Not on file   Social Determinants of Health   Financial Resource Strain: Not on file  Food Insecurity: Not on file  Transportation  Needs: Not on file  Physical Activity: Not on file  Stress: Not on file  Social Connections: Not on file     Family History: The patient's family history includes Lung cancer in his father.  ROS:   Please see the history of present illness.     All other systems reviewed and are negative.  EKGs/Labs/Other Studies Reviewed:    The following studies were reviewed today:  Cath 64/15/8309 LV end diastolic pressure is mildly elevated.   Low normal LV function with an ejection fraction estimated 50%.  LVEDP is increased at 22 mm.  There are no focal segmental wall motion abnormalities.   Normal epicardial coronary arteries.   RECOMMENDATION: Catheterization does not identify any coronary obstructive disease.  If he continues to experience exertional chest pressure consider cardiopulmonary met testing to potentially assess for microvascular angina.  LV function is low normal with increased EDP which may account for some of his exertional dyspnea.  Consider outpatient echo Doppler study to re-assess LV systolic and diastolic function.   No indication for antiplatelet therapy at this time.  EKG:  EKG is ordered today.  The ekg ordered today demonstrates normal sinus rhythm, no significant ST-T wave changes  Recent Labs: No results found for requested labs within last 8760 hours.  Recent Lipid Panel    Component Value Date/Time   CHOL 216 (H) 01/09/2018 0951   TRIG 91 01/09/2018 0951   HDL 55 01/09/2018 0951   CHOLHDL 3.9 01/09/2018 0951   LDLCALC 143 (H) 01/09/2018 0951     Risk Assessment/Calculations:           Physical Exam:    VS:  BP 116/82   Pulse 64   Resp 20   Ht 6' (1.829 m)   Wt 288 lb 12.8 oz (131 kg)   SpO2 96%   BMI 39.17 kg/m     Wt Readings from Last 3 Encounters:  03/13/21 288 lb 12.8 oz (131 kg)  12/08/19 270 lb (122.5 kg)  06/17/18 282 lb (127.9 kg)     GEN:  Well nourished, well developed in no acute distress HEENT: Normal NECK: No JVD; No  carotid bruits LYMPHATICS: No lymphadenopathy CARDIAC: RRR, no murmurs, rubs, gallops RESPIRATORY:  Clear to auscultation without rales, wheezing or rhonchi  ABDOMEN: Soft, non-tender, non-distended MUSCULOSKELETAL:  No edema; No deformity  SKIN: Warm and dry NEUROLOGIC:  Alert and oriented x 3 PSYCHIATRIC:  Normal affect   ASSESSMENT:    1. Palpitations   2. Essential hypertension   3. Annual physical exam   4. Anxiety    PLAN:    In order of problems listed above:  Palpitation: He described majority of palpitation happening at night.  I recommended he move metoprolol succinate to nighttime to help cover the nocturnal palpitation.  Hypertension: Blood pressure well controlled on current therapy  Annual physical exam: Stable from a cardiac perspective.  No significant heart murmur on exam.  Heart rate regular  Anxiety: He was previously taking daily dosing of Xanax.  This was stopped after he was established with a new primary care provider.  He has been given as needed dose of propranolol and BuSpar.  He has not started on either one.  He will need to work on his anxiety issue with his PCP.       Medication Adjustments/Labs and Tests Ordered: Current medicines are reviewed at length with the patient today.  Concerns regarding medicines are outlined above.  Orders Placed This Encounter  Procedures   EKG 12-Lead   Meds ordered this encounter  Medications   metoprolol succinate (TOPROL-XL) 50 MG 24 hr tablet    Sig: Take 1 tablet (50 mg total) by mouth daily.    Dispense:  30 tablet    Refill:  2   lisinopril (ZESTRIL) 10 MG tablet    Sig: Take 0.5 tablets (5 mg total) by mouth daily.    Dispense:  15 tablet    Refill:  2    Patient Instructions  Medication Instructions:   *If you need a refill on your cardiac medications before your next appointment, please call your pharmacy*   Lab Work:  If you have labs (blood work) drawn today and your tests are completely  normal, you will receive your results only by: [place] (if you have MyChart) OR A paper copy in the mail If you have any lab test that is abnormal or we need to change your treatment, we will call you to review the results.   Testing/Procedures: none   Follow-Up: At [facility], you and your health needs are our priority.  As part of our continuing mission to provide you with exceptional heart care, we have created designated Provider Care Teams.  These Care Teams include your primary Cardiologist (physician) and Advanced Practice Providers (APPs -  Physician Assistants and Nurse Practitioners) who all work together to provide you with the care you need, when you need it.  We recommend signing up for the patient portal called "MyChart".  Sign up information is provided on this After Visit Summary.  MyChart is used to connect with patients for Virtual Visits (Telemedicine).  Patients are able to view lab/test results, encounter notes, upcoming appointments, etc.  Non-urgent messages can be sent to your provider as well.   To learn more about what you can do with MyChart, go to [email or web address].    Your next appointment:   1 year(s)  The format for your next appointment:   In Person  Provider:   Shelva Majestic, MD   Other Instructions none   Signed, Almyra Deforest, [place]  03/15/2021 6:51 PM    [place]

## 2021-03-15 ENCOUNTER — Encounter: Payer: Self-pay | Admitting: Physician Assistant

## 2021-06-07 ENCOUNTER — Emergency Department (HOSPITAL_BASED_OUTPATIENT_CLINIC_OR_DEPARTMENT_OTHER)
Admission: EM | Admit: 2021-06-07 | Discharge: 2021-06-07 | Disposition: A | Discharge status: Home / Self Care | Payer: Managed Care, Other (non HMO) | Attending: Emergency Medicine | Admitting: Emergency Medicine

## 2021-06-07 ENCOUNTER — Other Ambulatory Visit: Payer: Self-pay

## 2021-06-07 ENCOUNTER — Telehealth: Payer: Self-pay | Admitting: Cardiovascular Disease

## 2021-06-07 ENCOUNTER — Encounter (HOSPITAL_BASED_OUTPATIENT_CLINIC_OR_DEPARTMENT_OTHER): Payer: Self-pay | Admitting: Emergency Medicine

## 2021-06-07 ENCOUNTER — Emergency Department (HOSPITAL_BASED_OUTPATIENT_CLINIC_OR_DEPARTMENT_OTHER): Payer: Managed Care, Other (non HMO)

## 2021-06-07 DIAGNOSIS — R002 Palpitations: Secondary | ICD-10-CM | POA: Diagnosis present

## 2021-06-07 DIAGNOSIS — Z20822 Contact with and (suspected) exposure to covid-19: Secondary | ICD-10-CM | POA: Insufficient documentation

## 2021-06-07 DIAGNOSIS — I4891 Unspecified atrial fibrillation: Secondary | ICD-10-CM | POA: Diagnosis not present

## 2021-06-07 DIAGNOSIS — Z955 Presence of coronary angioplasty implant and graft: Secondary | ICD-10-CM | POA: Diagnosis not present

## 2021-06-07 DIAGNOSIS — Z79899 Other long term (current) drug therapy: Secondary | ICD-10-CM | POA: Insufficient documentation

## 2021-06-07 DIAGNOSIS — I10 Essential (primary) hypertension: Secondary | ICD-10-CM | POA: Insufficient documentation

## 2021-06-07 LAB — CBC WITH DIFFERENTIAL/PLATELET
Abs Immature Granulocytes: 0.03 10*3/uL (ref 0.00–0.07)
Basophils Absolute: 0.1 10*3/uL (ref 0.0–0.1)
Basophils Relative: 1 %
Eosinophils Absolute: 0.6 10*3/uL — ABNORMAL HIGH (ref 0.0–0.5)
Eosinophils Relative: 7 %
HCT: 52.5 % — ABNORMAL HIGH (ref 39.0–52.0)
Hemoglobin: 18.5 g/dL — ABNORMAL HIGH (ref 13.0–17.0)
Immature Granulocytes: 0 %
Lymphocytes Relative: 24 %
Lymphs Abs: 2.3 10*3/uL (ref 0.7–4.0)
MCH: 32 pg (ref 26.0–34.0)
MCHC: 35.2 g/dL (ref 30.0–36.0)
MCV: 90.8 fL (ref 80.0–100.0)
Monocytes Absolute: 0.8 10*3/uL (ref 0.1–1.0)
Monocytes Relative: 9 %
Neutro Abs: 5.6 10*3/uL (ref 1.7–7.7)
Neutrophils Relative %: 59 %
Platelets: 241 10*3/uL (ref 150–400)
RBC: 5.78 MIL/uL (ref 4.22–5.81)
RDW: 12.3 % (ref 11.5–15.5)
WBC: 9.4 10*3/uL (ref 4.0–10.5)
nRBC: 0 % (ref 0.0–0.2)

## 2021-06-07 LAB — BASIC METABOLIC PANEL
Anion gap: 9 (ref 5–15)
BUN: 13 mg/dL (ref 6–20)
CO2: 25 mmol/L (ref 22–32)
Calcium: 10.3 mg/dL (ref 8.9–10.3)
Chloride: 105 mmol/L (ref 98–111)
Creatinine, Ser: 1.15 mg/dL (ref 0.61–1.24)
GFR, Estimated: >60 mL/min (ref >60)
Glucose, Bld: 119 mg/dL — ABNORMAL HIGH (ref 70–99)
Potassium: 3.8 mmol/L (ref 3.5–5.1)
Sodium: 139 mmol/L (ref 135–145)

## 2021-06-07 LAB — RESP PANEL BY RT-PCR (FLU A&B, COVID) ARPGX2
Influenza A by PCR: NEGATIVE
Influenza B by PCR: NEGATIVE
SARS Coronavirus 2 by RT PCR: NEGATIVE

## 2021-06-07 LAB — RAPID URINE DRUG SCREEN, HOSP PERFORMED
Amphetamines: NOT DETECTED
Barbiturates: NOT DETECTED
Benzodiazepines: NOT DETECTED
Cocaine: NOT DETECTED
Opiates: NOT DETECTED
Tetrahydrocannabinol: NOT DETECTED

## 2021-06-07 LAB — URINALYSIS, ROUTINE W REFLEX MICROSCOPIC
Bilirubin Urine: NEGATIVE
Glucose, UA: NEGATIVE mg/dL
Hgb urine dipstick: NEGATIVE
Ketones, ur: NEGATIVE mg/dL
Leukocytes,Ua: NEGATIVE
Nitrite: NEGATIVE
Protein, ur: NEGATIVE mg/dL
Specific Gravity, Urine: 1.01 (ref 1.005–1.030)
pH: 6 (ref 5.0–8.0)

## 2021-06-07 LAB — TROPONIN I (HIGH SENSITIVITY): Troponin I (High Sensitivity): 2 ng/L (ref <18)

## 2021-06-07 LAB — TSH: TSH: 1.934 u[IU]/mL (ref 0.350–4.500)

## 2021-06-07 LAB — PHOSPHORUS: Phosphorus: 2.8 mg/dL (ref 2.5–4.6)

## 2021-06-07 LAB — PROTIME-INR
INR: 0.9 (ref 0.8–1.2)
Prothrombin Time: 12.6 seconds (ref 11.4–15.2)

## 2021-06-07 LAB — MAGNESIUM: Magnesium: 2.1 mg/dL (ref 1.7–2.4)

## 2021-06-07 MED ORDER — METOPROLOL TARTRATE 5 MG/5ML IV SOLN
5.0000 mg | Freq: Once | INTRAVENOUS | Status: AC
  Administered 2021-06-07: 5 mg via INTRAVENOUS
  Filled 2021-06-07: qty 5

## 2021-06-07 MED ORDER — METOPROLOL TARTRATE 25 MG PO TABS: 25.0000 mg | ORAL_TABLET | Freq: Every day | ORAL | 0 refills | Status: DC | PRN

## 2021-06-07 MED ORDER — METOPROLOL TARTRATE 25 MG PO TABS
25.0000 mg | ORAL_TABLET | Freq: Once | ORAL | Status: AC
  Administered 2021-06-07: 25 mg via ORAL
  Filled 2021-06-07: qty 1

## 2021-06-07 MED ORDER — SODIUM CHLORIDE 0.9 % IV SOLN: Freq: Once | INTRAVENOUS | Status: AC

## 2021-06-07 NOTE — Discharge Instructions (Addendum)
An ambulatory referral was placed for the atrial fibrillation clinic to call you within the week.  If you do not hear a call, follow-up with your cardiologist within the week.  Return back to the ER if you have chest pain difficulty breathing or continued rapid heart rate.  Take daily 81 mg of aspirin every day.  If your heart rate is consistently above 120 bpm, you may take an additional 25 mg of metoprolol in addition to your regular dosage of 50 mg metoprolol.

## 2021-06-07 NOTE — ED Provider Notes (Signed)
[place] EMERGENCY DEPT Provider Note   CSN: [id number] Arrival date & time: 06/07/21  1415     History Chief Complaint  Patient presents with   Irregular Heart Beat    Joshua Simpson is a 50 y.o. male.  HPI Patient reports he has had fairly longstanding problems with palpitations and been diagnosed with PACs.  He denies any prior history of atrial fibrillation.  He does take nightly Toprol-XL.  Last dose was at about 530 last night.  Patient reports that he was seeing his primary care doctor today because he has had about 3 to 4 weeks of sinus congestion that would not seem to resolve.  He denies he has significant amount of cough.  Is been fairly mild.  He reports sometimes he feels like it is a little hard to take of breath but he never feels like he is actually short of breath.  He denies exertional shortness of breath.  Patient reports he does experience the symptoms more often at nighttime where he will awaken with his heart racing and sometimes takes a Xanax.  He reports that on the way over he did take a half a Xanax to help with these symptoms.  His doctor told him that his heart rate was very elevated and wanted him seen at the emergency department.  He reports he really did not feel very symptomatic relative to other points in time.  Patient reports his father has history of atrial fibrillation.  Patient is a non-smoker.  Occasional alcohol on the weekend.  No drugs of abuse.    Past Medical History:  Diagnosis Date   Anxiety    Hypertension     Patient Active Problem List   Diagnosis Date Noted   Exertional chest pain    Exertional dyspnea    Chronic cholecystitis with calculus 08/14/2017   Palpitations 08/15/2015   Obesity (BMI 30-39.9) 08/15/2015   ABDOMINAL PAIN, LEFT UPPER QUADRANT 04/25/2010   Anxiety state 04/18/2010   Essential hypertension 04/18/2010   PAIN IN JOINT, ANKLE AND FOOT 04/18/2010   EPIGASTRIC PAIN 04/18/2010    Past Surgical  History:  Procedure Laterality Date   CHOLECYSTECTOMY N/A 08/14/2017   Procedure: LAPAROSCOPIC CHOLECYSTECTOMY WITH INTRAOPERATIVE CHOLANGIOGRAM;  Surgeon: Donnie Mesa, MD;  Location: [facility];  Service: General;  Laterality: N/A;   LEFT HEART CATH AND CORONARY ANGIOGRAPHY N/A 05/26/2018   Procedure: LEFT HEART CATH AND CORONARY ANGIOGRAPHY;  Surgeon: Troy Sine, MD;  Location: [place] [facility];  Service: Cardiovascular;  Laterality: N/A;   SHOULDER SURGERY         Family History  Problem Relation Age of Onset   Lung cancer Father     Social History   Tobacco Use   Smoking status: Never   Smokeless tobacco: Never  Vaping Use   Vaping Use: Never used  Substance Use Topics   Alcohol use: No    Alcohol/week: 0.0 standard drinks    Comment: occassionally   Drug use: No    Home Medications Prior to Admission medications   Medication Sig Start Date End Date Taking? Authorizing Provider  cetirizine (ZYRTEC) 10 MG tablet Take 10 mg by mouth daily.    [provider]  lisinopril (ZESTRIL) 10 MG tablet Take 0.5 tablets (5 mg total) by mouth daily. 03/13/21   Almyra Deforest, PA  metoprolol succinate (TOPROL-XL) 50 MG 24 hr tablet Take 1 tablet (50 mg total) by mouth daily. 03/13/21   Almyra Deforest, PA  nitroGLYCERIN (  NITROSTAT) 0.4 MG SL tablet Place 1 tablet (0.4 mg total) under the tongue every 5 (five) minutes as needed for chest pain. 05/15/18 12/08/19  Azalee Course, PA  pantoprazole (PROTONIX) 40 MG tablet Take 1 tablet (40 mg total) by mouth daily as needed (GERD). 10/01/17   Jodelle Gross, NP    Allergies    Trolamine salicylate  Review of Systems   Review of Systems 10 systems reviewed and negative except as per HPI Physical Exam Updated Vital Signs BP 119/88 (BP Location: Left Arm)   Pulse (!) 54   Temp (!) 97.5 F (36.4 C)   Resp (!) 23   Ht 6' (1.829 m)   Wt 127 kg   SpO2 98%   BMI 37.97 kg/m   Physical Exam Constitutional:      Appearance: Normal  appearance.     Comments: No distress.  Alert.  Well-nourished well-developed  HENT:     Mouth/Throat:     Pharynx: Oropharynx is clear.  Eyes:     Extraocular Movements: Extraocular movements intact.  Cardiovascular:     Comments: Tachycardia irregularly irregular Pulmonary:     Effort: Pulmonary effort is normal.     Breath sounds: Normal breath sounds.  Abdominal:     General: There is no distension.     Palpations: Abdomen is soft.     Tenderness: There is no abdominal tenderness. There is no guarding.  Musculoskeletal:        General: No swelling or tenderness. Normal range of motion.     Right lower leg: No edema.     Left lower leg: No edema.  Skin:    General: Skin is warm and dry.  Neurological:     General: No focal deficit present.     Mental Status: He is alert and oriented to person, place, and time.     Motor: No weakness.     Coordination: Coordination normal.  Psychiatric:        Mood and Affect: Mood normal.    ED Results / Procedures / Treatments   Labs (all labs ordered are listed, but only abnormal results are displayed) Labs Reviewed  BASIC METABOLIC PANEL - Abnormal; Notable for the following components:      Result Value   Glucose, Bld 119 (*)    All other components within normal limits  CBC WITH DIFFERENTIAL/PLATELET - Abnormal; Notable for the following components:   Hemoglobin 18.5 (*)    HCT 52.5 (*)    Eosinophils Absolute 0.6 (*)    All other components within normal limits  RESP PANEL BY RT-PCR (FLU A&B, COVID) ARPGX2  PROTIME-INR  RAPID URINE DRUG SCREEN, HOSP PERFORMED  URINALYSIS, ROUTINE W REFLEX MICROSCOPIC  MAGNESIUM  PHOSPHORUS  TSH  TROPONIN I (HIGH SENSITIVITY)  TROPONIN I (HIGH SENSITIVITY)    EKG EKG Interpretation  Date/Time:  Wednesday June 07 2021 14:22:27 EST Ventricular Rate:  161 PR Interval:    QRS Duration: 76 QT Interval:  292 QTC Calculation: 477 R Axis:   0 Text Interpretation: Atrial  fibrillation with rapid ventricular response Nonspecific ST abnormality Abnormal ECG agree, afib new compared to previous Confirmed by Arby Barrette [phone]) on 06/07/2021 4:14:53 PM  Radiology DG Chest Port 1 View  Result Date: 06/07/2021 CLINICAL DATA:  Tachycardia, irregular heartbeat in a 50 year old male. EXAM: PORTABLE CHEST 1 VIEW COMPARISON:  October 21, 2017. FINDINGS: EKG leads project over the chest. Cardiomediastinal contours and hilar structures are normal. Lungs are clear. No effusion.  No consolidation or visible pneumothorax. On limited assessment there is no acute skeletal process. IMPRESSION: No acute cardiopulmonary disease. Electronically Signed   By: Zetta Bills M.D.   On: 06/07/2021 14:58    Procedures Procedures  CRITICAL CARE Performed by: Charlesetta Shanks   Total critical care time: 20 minutes  Critical care time was exclusive of separately billable procedures and treating other patients.  Critical care was necessary to treat or prevent imminent or life-threatening deterioration.  Critical care was time spent personally by me on the following activities: development of treatment plan with patient and/or surrogate as well as nursing, discussions with consultants, evaluation of patient's response to treatment, examination of patient, obtaining history from patient or surrogate, ordering and performing treatments and interventions, ordering and review of laboratory studies, ordering and review of radiographic studies, pulse oximetry and re-evaluation of patient's condition.  Medications Ordered in ED Medications  metoprolol tartrate (LOPRESSOR) tablet 25 mg (has no administration in time range)  0.9 %  sodium chloride infusion ( Intravenous New Bag/Given 06/07/21 1512)  metoprolol tartrate (LOPRESSOR) injection 5 mg (5 mg Intravenous Given 06/07/21 1514)    ED Course  I have reviewed the triage vital signs and the nursing notes.  Pertinent labs & imaging results  that were available during my care of the patient were reviewed by me and considered in my medical decision making (see chart for details).    MDM Rules/Calculators/A&P                           Patient is referred to the emergency department from outpatient office for rapid heart rate.  Patient does not have prior known diagnosis of atrial fibrillation.  No clear time of onset.  Patient was not aware of symptoms.  He describes intermittent episodes of heart racing and palpitations for a long time.  This is been previously diagnosed as PACs.  Patient took his Toprol last night.  At this time heart rate is 140s to 160s and will add an IV dose of Lopressor for rate control.  We will proceed with diagnostic evaluation of electrolytes TSH chest x-ray.  Transition of care to oncoming Dr. Almyra Free for review of diagnostic results and final disposition for new onset atrial fibrillation.  However by patient's history I suspect he may have proximal atrial fibrillation for more prolonged period of time.  At this time his respiratory status and mental status are stable. Final Clinical Impression(s) / ED Diagnoses Final diagnoses:  Atrial fibrillation with rapid ventricular response [facility])    Rx / DC Orders ED Discharge Orders     None        Charlesetta Shanks, MD 06/07/21 1617

## 2021-06-07 NOTE — Telephone Encounter (Signed)
  Gavin Pound called to f/u

## 2021-06-07 NOTE — Telephone Encounter (Signed)
Patient c/o Palpitations:  High priority if patient c/o lightheadedness, shortness of breath, or chest pain  How long have you had palpitations/irregular HR/ Afib? Are you having the symptoms now? AFIB WITH RVR   Are you currently experiencing lightheadedness, SOB or CP? NO   Do you have a history of afib (atrial fibrillation) or irregular heart rhythm? 5 YEARS AGO  Have you checked your BP or HR? (document readings if available): 125/84 HR 84  Are you experiencing any other symptoms? CONGESTION  COVID TEST IS NEGATIVE EKG SAID PT IS IN AFIB WITH RVR

## 2021-06-07 NOTE — ED Triage Notes (Signed)
Irreg heart beat that started last night , went to dr and had ekg  and it said aFib with RVR, some sob he states

## 2021-06-07 NOTE — ED Provider Notes (Signed)
Was signed out to me by prior physician with a diagnosis of new onset atrial fibrillation with rapid rate.  Patient appears comfortable with no pain or distress.  Given multiple doses of IV and oral metoprolol with subsequent heart rate improved to 95 to 110 bpm range.  Patient monitored for several hours with no additional adverse events, remains in atrial fibrillation but not CHA2DS2-VASc score is low.  Given aspirin here in the ER advised outpatient follow-up with atrial for lab.  Advised immediate return for pain difficulty breathing worsening symptoms or any additional concerns.  He takes metoprolol 50 mg daily, advised to take additional 25 mg tablet should his heart rate remained persistently above 120 bpm.  Patient was understanding, discharged home in stable condition.    Cheryll Cockayne, MD 06/07/21 1816

## 2021-06-07 NOTE — Telephone Encounter (Signed)
Received a call from [place] with [facility].Patient is in their office EKG revealed Afib with RVR.Advised to go to ED.I will make Dr.Kelly aware.

## 2021-06-07 NOTE — Telephone Encounter (Addendum)
Called Deborah back. She states the pt is scared and does not want to go to the emergency department. She asked if there was anything we could do in the office. I told her it was recommended he go to the emergency department because they have more resources to handle this matter and coming to the office would delay his care. She was advised they could go to [facility] and be seen, if the pt needs to be transported to another facility it could be done from there. She thanked me for calling her back.

## 2021-06-10 ENCOUNTER — Other Ambulatory Visit: Payer: Self-pay | Admitting: Cardiovascular Disease

## 2021-06-12 ENCOUNTER — Telehealth (HOSPITAL_COMMUNITY): Payer: Self-pay

## 2021-06-12 NOTE — Telephone Encounter (Signed)
Contacted patient regarding ED follow up appt. He is scheduled to be seen at the [facility] on 12/6 @ 2:30pm with [facility]. Patient given directions and the parking code to access garage for parking. Patient verbalized understanding.

## 2021-06-13 ENCOUNTER — Telehealth: Payer: Self-pay | Admitting: Physician Assistant

## 2021-06-13 NOTE — Telephone Encounter (Signed)
   Pt wife, Eunice Blase called because pt having more Afib.   He had a prolonged episode this am, and then started feeling bad again this pm.   He feel bloated, took Gas-X, still some discomfort.  Pt has been having a lot of atrial fib today. He had another episode this pm, with HR up to 130 by pulse ox.   He has taken Lopressor 25 mg today and took his Toprol XL 50 mg also (takes at bedtime).  Has not been anticoagulated, but is taking ASA 81 mg qd.    Has appt 12/06.   Have sent message to the Afib clinic to move it up.   Advised him that if he continues to feel bad, can go to the ER.   But suggested that he give it a little time, to see if the meds will work. He also took a small dose of Xanax, that has helped in the past.   If he continues to feel bad, says he will not get any sleep. May end up going to the ER later.  Of note, has been using Mucinex nasal spray. Oxymetazoline has the potential to promote Afib, it is a stimulant (same active ingredient as Afrin). Asked him to stop using that and try saline nasal spray or Flonase.   Theodore Demark, PA-C 06/13/2021 7:20 PM

## 2021-06-15 ENCOUNTER — Encounter (HOSPITAL_COMMUNITY): Payer: Self-pay | Admitting: Physician Assistant

## 2021-06-15 ENCOUNTER — Ambulatory Visit (HOSPITAL_COMMUNITY)
Admission: RE | Admit: 2021-06-15 | Discharge: 2021-06-15 | Disposition: A | Discharge status: Home / Self Care | Payer: Managed Care, Other (non HMO) | Source: Ambulatory Visit | Attending: Physician Assistant | Admitting: Physician Assistant

## 2021-06-15 ENCOUNTER — Other Ambulatory Visit: Payer: Self-pay

## 2021-06-15 VITALS — BP 136/100 | HR 78 | Ht 72.0 in | Wt 290.8 lb

## 2021-06-15 DIAGNOSIS — E669 Obesity, unspecified: Secondary | ICD-10-CM | POA: Diagnosis not present

## 2021-06-15 DIAGNOSIS — Z7901 Long term (current) use of anticoagulants: Secondary | ICD-10-CM | POA: Diagnosis not present

## 2021-06-15 DIAGNOSIS — I480 Paroxysmal atrial fibrillation: Secondary | ICD-10-CM | POA: Diagnosis not present

## 2021-06-15 DIAGNOSIS — I110 Hypertensive heart disease with heart failure: Secondary | ICD-10-CM | POA: Diagnosis not present

## 2021-06-15 DIAGNOSIS — R400 Somnolence: Secondary | ICD-10-CM | POA: Diagnosis not present

## 2021-06-15 DIAGNOSIS — Z6839 Body mass index (BMI) 39.0-39.9, adult: Secondary | ICD-10-CM | POA: Diagnosis not present

## 2021-06-15 DIAGNOSIS — R0683 Snoring: Secondary | ICD-10-CM | POA: Diagnosis not present

## 2021-06-15 LAB — COMPREHENSIVE METABOLIC PANEL
ALT: 65 U/L — ABNORMAL HIGH (ref 0–44)
AST: 47 U/L — ABNORMAL HIGH (ref 15–41)
Albumin: 4.3 g/dL (ref 3.5–5.0)
Alkaline Phosphatase: 62 U/L (ref 38–126)
Anion gap: 9 (ref 5–15)
BUN: 10 mg/dL (ref 6–20)
CO2: 24 mmol/L (ref 22–32)
Calcium: 9.1 mg/dL (ref 8.9–10.3)
Chloride: 104 mmol/L (ref 98–111)
Creatinine, Ser: 1.05 mg/dL (ref 0.61–1.24)
GFR, Estimated: >60 mL/min (ref >60)
Glucose, Bld: 103 mg/dL — ABNORMAL HIGH (ref 70–99)
Potassium: 3.8 mmol/L (ref 3.5–5.1)
Sodium: 137 mmol/L (ref 135–145)
Total Bilirubin: 1.7 mg/dL — ABNORMAL HIGH (ref 0.3–1.2)
Total Protein: 7 g/dL (ref 6.5–8.1)

## 2021-06-15 MED ORDER — MULTAQ 400 MG PO TABS: 400.0000 mg | ORAL_TABLET | Freq: Two times a day (BID) | ORAL | 3 refills | Status: DC

## 2021-06-15 NOTE — Patient Instructions (Signed)
Start Multaq 400mg twice a day WITH FOOD 

## 2021-06-15 NOTE — Telephone Encounter (Signed)
Visit today with Alphonzo Severance, PA-

## 2021-06-15 NOTE — Telephone Encounter (Signed)
Patient seen in ED.  Visit today with Alphonzo Severance, PA

## 2021-06-15 NOTE — Progress Notes (Signed)
Primary Care Physician: Adela Glimpse, NP Primary Cardiologist: Dr Tresa Endo Primary Electrophysiologist: none Referring Physician: [facility]   Joshua [place] is a 50 y.o. male with a history of HTN and atrial fibrillation who presents for consultation in the [facility].  The patient was initially diagnosed with atrial fibrillation 06/07/21 after presenting to his PCP with sinus congestion. ECG showed rapid afib and he was sent to the ED. He was discharged in rate controlled afib. He has a long history of PACs and palpitations. Patient has a CHADS2VASC score of 1. He converted to SR after leaving the ED. He had another episode of heart racing 2 days ago which lasted about 8 hours. He drinks alcohol occasionally but does report snoring and daytime somnolence.   Today, he denies symptoms of chest pain, shortness of breath, orthopnea, PND, lower extremity edema, dizziness, presyncope, syncope, bleeding, or neurologic sequela. The patient is tolerating medications without difficulties and is otherwise without complaint today.    Atrial Fibrillation Risk Factors:  he does have symptoms or diagnosis of sleep apnea. he is agreeable for sleep study.  he does not have a history of rheumatic fever. he does have a history of alcohol use. The patient does have a history of early familial atrial fibrillation or other arrhythmias. Mother, father, sister have afib.  he has a BMI of Body mass index is 39.44 kg/m.Marland Kitchen Filed Weights   06/15/21 1516  Weight: 131.9 kg    Family History  Problem Relation Age of Onset   Lung cancer Father      Atrial Fibrillation Management history:  Previous antiarrhythmic drugs: none Previous cardioversions: none Previous ablations: none CHADS2VASC score: 1 Anticoagulation history: none   Past Medical History:  Diagnosis Date   Anxiety    Hypertension    Past Surgical History:  Procedure Laterality Date   CHOLECYSTECTOMY  N/A 08/14/2017   Procedure: LAPAROSCOPIC CHOLECYSTECTOMY WITH INTRAOPERATIVE CHOLANGIOGRAM;  Surgeon: Manus Rudd, MD;  Location: [facility] OR;  Service: General;  Laterality: N/A;   LEFT HEART CATH AND CORONARY ANGIOGRAPHY N/A 05/26/2018   Procedure: LEFT HEART CATH AND CORONARY ANGIOGRAPHY;  Surgeon: Lennette Bihari, MD;  Location: [facility] INVAS[facility] CV [facility];  Service: Cardiovascular;  Laterality: N/A;   SHOULDER SURGERY      Current Outpatient Medications  Medication Sig Dispense Refill   ALPRAZolam (XANAX) 0.25 MG tablet Take 0.25 mg by mouth daily as needed.     amoxicillin-clavulanate (AUGMENTIN) 875-125 MG tablet Take 1 tablet by mouth 2 (two) times daily.     cetirizine (ZYRTEC) 10 MG tablet Take 10 mg by mouth daily.     fluticasone (FLONASE) 50 MCG/ACT nasal spray Place 1 spray into both nostrils 2 (two) times daily.     lisinopril (ZESTRIL) 10 MG tablet TAKE 1/2 TABLET BY MOUTH DAILY 45 tablet 3   metoprolol succinate (TOPROL-XL) 50 MG 24 hr tablet Take 1 tablet (50 mg total) by mouth daily. 30 tablet 2   metoprolol tartrate (LOPRESSOR) 25 MG tablet Take 1 tablet (25 mg total) by mouth daily as needed for up to 15 doses (Take 25 mg once daily if your heart rate is consistently above 120 bpm.). 15 tablet 0   nitroGLYCERIN (NITROSTAT) 0.4 MG SL tablet Place 1 tablet (0.4 mg total) under the tongue every 5 (five) minutes as needed for chest pain. 25 tablet 4   No current facility-administered medications for this encounter.    Allergies  Allergen Reactions  Trolamine Salicylate Rash    Social History   Socioeconomic History   Marital status: Divorced    Spouse name: Not on file   Number of children: Not on file   Years of education: Not on file   Highest education level: Not on file  Occupational History   Not on file  Tobacco Use   Smoking status: Never   Smokeless tobacco: Never  Vaping Use   Vaping Use: Never used  Substance and Sexual Activity   Alcohol use: Yes     Alcohol/week: 6.0 - 8.0 standard drinks    Types: 6 - 8 Cans of beer per week    Comment: occassionally   Drug use: No   Sexual activity: Not on file  Other Topics Concern   Not on file  Social History Narrative   Not on file   Social Determinants of Health   Financial Resource Strain: Not on file  Food Insecurity: Not on file  Transportation Needs: Not on file  Physical Activity: Not on file  Stress: Not on file  Social Connections: Not on file  Intimate Partner Violence: Not on file     ROS- All systems are reviewed and negative except as per the HPI above.  Physical Exam: Vitals:   06/15/21 1516  BP: (!) 136/100  Pulse: 78  Weight: 131.9 kg  Height: 6' (1.829 m)    GEN- The patient is a well appearing obese male, alert and oriented x 3 today.   Head- normocephalic, atraumatic Eyes-  Sclera clear, conjunctiva pink Ears- hearing intact Oropharynx- clear Neck- supple  Lungs- Clear to ausculation bilaterally, normal work of breathing Heart- Regular rate and rhythm, no murmurs, rubs or gallops  GI- soft, NT, ND, + BS Extremities- no clubbing, cyanosis, or edema MS- no significant deformity or atrophy Skin- no rash or lesion Psych- euthymic mood, full affect Neuro- strength and sensation are intact  Wt Readings from Last 3 Encounters:  06/15/21 131.9 kg  06/07/21 127 kg  03/13/21 131 kg    EKG today demonstrates  SR Vent. rate 78 BPM PR interval 188 ms QRS duration 82 ms QT/QTcB 392/446 ms  Echo 09/21/15 demonstrated  - Left ventricle: The cavity size was normal. Wall thickness was    increased in a pattern of mild LVH. Systolic function was normal.    The estimated ejection fraction was in the range of 60% to 65%.    Wall motion was normal; there were no regional wall motion    abnormalities. Features are consistent with a pseudonormal left    ventricular filling pattern, with concomitant abnormal relaxation    and increased filling pressure (grade 2  diastolic dysfunction).  - Aortic valve: There was no stenosis.  - Aorta: Borderline dilated aortic root. Aortic root dimension: 37    mm (ED).  - Mitral valve: There was trivial regurgitation.  - Right ventricle: The cavity size was mildly dilated. Systolic    function was normal.  - Pulmonary arteries: No complete TR doppler jet so unable to    estimate PA systolic pressure.  - Inferior vena cava: The vessel was normal in size. The    respirophasic diameter changes were in the normal range (= 50%),    consistent with normal central venous pressure.   Impressions:   - Normal LV size with mild LV hypertrophy. EF 60-65%. Moderate    diastolic dysfunction. Mildly dilated RV with normal systolic    function. No significant valvular abnormalities.  Epic records are reviewed at length today  CHA2DS2-VASc Score = 1  The patient's score is based upon: CHF History: 0 HTN History: 1 Diabetes History: 0 Stroke History: 0 Vascular Disease History: 0 Age Score: 0 Gender Score: 0      ASSESSMENT AND PLAN: 1. Paroxysmal Atrial Fibrillation (ICD10:  I48.0) The patient's CHA2DS2-VASc score is 1, indicating a 0.6% annual risk of stroke.   General education about afib provided and questions answered. We also discussed his stroke risk and the risks and benefits of anticoagulation. We discussed rhythm control options today including AAD.  Will start Multaq 400 mg BID. Check cmet today.  Continue Toprol 50 mg daily with Lopressor 25 mg q 6 hours as needed for heart racing. No anticoagulation indicated at this time with low CV score.  2. Obesity Body mass index is 39.44 kg/m. Lifestyle modification was discussed at length including regular exercise and weight reduction.  3. Snoring/daytime somnolence  The importance of adequate treatment of sleep apnea was discussed today in order to improve our ability to maintain sinus rhythm long term. Will refer for sleep study.   4. HTN Stable, no  changes today.   Follow up in the AF clinic next week for ECG.    [facility] [address] [place], [place] [address] [phone] 06/15/2021 3:30 PM

## 2021-06-16 ENCOUNTER — Telehealth: Payer: Self-pay | Admitting: *Deleted

## 2021-06-16 ENCOUNTER — Other Ambulatory Visit: Payer: Self-pay | Admitting: Physician Assistant

## 2021-06-16 DIAGNOSIS — R400 Somnolence: Secondary | ICD-10-CM

## 2021-06-16 DIAGNOSIS — I10 Essential (primary) hypertension: Secondary | ICD-10-CM

## 2021-06-16 DIAGNOSIS — I480 Paroxysmal atrial fibrillation: Secondary | ICD-10-CM

## 2021-06-16 NOTE — Telephone Encounter (Signed)
Patient notified that [employer] denied in lab sleep study due to no co morbidities. Recommended HST, which does not need a PA . HST ordered and scheduled. Patient notified of appointment details. He asked to be placed also on their cancellation list.

## 2021-06-19 ENCOUNTER — Telehealth (HOSPITAL_COMMUNITY): Payer: Self-pay | Admitting: *Deleted

## 2021-06-19 MED ORDER — FLECAINIDE ACETATE 50 MG PO TABS: 50.0000 mg | ORAL_TABLET | Freq: Two times a day (BID) | ORAL | 3 refills | Status: DC

## 2021-06-19 NOTE — Telephone Encounter (Signed)
Insurance denied coverage of multaq. Appeal denied. Per Jorja Loa PA will start Flecainide 50mg  twice a day and follow up in 1 week with EKG. Pt verbalized understanding.

## 2021-06-20 ENCOUNTER — Encounter (HOSPITAL_COMMUNITY): Payer: Managed Care, Other (non HMO) | Admitting: Physician Assistant

## 2021-06-20 ENCOUNTER — Ambulatory Visit (HOSPITAL_COMMUNITY): Payer: Managed Care, Other (non HMO) | Admitting: Physician Assistant

## 2021-06-21 ENCOUNTER — Telehealth: Payer: Self-pay | Admitting: Student

## 2021-06-21 ENCOUNTER — Telehealth: Payer: Self-pay | Admitting: Internal Medicine

## 2021-06-21 NOTE — Telephone Encounter (Signed)
Talked with patient this morning. Pt has not actually started flecainide yet. Encouraged pt to start flecainide to help reduce afib burden. Reassured pt his symptoms are consistent with characteristics of afib. PRN metoprolol use reviewed with patient. Pt questioning if he needs an echo today - reassured pt and will address at his upcoming appt regarding echo scheduling/need. Pt less anxious by end of phone call and will start flecainide tonight. Pt will call if issues arise prior to follow up.

## 2021-06-21 NOTE — Telephone Encounter (Signed)
   Patient called Answering Service this morning with concerns of not feeling well. Called and spoke with patient. He states he had a bad night. He states around 10pm he went back into presumed atrial fibrillation. He had tachypalpitations with rates in the 130s. He had some mild chest/back discomfort. This lasted for about 3 hours and then resolved. He is no longer having chest/back discomfort. He also reports feeling very fatigued and states he did not sleep well. He states his BP is elevated this morning in the 120/90s. He has not taken his morning Lisinopril yet. He was seen in the [facility] on 06/15/2021 and plan was to start Multaq. However, insurance will not cover this so he was started on Flecainide instead. He has not picked this up yet. Recommended patient go ahead and start Flecainide and go ahead and take Lisinopril. Also recommend he call [facility] when they open this morning to schedule a follow-up visit (they wanted to see him in 1 week anyway).   While on the phone, he said he had a brief episode of sharp chest pain that last for 1-2 seconds and then went away. Tried to reassure patient that this is not consistent with cardiac pain. However, dicussed that if he has more prolonged chest pain, acute shortness of breath, near syncope etc., he should be seen in the ED.  Patient states he really does not feel well and does not feel like he can go to work today. He states he thinks he needs an Echo. Again recommended calling [facility] to see if they could potentially get him seen today. If they are unable to see him and he is feeling that bed, recommended going to the [facility] or [facility] ED.  Corrin Parker, PA-C 06/21/2021 7:40 AM

## 2021-06-21 NOTE — Telephone Encounter (Signed)
Patient c/o Palpitations:  High priority if patient c/o lightheadedness, shortness of breath, or chest pain  How long have you had palpitations/irregular HR/ Afib? Comes and goes, has been happening frequent in the past week. Are you having the symptoms now? No   Are you currently experiencing lightheadedness, SOB or CP? No, just feeling tired.   Do you have a history of afib (atrial fibrillation) or irregular heart rhythm? yes  Have you checked your BP or HR? (document readings if available): 630am 129/93 HR 70?  Are you experiencing any other symptoms? No        Patient states he is having A-fib he wants to know the medication he can take. He also wants to know if he should have an EKG or be on a monitor.  Patient states last night his BP and HR was high. 150?/110, at one point HR was at 130

## 2021-06-22 NOTE — Telephone Encounter (Signed)
Pt feeling better since starting flecainide yesterday evening. He slept well. Pt will call for follow up EKG next week.

## 2021-06-29 ENCOUNTER — Ambulatory Visit (HOSPITAL_COMMUNITY)
Admission: RE | Admit: 2021-06-29 | Discharge: 2021-06-29 | Disposition: A | Discharge status: Home / Self Care | Payer: Managed Care, Other (non HMO) | Source: Ambulatory Visit | Attending: Physician Assistant | Admitting: Physician Assistant

## 2021-06-29 ENCOUNTER — Other Ambulatory Visit: Payer: Self-pay

## 2021-06-29 ENCOUNTER — Encounter (HOSPITAL_COMMUNITY): Payer: Self-pay | Admitting: Physician Assistant

## 2021-06-29 VITALS — BP 130/88 | HR 78 | Ht 72.0 in | Wt 286.4 lb

## 2021-06-29 DIAGNOSIS — R0683 Snoring: Secondary | ICD-10-CM | POA: Diagnosis not present

## 2021-06-29 DIAGNOSIS — G4730 Sleep apnea, unspecified: Secondary | ICD-10-CM | POA: Diagnosis not present

## 2021-06-29 DIAGNOSIS — I480 Paroxysmal atrial fibrillation: Secondary | ICD-10-CM | POA: Diagnosis present

## 2021-06-29 DIAGNOSIS — Z79899 Other long term (current) drug therapy: Secondary | ICD-10-CM | POA: Insufficient documentation

## 2021-06-29 DIAGNOSIS — F419 Anxiety disorder, unspecified: Secondary | ICD-10-CM | POA: Diagnosis not present

## 2021-06-29 DIAGNOSIS — Z7182 Exercise counseling: Secondary | ICD-10-CM | POA: Insufficient documentation

## 2021-06-29 DIAGNOSIS — F1091 Alcohol use, unspecified, in remission: Secondary | ICD-10-CM | POA: Insufficient documentation

## 2021-06-29 DIAGNOSIS — E669 Obesity, unspecified: Secondary | ICD-10-CM | POA: Diagnosis not present

## 2021-06-29 DIAGNOSIS — F411 Generalized anxiety disorder: Secondary | ICD-10-CM

## 2021-06-29 DIAGNOSIS — I10 Essential (primary) hypertension: Secondary | ICD-10-CM | POA: Diagnosis not present

## 2021-06-29 DIAGNOSIS — Z6838 Body mass index (BMI) 38.0-38.9, adult: Secondary | ICD-10-CM | POA: Diagnosis not present

## 2021-06-29 DIAGNOSIS — Z8249 Family history of ischemic heart disease and other diseases of the circulatory system: Secondary | ICD-10-CM | POA: Diagnosis not present

## 2021-06-29 DIAGNOSIS — R400 Somnolence: Secondary | ICD-10-CM | POA: Insufficient documentation

## 2021-06-29 MED ORDER — FLECAINIDE ACETATE 100 MG PO TABS: 100.0000 mg | ORAL_TABLET | Freq: Two times a day (BID) | ORAL | 3 refills | Status: DC

## 2021-06-29 NOTE — Patient Instructions (Signed)
Increase flecainide to 100mg twice a day 

## 2021-06-29 NOTE — Progress Notes (Signed)
Primary Care Physician: Darrol Jump, NP Primary Cardiologist: Dr Claiborne Billings Primary Electrophysiologist: none Referring Physician: [facility]   Joshua [place] is a 50 y.o. male with a history of HTN and atrial fibrillation who presents for follow up in the [facility].  The patient was initially diagnosed with atrial fibrillation 06/07/21 after presenting to his PCP with sinus congestion. ECG showed rapid afib and he was sent to the ED. He was discharged in rate controlled afib. He has a long history of PACs and palpitations. Patient has a CHADS2VASC score of 1. He converted to SR after leaving the ED. He drinks alcohol occasionally but does report snoring and daytime somnolence.   On follow up today, patient reports that he palpitations have improved but not resolved since starting flecainide. He does report significant anxiety about his health as well as generalized anxiety. He has a sleep study scheduled for 07/18/20.  Today, he denies symptoms of chest pain, shortness of breath, orthopnea, PND, lower extremity edema, dizziness, presyncope, syncope, bleeding, or neurologic sequela. The patient is tolerating medications without difficulties and is otherwise without complaint today.    Atrial Fibrillation Risk Factors:  he does have symptoms or diagnosis of sleep apnea. he is agreeable for sleep study.  he does not have a history of rheumatic fever. he does have a history of alcohol use. The patient does have a history of early familial atrial fibrillation or other arrhythmias. Mother, father, sister have afib.  he has a BMI of Body mass index is 38.84 kg/m.Marland Kitchen Filed Weights   06/29/21 1514  Weight: 129.9 kg     Family History  Problem Relation Age of Onset   Lung cancer Father      Atrial Fibrillation Management history:  Previous antiarrhythmic drugs: flecainide  Previous cardioversions: none Previous ablations: none CHADS2VASC score:  1 Anticoagulation history: none   Past Medical History:  Diagnosis Date   Anxiety    Hypertension    Past Surgical History:  Procedure Laterality Date   CHOLECYSTECTOMY N/A 08/14/2017   Procedure: LAPAROSCOPIC CHOLECYSTECTOMY WITH INTRAOPERATIVE CHOLANGIOGRAM;  Surgeon: Donnie Mesa, MD;  Location: [place];  Service: General;  Laterality: N/A;   LEFT HEART CATH AND CORONARY ANGIOGRAPHY N/A 05/26/2018   Procedure: LEFT HEART CATH AND CORONARY ANGIOGRAPHY;  Surgeon: Troy Sine, MD;  Location: [place] CV LAB;  Service: Cardiovascular;  Laterality: N/A;   SHOULDER SURGERY      Current Outpatient Medications  Medication Sig Dispense Refill   ALPRAZolam (XANAX) 0.25 MG tablet Take 0.25 mg by mouth daily as needed.     cetirizine (ZYRTEC) 10 MG tablet Take 10 mg by mouth daily.     fluticasone (FLONASE) 50 MCG/ACT nasal spray Place 1 spray into both nostrils 2 (two) times daily.     lisinopril (ZESTRIL) 10 MG tablet TAKE 1/2 TABLET BY MOUTH DAILY 45 tablet 3   metoprolol succinate (TOPROL-XL) 50 MG 24 hr tablet Take 1 tablet (50 mg total) by mouth daily. 30 tablet 2   metoprolol tartrate (LOPRESSOR) 25 MG tablet Take 1 tablet (25 mg total) by mouth daily as needed for up to 15 doses (Take 25 mg once daily if your heart rate is consistently above 120 bpm.). 15 tablet 0   nitroGLYCERIN (NITROSTAT) 0.4 MG SL tablet Place 1 tablet (0.4 mg total) under the tongue every 5 (five) minutes as needed for chest pain. 25 tablet 4   flecainide (TAMBOCOR) 100 MG tablet Take 1  tablet (100 mg total) by mouth 2 (two) times daily. 60 tablet 3   No current facility-administered medications for this encounter.    Allergies  Allergen Reactions   Trolamine Salicylate Rash    Social History   Socioeconomic History   Marital status: Divorced    Spouse name: Not on file   Number of children: Not on file   Years of education: Not on file   Highest education level: Not on file  Occupational  History   Not on file  Tobacco Use   Smoking status: Never   Smokeless tobacco: Never  Vaping Use   Vaping Use: Never used  Substance and Sexual Activity   Alcohol use: Not Currently    Alcohol/week: 6.0 - 8.0 standard drinks    Types: 6 - 8 Cans of beer per week    Comment: no drink since 06/23/21   Drug use: No   Sexual activity: Not on file  Other Topics Concern   Not on file  Social History Narrative   Not on file   Social Determinants of Health   Financial Resource Strain: Not on file  Food Insecurity: Not on file  Transportation Needs: Not on file  Physical Activity: Not on file  Stress: Not on file  Social Connections: Not on file  Intimate Partner Violence: Not on file     ROS- All systems are reviewed and negative except as per the HPI above.  Physical Exam: Vitals:   06/29/21 1514  BP: 130/88  Pulse: 78  Weight: 129.9 kg  Height: 6' (1.829 m)    GEN- The patient is a well appearing obese male, alert and oriented x 3 today.   HEENT-head normocephalic, atraumatic, sclera clear, conjunctiva pink, hearing intact, trachea midline. Lungs- Clear to ausculation bilaterally, normal work of breathing Heart- Regular rate and rhythm, no murmurs, rubs or gallops  GI- soft, NT, ND, + BS Extremities- no clubbing, cyanosis, or edema MS- no significant deformity or atrophy Skin- no rash or lesion Psych- euthymic mood, full affect Neuro- strength and sensation are intact   Wt Readings from Last 3 Encounters:  06/29/21 129.9 kg  06/15/21 131.9 kg  06/07/21 127 kg    EKG today demonstrates  SR, NST Vent. rate 78 BPM PR interval 192 ms QRS duration 88 ms QT/QTcB 384/437 ms  Echo 09/21/15 demonstrated  - Left ventricle: The cavity size was normal. Wall thickness was    increased in a pattern of mild LVH. Systolic function was normal.    The estimated ejection fraction was in the range of 60% to 65%.    Wall motion was normal; there were no regional wall motion     abnormalities. Features are consistent with a pseudonormal left    ventricular filling pattern, with concomitant abnormal relaxation    and increased filling pressure (grade 2 diastolic dysfunction).  - Aortic valve: There was no stenosis.  - Aorta: Borderline dilated aortic root. Aortic root dimension: 37    mm (ED).  - Mitral valve: There was trivial regurgitation.  - Right ventricle: The cavity size was mildly dilated. Systolic    function was normal.  - Pulmonary arteries: No complete TR doppler jet so unable to    estimate PA systolic pressure.  - Inferior vena cava: The vessel was normal in size. The    respirophasic diameter changes were in the normal range (= 50%),    consistent with normal central venous pressure.   Impressions:   - Normal  LV size with mild LV hypertrophy. EF 60-65%. Moderate    diastolic dysfunction. Mildly dilated RV with normal systolic    function. No significant valvular abnormalities.   Epic records are reviewed at length today  CHA2DS2-VASc Score = 1  The patient's score is based upon: CHF History: 0 HTN History: 1 Diabetes History: 0 Stroke History: 0 Vascular Disease History: 0 Age Score: 0 Gender Score: 0      ASSESSMENT AND PLAN: 1. Paroxysmal Atrial Fibrillation (ICD10:  I48.0) The patient's CHA2DS2-VASc score is 1, indicating a 0.6% annual risk of stroke.   Multaq was cost prohibitive. Started on flecainide. Will increase flecainide to 100 mg BID. If he does well on this dose, will plan for treadmill stress test. Check echocardiogram Continue Toprol 50 mg daily with Lopressor 25 mg q 6 hours as needed for heart racing. No anticoagulation indicated at this time with low CV score.  2. Obesity Body mass index is 38.84 kg/m. Lifestyle modification was discussed and encouraged including regular physical activity and weight reduction.  3. Snoring/daytime somnolence  Sleep study scheduled for 07/18/21.  4. HTN Stable, no changes  today.  5. Anxiety Patient reports significant anxiety regarding his afib and health in general. ? If some of his palpitation symptoms are more related to anxiety then his afib. He is agreeable to referral to Dr Bosie Clos, will refer.    Follow up in the AF clinic next week for ECG after flecainide start.    Jorja Loa PA-C Afib [facility] [address] [place], [place] [address] [phone] 06/29/2021 4:23 PM

## 2021-07-02 ENCOUNTER — Telehealth: Payer: Self-pay | Admitting: Student in an Organized Health Care Education/Training Program

## 2021-07-02 NOTE — Telephone Encounter (Signed)
Paged by operator that wife reports AF and wanted to discuss.  Patient reporting back pain, nausea, bloating. Had large dinner of japanese food. HR 130s. Currently HR 125. Usually can feel if he is in AF as he "feels his heart beating in his stomach, also feels irregular HR when he is in it." Right now feels like he is in regular/fast rhythm. Took toprol XL 50 mg at 1730 (once daily). Took metoprolol tartrate 25 mg at 2030. Took flecainide 75 mg at 1830. Recently moved from flecainide 50 mg bid to 100 mg bid but he was somewhat lightheaded on the 50 mg bid so was going to gradually increase taking it. Not on OAC. C2V (1) for HTN. BP 134/97.   Still having some nausea which is usually when he is in AF. Anxious, some lightheadedness. Mild pain in lower left back, not very severe. No significant persistent CP/pressure. No emesis. Took zofran for nausea. Similar to prior AF episodes. Asked him to take additional metop tartrate 25 mg x1 if HR >120 before bed. If sx persistent despite rate controlled AF then there may be something else driving sx. Had normal coronary angio in 2019 and other than obesity/HTN not significant CAD RF. Explained that if he has any CP or just persistent sx he should come to ED for further evaluation. He felt this was very similar to prior AF episodes and comfortable trying to rate control at home. Will message [place] if persistent sx tomorrow and will go to ED if worsening sx or presyncope, chest pain, chest pressure, SOB or emesis.

## 2021-07-03 ENCOUNTER — Other Ambulatory Visit: Payer: Self-pay | Admitting: Physician Assistant

## 2021-07-05 ENCOUNTER — Ambulatory Visit (HOSPITAL_COMMUNITY)
Admission: RE | Admit: 2021-07-05 | Discharge: 2021-07-05 | Disposition: A | Discharge status: Home / Self Care | Payer: Managed Care, Other (non HMO) | Source: Ambulatory Visit | Attending: Nurse Practitioner | Admitting: Nurse Practitioner

## 2021-07-05 ENCOUNTER — Other Ambulatory Visit: Payer: Self-pay

## 2021-07-05 ENCOUNTER — Encounter (HOSPITAL_COMMUNITY): Payer: Self-pay | Admitting: Nurse Practitioner

## 2021-07-05 DIAGNOSIS — I480 Paroxysmal atrial fibrillation: Secondary | ICD-10-CM | POA: Diagnosis present

## 2021-07-05 DIAGNOSIS — I10 Essential (primary) hypertension: Secondary | ICD-10-CM | POA: Insufficient documentation

## 2021-07-05 DIAGNOSIS — Z79899 Other long term (current) drug therapy: Secondary | ICD-10-CM | POA: Insufficient documentation

## 2021-07-05 DIAGNOSIS — I4891 Unspecified atrial fibrillation: Secondary | ICD-10-CM | POA: Diagnosis not present

## 2021-07-05 NOTE — Progress Notes (Signed)
Pt in  for EKG on flecainide 100 mg bid. . EKG shows SR, intervals are wnl, but  Qtc is 490 ms.  Pt called yesterday as he had an episode of afib that lasted around 3 hours. He took a xanax and metoprolol tartrate.  He has some non specific complaints re symptoms and afib/flecainide. He is getting his echo tomorrow. I will bring back to talk to [facility], [facility],  in 2 weeks before deciding if he will stay on flecainide.

## 2021-07-06 ENCOUNTER — Ambulatory Visit (HOSPITAL_BASED_OUTPATIENT_CLINIC_OR_DEPARTMENT_OTHER)
Admission: RE | Admit: 2021-07-06 | Discharge: 2021-07-06 | Disposition: A | Discharge status: Home / Self Care | Payer: Managed Care, Other (non HMO) | Source: Ambulatory Visit | Attending: Physician Assistant | Admitting: Physician Assistant

## 2021-07-06 DIAGNOSIS — I480 Paroxysmal atrial fibrillation: Secondary | ICD-10-CM

## 2021-07-06 LAB — ECHOCARDIOGRAM COMPLETE
Area-P 1/2: 3.99 cm2
Calc EF: 61.8 %
S' Lateral: 3.3 cm
Single Plane A2C EF: 66 %
Single Plane A4C EF: 57.7 %

## 2021-07-06 NOTE — Progress Notes (Signed)
°  Echocardiogram 2D Echocardiogram has been performed.  Augustine Radar 07/06/2021, 10:48 AM

## 2021-07-08 ENCOUNTER — Emergency Department (HOSPITAL_BASED_OUTPATIENT_CLINIC_OR_DEPARTMENT_OTHER)
Admission: EM | Admit: 2021-07-08 | Discharge: 2021-07-08 | Disposition: A | Discharge status: Home / Self Care | Payer: Managed Care, Other (non HMO) | Attending: Emergency Medicine | Admitting: Emergency Medicine

## 2021-07-08 ENCOUNTER — Encounter (HOSPITAL_BASED_OUTPATIENT_CLINIC_OR_DEPARTMENT_OTHER): Payer: Self-pay

## 2021-07-08 ENCOUNTER — Telehealth: Payer: Self-pay | Admitting: Physician Assistant

## 2021-07-08 ENCOUNTER — Emergency Department (HOSPITAL_BASED_OUTPATIENT_CLINIC_OR_DEPARTMENT_OTHER): Payer: Managed Care, Other (non HMO) | Admitting: Radiology

## 2021-07-08 DIAGNOSIS — R079 Chest pain, unspecified: Secondary | ICD-10-CM | POA: Diagnosis not present

## 2021-07-08 DIAGNOSIS — I10 Essential (primary) hypertension: Secondary | ICD-10-CM | POA: Insufficient documentation

## 2021-07-08 DIAGNOSIS — Z79899 Other long term (current) drug therapy: Secondary | ICD-10-CM | POA: Insufficient documentation

## 2021-07-08 DIAGNOSIS — Z20822 Contact with and (suspected) exposure to covid-19: Secondary | ICD-10-CM | POA: Diagnosis not present

## 2021-07-08 DIAGNOSIS — R002 Palpitations: Secondary | ICD-10-CM

## 2021-07-08 LAB — CBC
HCT: 48.5 % (ref 39.0–52.0)
Hemoglobin: 17.2 g/dL — ABNORMAL HIGH (ref 13.0–17.0)
MCH: 32.1 pg (ref 26.0–34.0)
MCHC: 35.5 g/dL (ref 30.0–36.0)
MCV: 90.5 fL (ref 80.0–100.0)
Platelets: 255 10*3/uL (ref 150–400)
RBC: 5.36 MIL/uL (ref 4.22–5.81)
RDW: 12.5 % (ref 11.5–15.5)
WBC: 8.1 10*3/uL (ref 4.0–10.5)
nRBC: 0 % (ref 0.0–0.2)

## 2021-07-08 LAB — BASIC METABOLIC PANEL
Anion gap: 10 (ref 5–15)
BUN: 12 mg/dL (ref 6–20)
CO2: 24 mmol/L (ref 22–32)
Calcium: 10 mg/dL (ref 8.9–10.3)
Chloride: 105 mmol/L (ref 98–111)
Creatinine, Ser: 1.05 mg/dL (ref 0.61–1.24)
GFR, Estimated: >60 mL/min (ref >60)
Glucose, Bld: 107 mg/dL — ABNORMAL HIGH (ref 70–99)
Potassium: 4 mmol/L (ref 3.5–5.1)
Sodium: 139 mmol/L (ref 135–145)

## 2021-07-08 LAB — TSH: TSH: 1.603 u[IU]/mL (ref 0.350–4.500)

## 2021-07-08 LAB — TROPONIN I (HIGH SENSITIVITY)
Troponin I (High Sensitivity): <2 ng/L (ref <18)
Troponin I (High Sensitivity): <2 ng/L (ref <18)

## 2021-07-08 LAB — MAGNESIUM: Magnesium: 2.1 mg/dL (ref 1.7–2.4)

## 2021-07-08 NOTE — ED Provider Notes (Signed)
°  Physical Exam  BP (!) 132/92    Pulse 74    Temp 97.7 F (36.5 C)    Resp 18    Ht 6' (1.829 m)    Wt 127 kg    SpO2 97%    BMI 37.97 kg/m   Physical Exam Constitutional:      General: He is not in acute distress.    Appearance: Normal appearance.  HENT:     Head: Normocephalic and atraumatic.     Nose: No congestion or rhinorrhea.  Eyes:     General:        Right eye: No discharge.        Left eye: No discharge.     Extraocular Movements: Extraocular movements intact.     Pupils: Pupils are equal, round, and reactive to light.  Cardiovascular:     Rate and Rhythm: Normal rate and regular rhythm.     Heart sounds: No murmur heard. Pulmonary:     Effort: No respiratory distress.     Breath sounds: No wheezing or rales.  Abdominal:     General: There is no distension.     Tenderness: There is no abdominal tenderness.  Musculoskeletal:        General: Normal range of motion.     Cervical back: Normal range of motion.  Skin:    General: Skin is warm and dry.  Neurological:     General: No focal deficit present.     Mental Status: He is alert.    ED Course/Procedures   Clinical Course as of 07/08/21 1732  Sat Jul 08, 2021  1517 Sharp CP, in NSR here, on flecanide and metoprolol, PERC negative if neg delta trop home with cards [MK]    Clinical Course User Index [MK] Lilburn Straw, Wyn Forster, MD    Procedures  MDM  Patient received in handoff.  Sharp chest pain with palpitations.  Pending delta Trope.  Plan at signout was to discharge with EP follow-up after troponin.  Troponin was normal and patient was discharged with EP follow-up.       Glendora Score, MD 07/08/21 [phone]

## 2021-07-08 NOTE — ED Notes (Signed)
S1 S2 heard, Cap refill good. Calm dispostion, Pt denies Pain

## 2021-07-08 NOTE — ED Provider Notes (Addendum)
[facility] EM[facility]PT Provider Note   CSN: [id number] Arrival date & time: 07/08/21  1256     History Chief Complaint  Patient presents with   Irregular Heart Beat    Joshua Simpson is a 50 y.o. male.  HPI  50 year old male presenting to the emergency department with a chief complaint of palpitations.  The patient states that he feels that his atrial fibrillation is acting up.  He currently takes metoprolol and flecainide for his atrial fibrillation.  He is not on anticoagulation.  He has recently followed up outpatient with electrophysiology for his A. fib in the past month.  Since last night, he felt lightheaded and felt like he had an irregular heartbeat.  Usually his symptoms subside within 2 to 3 hours but symptoms had persisted so he presented to the emergency department today.  He endorsed mild chest tightness associated with his palpitations but symptoms have since resolved.  Past Medical History:  Diagnosis Date   Anxiety    Hypertension     Patient Active Problem List   Diagnosis Date Noted   Paroxysmal atrial fibrillation ([place]) 06/15/2021   Exertional chest pain    Exertional dyspnea    Chronic cholecystitis with calculus 08/14/2017   Palpitations 08/15/2015   Obesity (BMI 30-39.9) 08/15/2015   ABDOMINAL PAIN, LEFT UPPER QUADRANT 04/25/2010   Anxiety state 04/18/2010   Essential hypertension 04/18/2010   PAIN IN JOINT, ANKLE AND FOOT 04/18/2010   EPIGASTRIC PAIN 04/18/2010    Past Surgical History:  Procedure Laterality Date   CHOLECYSTECTOMY N/A 08/14/2017   Procedure: LAPAROSCOPIC CHOLECYSTECTOMY WITH INTRAOPERATIVE CHOLANGIOGRAM;  Surgeon: Donnie Mesa, MD;  Location: [facility];  Service: General;  Laterality: N/A;   LEFT HEART CATH AND CORONARY ANGIOGRAPHY N/A 05/26/2018   Procedure: LEFT HEART CATH AND CORONARY ANGIOGRAPHY;  Surgeon: Troy Sine, MD;  Location: [facility];  Service: Cardiovascular;  Laterality: N/A;   SHOULDER  SURGERY         Family History  Problem Relation Age of Onset   Lung cancer Father     Social History   Tobacco Use   Smoking status: Never   Smokeless tobacco: Never  Vaping Use   Vaping Use: Never used  Substance Use Topics   Alcohol use: Not Currently    Alcohol/week: 6.0 - 8.0 standard drinks    Types: 6 - 8 Cans of beer per week    Comment: no drink since 06/23/21   Drug use: No    Home Medications Prior to Admission medications   Medication Sig Start Date End Date Taking? Authorizing Provider  ALPRAZolam (XANAX) 0.25 MG tablet Take 0.25 mg by mouth daily as needed. 06/07/21   [provider]  cetirizine (ZYRTEC) 10 MG tablet Take 10 mg by mouth daily.    [provider]  flecainide (TAMBOCOR) 100 MG tablet Take 1 tablet (100 mg total) by mouth 2 (two) times daily. 06/29/21   Fenton, Clint R, PA  fluticasone (FLONASE) 50 MCG/ACT nasal spray Place 1 spray into both nostrils 2 (two) times daily. 06/07/21   [provider]  lisinopril (ZESTRIL) 10 MG tablet TAKE 1/2 TABLET BY MOUTH DAILY 06/12/21   Troy Sine, MD  metoprolol succinate (TOPROL-XL) 50 MG 24 hr tablet TAKE 1 TABLET BY MOUTH DAILY 07/03/21   Evans Lance, MD  metoprolol tartrate (LOPRESSOR) 25 MG tablet Take 1 tablet (25 mg total) by mouth daily as needed for up to 15 doses (Take 25  mg once daily if your heart rate is consistently above 120 bpm.). 06/07/21   Luna Fuse, MD  nitroGLYCERIN (NITROSTAT) 0.4 MG SL tablet Place 1 tablet (0.4 mg total) under the tongue every 5 (five) minutes as needed for chest pain. 05/15/18 06/29/21  Almyra Deforest, PA    Allergies    Trolamine salicylate  Review of Systems   Review of Systems  Respiratory:  Positive for chest tightness.   Cardiovascular:  Positive for palpitations.  All other systems reviewed and are negative.  Physical Exam Updated Vital Signs BP (!) 132/92    Pulse 74    Temp 97.7 F (36.5 C)    Resp 18    Ht 6' (1.829  m)    Wt 127 kg    SpO2 97%    BMI 37.97 kg/m   Physical Exam Vitals and nursing note reviewed.  Constitutional:      General: He is not in acute distress.    Appearance: He is well-developed.  HENT:     Head: Normocephalic and atraumatic.  Eyes:     Conjunctiva/sclera: Conjunctivae normal.     Pupils: Pupils are equal, round, and reactive to light.  Cardiovascular:     Rate and Rhythm: Normal rate and regular rhythm.     Heart sounds: No murmur heard. Pulmonary:     Effort: Pulmonary effort is normal. No respiratory distress.     Breath sounds: Normal breath sounds.  Abdominal:     General: There is no distension.     Palpations: Abdomen is soft.     Tenderness: There is no abdominal tenderness. There is no guarding.  Musculoskeletal:        General: No swelling, deformity or signs of injury.     Cervical back: Neck supple.  Skin:    General: Skin is warm and dry.     Capillary Refill: Capillary refill takes less than 2 seconds.     Findings: No lesion or rash.  Neurological:     General: No focal deficit present.     Mental Status: He is alert. Mental status is at baseline.  Psychiatric:        Mood and Affect: Mood normal.    ED Results / Procedures / Treatments   Labs (all labs ordered are listed, but only abnormal results are displayed) Labs Reviewed  BASIC METABOLIC PANEL - Abnormal; Notable for the following components:      Result Value   Glucose, Bld 107 (*)    All other components within normal limits  CBC - Abnormal; Notable for the following components:   Hemoglobin 17.2 (*)    All other components within normal limits  MAGNESIUM  TSH  TROPONIN I (HIGH SENSITIVITY)  TROPONIN I (HIGH SENSITIVITY)    EKG EKG Interpretation  Date/Time:  Saturday July 08 2021 13:06:28 EST Ventricular Rate:  80 PR Interval:  202 QRS Duration: 88 QT Interval:  388 QTC Calculation: 447 R Axis:   125 Text Interpretation: Normal sinus rhythm Left posterior  fascicular block Inferior infarct , age undetermined Cannot rule out Anterior infarct , age undetermined Abnormal ECG Confirmed by Regan Lemming ([id number]) on 07/08/2021 1:40:35 PM  Radiology DG Chest 2 View  Result Date: 07/08/2021 CLINICAL DATA:  Chest pain, irregular heart beat EXAM: CHEST - 2 VIEW COMPARISON:  06/07/2021 FINDINGS: The heart size and mediastinal contours are within normal limits. Both lungs are clear. The visualized skeletal structures are unremarkable. IMPRESSION: No active cardiopulmonary disease. Electronically Signed  By: Osvaldo Shipper M.D.   On: 07/08/2021 13:59    Procedures Procedures   Medications Ordered in ED Medications - No data to display  ED Course  I have reviewed the triage vital signs and the nursing notes.  Pertinent labs & imaging results that were available during my care of the patient were reviewed by me and considered in my medical decision making (see chart for details).  Clinical Course as of 07/09/21 2038  Sat Jul 08, 2021  1517 [facility] CP, in NSR here, on flecanide and metoprolol, PERC negative if neg delta trop home with cards [MK]    Clinical Course User Index [MK] Kommor, Madison, MD   MDM Rules/Calculators/A&P                          50 year old male presenting to the emergency department with a chief complaint of palpitations.  The patient states that he feels that his atrial fibrillation is acting up.  He currently takes metoprolol and flecainide for his atrial fibrillation.  He is not on anticoagulation.  He has recently followed up outpatient with electrophysiology for his A. fib in the past month.  Since last night, he felt lightheaded and felt like he had an irregular heartbeat.  Usually his symptoms subside within 2 to 3 hours but symptoms had persisted so he presented to the emergency department today.  He endorsed mild sharp chest pain associated with his palpitations but symptoms have since resolved.  On arrival, the patient was  afebrile, hemodynamically stable, not tachycardic or tachypneic, saturating well on room air.  The patient's initial EKG revealed normal sinus rhythm, ventricular rate 80 0, no evidence of atrial fibrillation.  A chest x-ray was performed revealed no acute cardiopulmonary disease.  The patient is PERC negative.  Low suspicion for acute PE at this time.  He is not hypoxic or tachycardic.  He is not tachypneic.  He denies any chest discomfort currently.  He is not currently in atrial fibrillation and is currently well controlled on his outpatient medication regimen.  Over the course of 4 hours, the patient had no episodes of atrial fibrillation on cardiac telemetry.  Occasional PVCs were noted.  Delta troponins were collected and resulted normal.  A CBC was without a leukocytosis, anemia or platelet abnormality, magnesium was normal and a TSH was also normal.  At this time, low suspicion for acute intrathoracic emergency at this time.  Low suspicion for pericarditis/myocarditis, ACS, PE, no evidence for pneumothorax or pneumonia, no evidence for uncontrolled atrial fibrillation at this time over the course of 4 hours in the emergency department on EKG and on cardiac telemetry.   Of note, the patient just had a reassuring echocardiogram in the past 3 days with an LVEF of 60 to 65%, normal left ventricular function, no wall motion abnormalities, mild diastolic dysfunction present, RV function normal, no evidence of mitral valve regurgitation or stenosis, no evidence of aortic valve stenosis or regurgitation.  His last cardiac catheterization was in 2019 which revealed no coronary artery obstructive disease.  Overall feel that the patient is stable for discharge at this time with a plan for outpatient follow-up with cardiology.  The patient has been appropriately medically screened and/or stabilized in the ED. I have low suspicion for any other emergent medical condition which would require further screening,  evaluation or treatment in the ED or require inpatient management.    Final Clinical Impression(s) / ED Diagnoses Final  diagnoses:  Palpitations  Chest pain, unspecified type    Rx / DC Orders ED Discharge Orders     None        Regan Lemming, MD 07/09/21 2038    Regan Lemming, MD 07/09/21 2039    Regan Lemming, MD 07/09/21 2040

## 2021-07-08 NOTE — Telephone Encounter (Signed)
° °  He has been having problems w/ recurrent episodes of Afib.  He has some nausea, feels bloated, does not feel like eating.  Having some chest pain w/ the Afib.  Having trouble sleeping because of the Afib episodes.   137/79, 69 but irregular. HR range 40s - 130s.  He is compliant with his Toprol XL 50 mg qd. Also has metoprolol 25 mg prn, has taken 1 or 2 of them, does not take frequently.   Advised him, he can either take the metop prn 2 x day, but not at bedtime because night HRs are lower, or go to the ER.    He will decide.   Theodore Demark, PA-C 07/08/2021 11:46 AM

## 2021-07-08 NOTE — ED Triage Notes (Signed)
Pt states irregular heart beat and dizziness since 2200 last night. Pt states this usually subsides on its own in 2-3 hours but has not. Pt also c/o left chest pain and nausea.

## 2021-07-18 ENCOUNTER — Telehealth: Payer: Self-pay | Admitting: Cardiovascular Disease

## 2021-07-18 ENCOUNTER — Other Ambulatory Visit: Payer: Self-pay

## 2021-07-18 ENCOUNTER — Ambulatory Visit (HOSPITAL_BASED_OUTPATIENT_CLINIC_OR_DEPARTMENT_OTHER): Payer: Managed Care, Other (non HMO) | Attending: Physician Assistant | Admitting: Cardiovascular Disease

## 2021-07-18 DIAGNOSIS — G4733 Obstructive sleep apnea (adult) (pediatric): Secondary | ICD-10-CM | POA: Diagnosis present

## 2021-07-18 DIAGNOSIS — I10 Essential (primary) hypertension: Secondary | ICD-10-CM | POA: Diagnosis not present

## 2021-07-18 DIAGNOSIS — I480 Paroxysmal atrial fibrillation: Secondary | ICD-10-CM | POA: Diagnosis not present

## 2021-07-18 DIAGNOSIS — R400 Somnolence: Secondary | ICD-10-CM

## 2021-07-18 DIAGNOSIS — G4736 Sleep related hypoventilation in conditions classified elsewhere: Secondary | ICD-10-CM | POA: Diagnosis not present

## 2021-07-18 NOTE — Telephone Encounter (Signed)
Spoke with patient who reports chest discomfort on and off since Thanksgiving. He has been to the ER twice since then and was told, "Nothing is wrong." He describes chest discomfort as pressure and has weakness. He states he doesn't feel dizzy if he takes flecainide and metoprolol not at the same time. He has an appointment with the afib clinic tomorrow. When asked if he uses NTG when he feels chest discomfort, he said he was afraid to use it. I explained the medication to him and how to use it. He denied chest pain, pressure, or tightness at this time. I recommended that he go to the ER if he takes the NTG, as prescribed, without any relief form chest pressure, tightness, or pain. He also stated he lost weight since Thanksgiving because he has been afraid to eat. Nutrition discussed with him regarding low salt and low fat diet. He would like to meet with a dietician. I let him know I would forward this message to Dr. Tresa Endo.

## 2021-07-18 NOTE — Telephone Encounter (Signed)
Pt c/o of Chest Pain: STAT if CP now or developed within 24 hours  1. Are you having CP right now? Pt stated " just a little"   2. Are you experiencing any other symptoms (ex. SOB, nausea, vomiting, sweating)? Dizzy,  a little SOB (he is just getting over a cold)   Pt has taken about 3 doses of Cortisone for he cold.  He also feels like he is in Afib  3. How long have you been experiencing CP? Pt stated this has been going on ever since thanksgiving.  He stated he has been to the ER 2 time since thanksgiving.    4. Is your CP continuous or coming and going? They come and go.    5. Have you taken Nitroglycerin? No but he does have it just in case.    Best number  [phone] ?

## 2021-07-18 NOTE — Telephone Encounter (Signed)
Called patient, LVM to call back to discuss.  Left call back number.   

## 2021-07-19 ENCOUNTER — Ambulatory Visit (HOSPITAL_COMMUNITY)
Admission: RE | Admit: 2021-07-19 | Discharge: 2021-07-19 | Disposition: A | Discharge status: Home / Self Care | Payer: Managed Care, Other (non HMO) | Source: Ambulatory Visit | Attending: Physician Assistant | Admitting: Physician Assistant

## 2021-07-19 VITALS — BP 126/90 | HR 74 | Ht 72.0 in | Wt 283.2 lb

## 2021-07-19 DIAGNOSIS — Z7182 Exercise counseling: Secondary | ICD-10-CM | POA: Diagnosis not present

## 2021-07-19 DIAGNOSIS — I480 Paroxysmal atrial fibrillation: Secondary | ICD-10-CM | POA: Diagnosis present

## 2021-07-19 DIAGNOSIS — Z79899 Other long term (current) drug therapy: Secondary | ICD-10-CM | POA: Insufficient documentation

## 2021-07-19 DIAGNOSIS — R400 Somnolence: Secondary | ICD-10-CM | POA: Diagnosis not present

## 2021-07-19 DIAGNOSIS — R42 Dizziness and giddiness: Secondary | ICD-10-CM | POA: Insufficient documentation

## 2021-07-19 DIAGNOSIS — E669 Obesity, unspecified: Secondary | ICD-10-CM | POA: Diagnosis not present

## 2021-07-19 DIAGNOSIS — I10 Essential (primary) hypertension: Secondary | ICD-10-CM | POA: Insufficient documentation

## 2021-07-19 DIAGNOSIS — Z6838 Body mass index (BMI) 38.0-38.9, adult: Secondary | ICD-10-CM | POA: Insufficient documentation

## 2021-07-19 DIAGNOSIS — R0683 Snoring: Secondary | ICD-10-CM | POA: Insufficient documentation

## 2021-07-19 MED ORDER — METOPROLOL TARTRATE 25 MG PO TABS: ORAL_TABLET | ORAL | 1 refills | Status: DC

## 2021-07-19 NOTE — Patient Instructions (Signed)
Stop flecainide 

## 2021-07-19 NOTE — Progress Notes (Signed)
Primary Care Physician: Darrol Jump, NP Primary Cardiologist: Dr Claiborne Billings Primary Electrophysiologist: none Referring Physician: [facility]   [facility] is a 51 y.o. male with a history of HTN and atrial fibrillation who presents for follow up in the [facility].  The patient was initially diagnosed with atrial fibrillation 06/07/21 after presenting to his PCP with sinus congestion. ECG showed rapid afib and he was sent to the ED. He was discharged in rate controlled afib. He has a long history of PACs and palpitations. Patient has a CHADS2VASC score of 1. He converted to SR after leaving the ED. He drinks alcohol occasionally but does report snoring and daytime somnolence.   On follow up today, patient reports that he continues to have episodes of palpitations every few days. This only minimally improved with the flecainide. He does report symptoms of dizziness since increasing flecainide. He completed a home sleep study yesterday, results pending.   Today, he denies symptoms of chest pain, shortness of breath, orthopnea, PND, lower extremity edema, presyncope, syncope, bleeding, or neurologic sequela. The patient is tolerating medications without difficulties and is otherwise without complaint today.    Atrial Fibrillation Risk Factors:  he does have symptoms or diagnosis of sleep apnea. he is agreeable for sleep study.  he does not have a history of rheumatic fever. he does have a history of alcohol use. The patient does have a history of early familial atrial fibrillation or other arrhythmias. Mother, father, sister have afib.  he has a BMI of Body mass index is 38.41 kg/m.Marland Kitchen Filed Weights   07/19/21 1450  Weight: 128.5 kg      Family History  Problem Relation Age of Onset   Lung cancer Father      Atrial Fibrillation Management history:  Previous antiarrhythmic drugs: flecainide  Previous cardioversions: none Previous ablations:  none CHADS2VASC score: 1 Anticoagulation history: none   Past Medical History:  Diagnosis Date   Anxiety    Hypertension    Past Surgical History:  Procedure Laterality Date   CHOLECYSTECTOMY N/A 08/14/2017   Procedure: LAPAROSCOPIC CHOLECYSTECTOMY WITH INTRAOPERATIVE CHOLANGIOGRAM;  Surgeon: Donnie Mesa, MD;  Location: [facility];  Service: General;  Laterality: N/A;   LEFT HEART CATH AND CORONARY ANGIOGRAPHY N/A 05/26/2018   Procedure: LEFT HEART CATH AND CORONARY ANGIOGRAPHY;  Surgeon: Troy Sine, MD;  Location: [facility];  Service: Cardiovascular;  Laterality: N/A;   SHOULDER SURGERY      Current Outpatient Medications  Medication Sig Dispense Refill   ALPRAZolam (XANAX) 0.5 MG tablet Take 0.5 mg by mouth daily as needed.     atorvastatin (LIPITOR) 10 MG tablet Take 10 mg by mouth daily.     cetirizine (ZYRTEC) 10 MG tablet Take 10 mg by mouth daily.     lisinopril (ZESTRIL) 10 MG tablet TAKE 1/2 TABLET BY MOUTH DAILY 45 tablet 3   metoprolol succinate (TOPROL-XL) 50 MG 24 hr tablet TAKE 1 TABLET BY MOUTH DAILY 30 tablet 2   nitroGLYCERIN (NITROSTAT) 0.4 MG SL tablet Place 1 tablet (0.4 mg total) under the tongue every 5 (five) minutes as needed for chest pain. 25 tablet 4   fluticasone (FLONASE) 50 MCG/ACT nasal spray Place 1 spray into both nostrils 2 (two) times daily. (Patient not taking: Reported on 07/19/2021)     metoprolol tartrate (LOPRESSOR) 25 MG tablet Take 1 tablet by mouth every 8 hours as needed for afib HR over 120 30 tablet 1  No current facility-administered medications for this encounter.    Allergies  Allergen Reactions   Trolamine Salicylate Rash    Social History   Socioeconomic History   Marital status: Divorced    Spouse name: Not on file   Number of children: Not on file   Years of education: Not on file   Highest education level: Not on file  Occupational History   Not on file  Tobacco Use   Smoking status: Never   Smokeless  tobacco: Never  Vaping Use   Vaping Use: Never used  Substance and Sexual Activity   Alcohol use: Not Currently    Alcohol/week: 6.0 - 8.0 standard drinks    Types: 6 - 8 Cans of beer per week    Comment: no drink since 06/23/21   Drug use: No   Sexual activity: Not on file  Other Topics Concern   Not on file  Social History Narrative   Not on file   Social Determinants of Health   Financial Resource Strain: Not on file  Food Insecurity: Not on file  Transportation Needs: Not on file  Physical Activity: Not on file  Stress: Not on file  Social Connections: Not on file  Intimate Partner Violence: Not on file     ROS- All systems are reviewed and negative except as per the HPI above.  Physical Exam: Vitals:   07/19/21 1450  BP: 126/90  Pulse: 74  Weight: 128.5 kg  Height: 6' (1.829 m)    GEN- The patient is a well appearing obese male, alert and oriented x 3 today.   HEENT-head normocephalic, atraumatic, sclera clear, conjunctiva pink, hearing intact, trachea midline. Lungs- Clear to ausculation bilaterally, normal work of breathing Heart- Regular rate and rhythm, no murmurs, rubs or gallops  GI- soft, NT, ND, + BS Extremities- no clubbing, cyanosis, or edema MS- no significant deformity or atrophy Skin- no rash or lesion Psych- euthymic mood, full affect Neuro- strength and sensation are intact   Wt Readings from Last 3 Encounters:  07/19/21 128.5 kg  07/18/21 124.7 kg  07/08/21 127 kg    EKG today demonstrates  SR Vent. rate 74 BPM PR interval 200 ms QRS duration 92 ms QT/QTcB 392/435 ms  Echo 07/06/21 demonstrated   1. Left ventricular ejection fraction, by estimation, is 60 to 65%. The  left ventricle has normal function. The left ventricle has no regional  wall motion abnormalities. Left ventricular diastolic parameters are  consistent with Grade II diastolic dysfunction (pseudonormalization).   2. Right ventricular systolic function is normal.  The right ventricular  size is normal.   3. The mitral valve is normal in structure. Trivial mitral valve  regurgitation. No evidence of mitral stenosis.   4. The aortic valve is normal in structure. Aortic valve regurgitation is  not visualized. No aortic stenosis is present.   5. The inferior vena cava is normal in size with greater than 50%  respiratory variability, suggesting right atrial pressure of 3 mmHg.   Epic records are reviewed at length today  CHA2DS2-VASc Score = 1  The patient's score is based upon: CHF History: 0 HTN History: 1 Diabetes History: 0 Stroke History: 0 Vascular Disease History: 0 Age Score: 0 Gender Score: 0       ASSESSMENT AND PLAN: 1. Paroxysmal Atrial Fibrillation (ICD10:  I48.0) The patient's CHA2DS2-VASc score is 1, indicating a 0.6% annual risk of stroke.   Patient having dizziness since increasing flecainide. Medication also appears ineffective. We  discussed alternate rhythm control options including changing AAD vs ablation. He would like to discuss ablation with EP, will refer.  Stop flecainide today.  Continue Toprol 50 mg daily with Lopressor 25 mg q 6 hours as needed for heart racing. No anticoagulation indicated at this time with low CV score. Would need this short term if ablation is pursued.   2. Obesity Body mass index is 38.41 kg/m. Lifestyle modification was discussed and encouraged including regular physical activity and weight reduction.  3. Snoring/daytime somnolence  Sleep study completed 07/18/21, results pending.   4. HTN Stable, no changes today.    Follow up with EP to discuss possible ablation.    [facility] [address] [place], [place] [address] [phone] 07/19/2021 4:20 PM

## 2021-07-20 ENCOUNTER — Emergency Department
Admission: EM | Admit: 2021-07-20 | Discharge: 2021-07-20 | Disposition: A | Discharge status: Home / Self Care | Payer: Managed Care, Other (non HMO) | Attending: Emergency Medicine | Admitting: Emergency Medicine

## 2021-07-20 ENCOUNTER — Encounter: Payer: Self-pay | Admitting: Emergency Medicine

## 2021-07-20 ENCOUNTER — Other Ambulatory Visit: Payer: Self-pay

## 2021-07-20 DIAGNOSIS — R42 Dizziness and giddiness: Secondary | ICD-10-CM | POA: Insufficient documentation

## 2021-07-20 DIAGNOSIS — R002 Palpitations: Secondary | ICD-10-CM | POA: Diagnosis not present

## 2021-07-20 DIAGNOSIS — R55 Syncope and collapse: Secondary | ICD-10-CM | POA: Diagnosis not present

## 2021-07-20 HISTORY — DX: Unspecified atrial fibrillation: I48.91

## 2021-07-20 LAB — COMPREHENSIVE METABOLIC PANEL
ALT: 53 U/L — ABNORMAL HIGH (ref 0–44)
AST: 36 U/L (ref 15–41)
Albumin: 4.9 g/dL (ref 3.5–5.0)
Alkaline Phosphatase: 75 U/L (ref 38–126)
Anion gap: 10 (ref 5–15)
BUN: 12 mg/dL (ref 6–20)
CO2: 25 mmol/L (ref 22–32)
Calcium: 9.5 mg/dL (ref 8.9–10.3)
Chloride: 103 mmol/L (ref 98–111)
Creatinine, Ser: 0.92 mg/dL (ref 0.61–1.24)
GFR, Estimated: >60 mL/min (ref >60)
Glucose, Bld: 119 mg/dL — ABNORMAL HIGH (ref 70–99)
Potassium: 3.9 mmol/L (ref 3.5–5.1)
Sodium: 138 mmol/L (ref 135–145)
Total Bilirubin: 2.3 mg/dL — ABNORMAL HIGH (ref 0.3–1.2)
Total Protein: 7.8 g/dL (ref 6.5–8.1)

## 2021-07-20 LAB — CBC WITH DIFFERENTIAL/PLATELET
Abs Immature Granulocytes: 0.02 10*3/uL (ref 0.00–0.07)
Basophils Absolute: 0.1 10*3/uL (ref 0.0–0.1)
Basophils Relative: 1 %
Eosinophils Absolute: 0.2 10*3/uL (ref 0.0–0.5)
Eosinophils Relative: 3 %
HCT: 48 % (ref 39.0–52.0)
Hemoglobin: 17.1 g/dL — ABNORMAL HIGH (ref 13.0–17.0)
Immature Granulocytes: 0 %
Lymphocytes Relative: 25 %
Lymphs Abs: 2 10*3/uL (ref 0.7–4.0)
MCH: 32.3 pg (ref 26.0–34.0)
MCHC: 35.6 g/dL (ref 30.0–36.0)
MCV: 90.6 fL (ref 80.0–100.0)
Monocytes Absolute: 0.5 10*3/uL (ref 0.1–1.0)
Monocytes Relative: 7 %
Neutro Abs: 5.2 10*3/uL (ref 1.7–7.7)
Neutrophils Relative %: 64 %
Platelets: 233 10*3/uL (ref 150–400)
RBC: 5.3 MIL/uL (ref 4.22–5.81)
RDW: 12 % (ref 11.5–15.5)
WBC: 8 10*3/uL (ref 4.0–10.5)
nRBC: 0 % (ref 0.0–0.2)

## 2021-07-20 LAB — TROPONIN I (HIGH SENSITIVITY)
Troponin I (High Sensitivity): <2 ng/L (ref <18)
Troponin I (High Sensitivity): <2 ng/L (ref <18)

## 2021-07-20 NOTE — ED Triage Notes (Signed)
Says chest pain for hour.  Was up last night because his pulse was between 38 and 70.  History of a fib.  Says he has been seen for this pain before, and they have not figured it out.  Says paiin in left chest, sob that has been happening since thanksgiving.  He is rubbing left upper quadrant, but denies any abdominal pain.  Says he feels bad. S welling as well.

## 2021-07-20 NOTE — ED Provider Notes (Signed)
[facility] Provider Note    Event Date/Time   First MD Initiated Contact with Patient 07/20/21 1517     (approximate)   History   Near syncope   HPI  Joshua Simpson is a 51 y.o. male  who, per cardiology office note from yesterday, has history of paroxysmal atrial fibrillation and frequent palpitations, presents to the emergency department after a near syncopal episode.  Patient states that for a little over 1 month he has been having issues with palpitations and near syncope.  He has established care with cardiology and has been diagnosed with paroxysmal A. fib.  He states he noticed last night that his heart rate was jumping around although he did not clinically feel bad at that time and his blood pressure was good.  Then today at work he became very lightheaded and felt like he might pass out.  At the time my exam he states he feels significantly better.  Did stop his flecainide yesterday.  Additionally has metoprolol ordered as needed for tachycardia.      Physical Exam   Triage Vital Signs: ED Triage Vitals  Enc Vitals Group     BP 07/20/21 1226 (!) 132/91     Pulse Rate 07/20/21 1226 83     Resp 07/20/21 1226 20     Temp 07/20/21 1226 97.6 F (36.4 C)     Temp Source 07/20/21 1226 Oral     SpO2 07/20/21 1226 95 %     Weight 07/20/21 1220 283 lb 3.2 oz (128.5 kg)     Height 07/20/21 1220 6' (1.829 m)     Head Circumference --      Peak Flow --      Pain Score 07/20/21 1219 5     Pain Loc --      Pain Edu? --      Excl. in GC? --     Most recent vital signs: Vitals:   07/20/21 1226 07/20/21 1549  BP: (!) 132/91 (!) 139/101  Pulse: 83 77  Resp: 20 15  Temp: 97.6 F (36.4 C) 97.6 F (36.4 C)  SpO2: 95% 97%   General: Awake, no distress.  CV:  Good peripheral perfusion.  Resp:  Normal effort.  Abd:  No distention.  Other:  Slightly anxious.   ED Results / Procedures / Treatments   Labs (all labs ordered are listed, but only  abnormal results are displayed) Labs Reviewed  COMPREHENSIVE METABOLIC PANEL - Abnormal; Notable for the following components:      Result Value   Glucose, Bld 119 (*)    ALT 53 (*)    Total Bilirubin 2.3 (*)    All other components within normal limits  CBC WITH DIFFERENTIAL/PLATELET - Abnormal; Notable for the following components:   Hemoglobin 17.1 (*)    All other components within normal limits  TROPONIN I (HIGH SENSITIVITY)  TROPONIN I (HIGH SENSITIVITY)     EKG  I, Phineas Semen, attending physician, personally viewed and interpreted this EKG  EKG Time: 1154 Rate: 86 Rhythm: normal sinus rhythm Axis: normal Intervals: qtc 442 QRS: narrow ST changes: no st elevation Impression: normal ekg  RADIOLOGY None   PROCEDURES:  Critical Care performed: No  Procedures   MEDICATIONS ORDERED IN ED: Medications - No data to display   IMPRESSION / MDM / ASSESSMENT AND PLAN / ED COURSE  I reviewed the triage vital signs and the nursing notes.  Differential diagnosis includes, but is not limited to, arrhythmia, anemia, electrolyte abnormality, ACS.   Patient presented to the emergency department today because of concerns for near syncopal episode.  Patient has history of paroxysmal A. fib.  Patient's EKG here shows normal sinus rhythm.  Troponin was negative x2, I have low suspicion for ACS.  Additionally no significant anemia that might explain near syncope and electrolytes without concerning abnormality or elevated creatinine signifying significant dehydration.  In discussion with the patient I did encourage patient to continue follow-up with cardiology.  He states he is scheduled to have heart monitor placed tomorrow.  I think this is a good next step.  At this time given that patient's blood work is reassuring he is in normal sinus rhythm and he clinically he states he feels better I do not feel he requires admission to the hospital. Will  discharge to continue cardiology follow up.     FINAL CLINICAL IMPRESSION(S) / ED DIAGNOSES   Final diagnoses:  Near syncope  Palpitations     Rx / DC Orders   ED Discharge Orders     None        Note:  This document was prepared using Dragon voice recognition software and may include unintentional dictation errors.    Phineas Semen, MD 07/20/21 [phone]

## 2021-07-20 NOTE — ED Provider Triage Note (Signed)
Emergency Medicine Provider Triage Evaluation Note  Joshua Simpson , a 51 y.o. male  was evaluated in triage.  Pt complains of chest pain. History of A-fib.  No blood thinners.    Review of Systems  Positive: + nausea, light headed, short of breath +  Negative: No vomiting  Physical Exam  There were no vitals taken for this visit. Gen:   Awake, no distress  anxious Resp:  Normal effort Lungs clear bilaterally MSK:   Moves extremities without difficulty, no  edema lower extremity Other:    Medical Decision Making  Medically screening exam initiated at 11:54 AM.  Appropriate orders placed.  Joshua Simpson was informed that the remainder of the evaluation will be completed by another provider, this initial triage assessment does not replace that evaluation, and the importance of remaining in the ED until their evaluation is complete.     Tommi Rumps, PA-C 07/20/21 1159

## 2021-07-20 NOTE — Discharge Instructions (Signed)
Please seek medical attention for any high fevers, chest pain, shortness of breath, change in behavior, persistent vomiting, bloody stool or any other new or concerning symptoms.  

## 2021-07-21 ENCOUNTER — Ambulatory Visit (HOSPITAL_COMMUNITY)
Admission: RE | Admit: 2021-07-21 | Discharge: 2021-07-21 | Disposition: A | Discharge status: Home / Self Care | Payer: Managed Care, Other (non HMO) | Source: Ambulatory Visit | Attending: Physician Assistant | Admitting: Physician Assistant

## 2021-07-21 VITALS — HR 80

## 2021-07-21 DIAGNOSIS — E669 Obesity, unspecified: Secondary | ICD-10-CM | POA: Insufficient documentation

## 2021-07-21 DIAGNOSIS — F419 Anxiety disorder, unspecified: Secondary | ICD-10-CM | POA: Insufficient documentation

## 2021-07-21 DIAGNOSIS — I480 Paroxysmal atrial fibrillation: Secondary | ICD-10-CM

## 2021-07-21 DIAGNOSIS — R400 Somnolence: Secondary | ICD-10-CM | POA: Diagnosis not present

## 2021-07-21 DIAGNOSIS — I10 Essential (primary) hypertension: Secondary | ICD-10-CM | POA: Insufficient documentation

## 2021-07-21 DIAGNOSIS — Z7901 Long term (current) use of anticoagulants: Secondary | ICD-10-CM | POA: Insufficient documentation

## 2021-07-21 DIAGNOSIS — Z79899 Other long term (current) drug therapy: Secondary | ICD-10-CM | POA: Diagnosis not present

## 2021-07-21 DIAGNOSIS — Z7182 Exercise counseling: Secondary | ICD-10-CM | POA: Diagnosis not present

## 2021-07-21 DIAGNOSIS — R0683 Snoring: Secondary | ICD-10-CM | POA: Diagnosis not present

## 2021-07-21 DIAGNOSIS — R55 Syncope and collapse: Secondary | ICD-10-CM | POA: Insufficient documentation

## 2021-07-21 MED ORDER — METOPROLOL SUCCINATE ER 50 MG PO TB24: 25.0000 mg | ORAL_TABLET | Freq: Every day | ORAL | 2 refills | Status: DC

## 2021-07-21 NOTE — Patient Instructions (Signed)
Decrease metoprolol to 1/2 tablet a day (25mg )

## 2021-07-21 NOTE — Progress Notes (Addendum)
Primary Care Physician: Adela Glimpse, NP Primary Cardiologist: Dr Tresa Endo Primary Electrophysiologist: Dr Elberta Fortis (new) Referring Physician: [facility]   Joshua Simpson is a 51 y.o. male with a history of HTN and atrial fibrillation who presents for follow up in the [facility].  The patient was initially diagnosed with atrial fibrillation 06/07/21 after presenting to his PCP with sinus congestion. ECG showed rapid afib and he was sent to the ED. He was discharged in rate controlled afib. He has a long history of PACs and palpitations. Patient has a CHADS2VASC score of 1. He converted to SR after leaving the ED. He drinks alcohol occasionally but does report snoring and daytime somnolence.   On follow up today, patient presented to the ED with presyncope. He did not lose consciousness. His symptoms resolved after about two hours. He was sitting at his desk at work. Workup at the ED was unrevealing. However, the patient does admit that he has barely been eating for several days because he was afraid that the food would cause him to go into afib. He has noted slow heart rates in the 40s occasionally.   Today, he denies symptoms of chest pain, shortness of breath, orthopnea, PND, lower extremity edema, presyncope, syncope, bleeding, or neurologic sequela. The patient is tolerating medications without difficulties and is otherwise without complaint today.    Atrial Fibrillation Risk Factors:  he does have symptoms or diagnosis of sleep apnea. he is agreeable for sleep study.  he does not have a history of rheumatic fever. he does have a history of alcohol use. The patient does have a history of early familial atrial fibrillation or other arrhythmias. Mother, father, sister have afib.  he has a BMI of There is no height or weight on file to calculate BMI.. There were no vitals filed for this visit.     Family History  Problem Relation Age of Onset   Lung  cancer Father      Atrial Fibrillation Management history:  Previous antiarrhythmic drugs: flecainide  Previous cardioversions: none Previous ablations: none CHADS2VASC score: 1 Anticoagulation history: none   Past Medical History:  Diagnosis Date   Anxiety    Atrial fibrillation (HCC)    Hypertension    Past Surgical History:  Procedure Laterality Date   CHOLECYSTECTOMY N/A 08/14/2017   Procedure: LAPAROSCOPIC CHOLECYSTECTOMY WITH INTRAOPERATIVE CHOLANGIOGRAM;  Surgeon: Manus Rudd, MD;  Location: [facility] OR;  Service: General;  Laterality: N/A;   LEFT HEART CATH AND CORONARY ANGIOGRAPHY N/A 05/26/2018   Procedure: LEFT HEART CATH AND CORONARY ANGIOGRAPHY;  Surgeon: Lennette Bihari, MD;  Location: [facility] INVASIVE CV LAB;  Service: Cardiovascular;  Laterality: N/A;   SHOULDER SURGERY      Current Outpatient Medications  Medication Sig Dispense Refill   ALPRAZolam (XANAX) 0.5 MG tablet Take 0.5 mg by mouth daily as needed.     atorvastatin (LIPITOR) 10 MG tablet Take 10 mg by mouth daily.     cetirizine (ZYRTEC) 10 MG tablet Take 10 mg by mouth daily.     fluticasone (FLONASE) 50 MCG/ACT nasal spray Place 1 spray into both nostrils 2 (two) times daily. (Patient not taking: Reported on 07/19/2021)     lisinopril (ZESTRIL) 10 MG tablet TAKE 1/2 TABLET BY MOUTH DAILY 45 tablet 3   metoprolol succinate (TOPROL-XL) 50 MG 24 hr tablet Take 0.5 tablets (25 mg total) by mouth daily. Take with or immediately following a meal. 30 tablet 2   metoprolol  tartrate (LOPRESSOR) 25 MG tablet Take 1 tablet by mouth every 8 hours as needed for afib HR over 120 30 tablet 1   nitroGLYCERIN (NITROSTAT) 0.4 MG SL tablet Place 1 tablet (0.4 mg total) under the tongue every 5 (five) minutes as needed for chest pain. 25 tablet 4   No current facility-administered medications for this encounter.    Allergies  Allergen Reactions   Trolamine Salicylate Rash    Social History   Socioeconomic History    Marital status: Divorced    Spouse name: Not on file   Number of children: Not on file   Years of education: Not on file   Highest education level: Not on file  Occupational History   Not on file  Tobacco Use   Smoking status: Never   Smokeless tobacco: Never  Vaping Use   Vaping Use: Never used  Substance and Sexual Activity   Alcohol use: Not Currently    Alcohol/week: 6.0 - 8.0 standard drinks    Types: 6 - 8 Cans of beer per week    Comment: no drink since 06/23/21   Drug use: No   Sexual activity: Not on file  Other Topics Concern   Not on file  Social History Narrative   Not on file   Social Determinants of Health   Financial Resource Strain: Not on file  Food Insecurity: Not on file  Transportation Needs: Not on file  Physical Activity: Not on file  Stress: Not on file  Social Connections: Not on file  Intimate Partner Violence: Not on file     ROS- All systems are reviewed and negative except as per the HPI above.  Physical Exam: Vitals:   07/21/21 1324  Pulse: 80   GEN- The patient is a well appearing obese male, alert and oriented x 3 today.   HEENT-head normocephalic, atraumatic, sclera clear, conjunctiva pink, hearing intact, trachea midline. Lungs- Clear to ausculation bilaterally, normal work of breathing Heart- Regular rate and rhythm, no murmurs, rubs or gallops  GI- soft, NT, ND, + BS Extremities- no clubbing, cyanosis, or edema MS- no significant deformity or atrophy Skin- no rash or lesion Psych- euthymic mood, full affect Neuro- strength and sensation are intact   Wt Readings from Last 3 Encounters:  07/20/21 128.5 kg  07/19/21 128.5 kg  07/18/21 124.7 kg    EKG is not ordered today.  Echo 07/06/21 demonstrated   1. Left ventricular ejection fraction, by estimation, is 60 to 65%. The  left ventricle has normal function. The left ventricle has no regional  wall motion abnormalities. Left ventricular diastolic parameters are   consistent with Grade II diastolic dysfunction (pseudonormalization).   2. Right ventricular systolic function is normal. The right ventricular  size is normal.   3. The mitral valve is normal in structure. Trivial mitral valve  regurgitation. No evidence of mitral stenosis.   4. The aortic valve is normal in structure. Aortic valve regurgitation is  not visualized. No aortic stenosis is present.   5. The inferior vena cava is normal in size with greater than 50%  respiratory variability, suggesting right atrial pressure of 3 mmHg.   Epic records are reviewed at length today  CHA2DS2-VASc Score = 1  The patient's score is based upon: CHF History: 0 HTN History: 1 Diabetes History: 0 Stroke History: 0 Vascular Disease History: 0 Age Score: 0 Gender Score: 0       ASSESSMENT AND PLAN: 1. Paroxysmal Atrial Fibrillation (ICD10:  I48.0)  The patient's CHA2DS2-VASc score is 1, indicating a 0.6% annual risk of stroke.   Patient in [place] today. Will have him wear a cardia monitor to evaluate for arrhythmia burden as well as for pauses/symptomatic bradycardia.  He has an appointment with Dr Curt Bears on 1/26 to discuss afib ablation.   Decrease Toprol to 25 mg daily with Lopressor 25 mg q 6 hours as needed for heart racing. No anticoagulation indicated at this time with low CV score. Would need this short term if ablation is pursued.   2. Obesity Lifestyle modification was discussed and encouraged including regular physical activity and weight reduction.  3. Snoring/daytime somnolence  Sleep study completed 07/18/21, results pending.   4. HTN Stable, med changes as above.  5. Presyncope  Related to afib vs bradycardia Monitor as above. Decrease BB as above.  6. Anxiety Patient still has significant anxiety about his health. Previously referred to Dr Michail Sermon of behavioral health but patient has not followed through with appointment. I do think it would be beneficial for him.      Follow up with Dr Curt Bears as scheduled.    [facility] [address] [place], [place] [address] [phone] 07/21/2021 1:24 PM

## 2021-07-24 ENCOUNTER — Telehealth: Payer: Self-pay | Admitting: Cardiovascular Disease

## 2021-07-24 NOTE — Telephone Encounter (Signed)
Spouse called stating she would like to speak to some. She states every time he calls they tell him to go to the ER, she would like for him to be seen by Dr. Tresa Endo.  She states, he is lightheaded, his heart is out of rhythm, he is nauseous and weak.

## 2021-07-24 NOTE — Telephone Encounter (Signed)
Called pt's wife. She states her husband needs to be seen by Dr. Tresa Endo. He has been having lightheadedness, nauseous and irregular heart rate. "He has been crying, he has only cried to me 2 times in 23 years. He just needs to be seen by Dr. Tresa Endo. He has been seen in the emergency department 3 times and they have not found anything. I think he needs a catheterization."  Pt added to schedule 2/8 with Dr. Tresa Endo. Will forward message to Dr. Tresa Endo and his nurse for review.

## 2021-07-25 ENCOUNTER — Telehealth: Payer: Self-pay | Admitting: Internal Medicine

## 2021-07-25 NOTE — Telephone Encounter (Signed)
Paged by Rubye Oaks re atrial fibrillation. Reports he has been going in and out of atrial fibrillation. Has had some heart rates in the 40s but his evening has ranged from the 70s-120s. He reports some mild chest discomfort earlier today, but currently just feels weak and tired. He did take his metoprolol succinate this evening and I asked him to give this some time. He will report to the ED should his symptoms change markedly.

## 2021-07-27 ENCOUNTER — Telehealth: Payer: Self-pay | Admitting: Cardiovascular Disease

## 2021-07-27 NOTE — Telephone Encounter (Signed)
Spoke to patient he stated Metoprolol was decreased to 25 mg daily last Fri.Stated he is having more irregular heart beats.Rate 60 to 90.Stated he feels awful,tired,no energy.Advised I will send message to [facility] PA in Afib clinic for advice.I will also call EP scheduler for a sooner appointment with Dr.Camnitz.

## 2021-07-27 NOTE — Telephone Encounter (Signed)
Spoke to patient [employer] PA's advice given.Advised EP scheduler will call back if a cancellation becomes available.Advised to call sooner if needed.

## 2021-07-27 NOTE — Telephone Encounter (Signed)
Pt c/o medication issue:  1. Name of Medication: metoprolol succinate (TOPROL-XL) 50 MG 24 hr tablet  2. How are you currently taking this medication (dosage and times per day)? Take 0.5 tablets (25 mg total) by mouth daily. Take with or immediately following a meal.  3. Are you having a reaction (difficulty breathing--STAT)? no  4. What is your medication issue? Patient states he is having irregular heartbeat. He doesn't think it's A-Fib that he is being treated for.

## 2021-08-01 NOTE — Telephone Encounter (Signed)
Patient is to see Dr.Camntiz on 01/26 will wait for this visit to review further.

## 2021-08-02 ENCOUNTER — Telehealth: Payer: Self-pay | Admitting: Cardiology

## 2021-08-02 NOTE — Telephone Encounter (Signed)
Pt called that he was back in a fib at rate 130.  + SOB but no chest pain.  He was asking to take the toprol or lopressor first,  I asked him to take the toprol and if no better in 1-1.5 hours then to take the lopressor.  If still no better in 1-2 hours to call us back.  He had similar episode yesterday.  Recently BB decreased due to bradycardia.   He is to see Dr. Elberta Fortis this month.  I will send note to the PA in a fib clinic.

## 2021-08-08 NOTE — Addendum Note (Signed)
Encounter addended by: Shona Simpson, RN on: 08/08/2021 3:57 PM  Actions taken: Imaging Exam ended

## 2021-08-10 ENCOUNTER — Ambulatory Visit (INDEPENDENT_AMBULATORY_CARE_PROVIDER_SITE_OTHER): Payer: Managed Care, Other (non HMO) | Admitting: Cardiology

## 2021-08-10 ENCOUNTER — Other Ambulatory Visit: Payer: Self-pay

## 2021-08-10 ENCOUNTER — Encounter: Payer: Self-pay | Admitting: Cardiology

## 2021-08-10 VITALS — BP 120/96 | HR 81 | Ht 72.0 in | Wt 274.0 lb

## 2021-08-10 DIAGNOSIS — Z01812 Encounter for preprocedural laboratory examination: Secondary | ICD-10-CM

## 2021-08-10 DIAGNOSIS — I480 Paroxysmal atrial fibrillation: Secondary | ICD-10-CM | POA: Diagnosis not present

## 2021-08-10 MED ORDER — NITROGLYCERIN 0.4 MG SL SUBL: 0.4000 mg | SUBLINGUAL_TABLET | SUBLINGUAL | 4 refills | Status: DC | PRN

## 2021-08-10 MED ORDER — APIXABAN 5 MG PO TABS: 5.0000 mg | ORAL_TABLET | Freq: Two times a day (BID) | ORAL | 3 refills | Status: DC

## 2021-08-10 MED ORDER — AMIODARONE HCL 200 MG PO TABS: ORAL_TABLET | ORAL | 3 refills | Status: DC

## 2021-08-10 NOTE — Patient Instructions (Addendum)
Medication Instructions:  Your physician has recommended you make the following change in your medication:  START Amiodarone -- - Take 2 tablets (400 mg total) TWICE daily for 2 weeks, then reduce and  - Take 1 tablet (200 mg total) ONCE daily  2. START Eliquis 5 mg TWICE daily -- start this on 09/01/21  *If you need a refill on your cardiac medications before your next appointment, please call your pharmacy*    Lab Work: Pre procedure labs 3/3:  BMP & CBC (See procedure instructions below for further details)  If you have labs (blood work) drawn today and your tests are completely normal, you will receive your results only by: [place] (if you have MyChart) OR A paper copy in the mail If you have any lab test that is abnormal or we need to change your treatment, we will call you to review the results.   Testing/Procedures: Your physician has requested that you have cardiac CT within 7 days PRIOR to your ablation. Cardiac computed tomography (CT) is a painless test that uses an x-ray machine to take clear, detailed pictures of your heart.  Please follow instruction below located under "other instructions". You will get a call from our office to schedule the date for this test.  Your physician has recommended that you have an ablation. Catheter ablation is a medical procedure used to treat some cardiac arrhythmias (irregular heartbeats). During catheter ablation, a long, thin, flexible tube is put into a blood vessel in your groin (upper thigh), or neck. This tube is called an ablation catheter. It is then guided to your heart through the blood vessel. Radio frequency waves destroy small areas of heart tissue where abnormal heartbeats may cause an arrhythmia to start. Please follow instruction below located under "other instructions".   Follow-Up: At [facility], you and your health needs are our priority.  As part of our continuing mission to provide you with exceptional heart  care, we have created designated Provider Care Teams.  These Care Teams include your primary Cardiologist (physician) and Advanced Practice Providers (APPs -  Physician Assistants and Nurse Practitioners) who all work together to provide you with the care you need, when you need it.   Your next appointment:   1 month(s) after your ablation  The format for your next appointment:   In Person  Provider:   AFib clinic   Thank you for choosing [facility]!!   Trinidad Curet, RN [phone]    Other Instructions   CT INSTRUCTIONS Your cardiac CT will be scheduled at:  [facility] [address] [place], [place] [address] [phone]  Please arrive at the [facility] main entrance of [facility] 30 minutes prior to test start time. Proceed to the [facility] Radiology Department (first floor) to check-in and test prep.   Please follow these instructions carefully (unless otherwise directed):  Hold all erectile dysfunction medications at least 3 days (72 hrs) prior to test.  On the Night Before the Test: Be sure to Drink plenty of water. Do not consume any caffeinated/decaffeinated beverages or chocolate 12 hours prior to your test. Do not take any antihistamines 12 hours prior to your test.  On the Day of the Test: Drink plenty of water until 1 hour prior to the test. Do not eat any food 4 hours prior to the test. You may take your regular medications prior to the test.  Take metoprolol (Lopressor) two hours prior to test. HOLD  Furosemide/Hydrochlorothiazide morning of the test.      After the Test: Drink plenty of water. After receiving IV contrast, you may experience a mild flushed feeling. This is normal. On occasion, you may experience a mild rash up to 24 hours after the test. This is not dangerous. If this occurs, you can take Benadryl 25 mg and increase your fluid intake. If you experience trouble breathing, this can be serious. If it  is severe call 911 IMMEDIATELY. If it is mild, please call our office. If you take any of these medications: Glipizide/Metformin, Avandament, Glucavance, please do not take 48 hours after completing test unless otherwise instructed.   Once we have confirmed authorization from your insurance company, we will call you to set up a date and time for your test. Based on how quickly your insurance processes prior authorizations requests, please allow up to 4 weeks to be contacted for scheduling your Cardiac CT appointment. Be advised that routine Cardiac CT appointments could be scheduled as many as 8 weeks after your provider has ordered it.  For non-scheduling related questions, please contact the cardiac imaging nurse navigator should you have any questions/concerns: Marchia Bond, Cardiac Imaging Nurse Navigator Gordy Clement, Cardiac Imaging Nurse Navigator [place] [facility] Direct Office Dial: [phone]   For scheduling needs, including cancellations and rescheduling, please call [place], [phone].      Electrophysiology/Ablation Procedure Instructions   You are scheduled for a(n)  ablation on 09/26/2021 with Dr. Allegra Lai.   1.   Pre procedure testing-             A.  LAB WORK --- On 09/15/21 for your pre procedure blood work.  You do NOT need to be fasting.  You can stop by the [employer] for this. Stop by anytime between 7:30 am - 4:30 pm   On the day of your procedure 09/26/2021 you will go to [facility] hospital ([address]) at 8:30 am.  Dennis Bast will go to the main entrance A [employer]) and enter where the [employer] are.  Your driver will drop you off and you will head down the hallway to ADMITTING.  You may have one support person come in to the hospital with you.  They will be asked to wait in the waiting room. It is OK to have someone drop you off and come back when you are ready to be discharged.   3.   Do not eat or drink  after midnight prior to your procedure.   4.   On the morning of your procedure do NOT take any medication. Do not miss any doses of your blood thinner prior to the morning of your procedure or your procedure will need to be rescheduled.   5.  Plan for an overnight stay but you may be discharged after your procedure, if you use your phone frequently bring your phone charger. If you are discharged after your procedure you will need someone to drive you home and be with you for 24 hours after your procedure.   6. You will follow up with the AFIB clinic 4 weeks after your procedure.  You will follow up with Dr. Curt Bears  3 months after your procedure.  These appointments will be made for you.   7. FYI: For your safety, and to allow [place] to monitor your vital signs accurately during the surgery/procedure we request that if you have artificial nails, gel coating, SNS etc. Please  have those removed prior to your surgery/procedure. Not having the nail coverings /polish removed may result in cancellation or delay of your surgery/procedure.  * If you have ANY questions please call the office [phone] and ask for Darcel Frane RN or send me a MyChart message   * Occasionally, EP Studies and ablations can become lengthy.  Please make your family aware of this before your procedure starts.  Average time ranges from 2-8 hours for EP studies/ablations.  Your physician will call your family after the procedure with the results.                                    Cardiac Ablation Cardiac ablation is a procedure to destroy (ablate) some heart tissue that is sending bad signals. These bad signals cause problems in heart rhythm. The heart has many areas that make these signals. If there are problems in these areas, they can make the heart beat in a way that is not normal. Destroying some tissues can help make the heart rhythm normal. Tell your doctor about: Any allergies you have. All medicines you are taking. These  include vitamins, herbs, eye drops, creams, and over-the-counter medicines. Any problems you or family members have had with medicines that make you fall asleep (anesthetics). Any blood disorders you have. Any surgeries you have had. Any medical conditions you have, such as kidney failure. Whether you are pregnant or may be pregnant. What are the risks? This is a safe procedure. But problems may occur, including: Infection. Bruising and bleeding. Bleeding into the chest. Stroke or blood clots. Damage to nearby areas of your body. Allergies to medicines or dyes. The need for a pacemaker if the normal system is damaged. Failure of the procedure to treat the problem. What happens before the procedure? Medicines Ask your doctor about: Changing or stopping your normal medicines. This is important. Taking aspirin and ibuprofen. Do not take these medicines unless your doctor tells you to take them. Taking other medicines, vitamins, herbs, and supplements. General instructions Follow instructions from your doctor about what you cannot eat or drink. Plan to have someone take you home from the hospital or clinic. If you will be going home right after the procedure, plan to have someone with you for 24 hours. Ask your doctor what steps will be taken to prevent infection. What happens during the procedure?  An IV tube will be put into one of your veins. You will be given a medicine to help you relax. The skin on your neck or groin will be numbed. A cut (incision) will be made in your neck or groin. A needle will be put through your cut and into a large vein. A tube (catheter) will be put into the needle. The tube will be moved to your heart. Dye may be put through the tube. This helps your doctor see your heart. Small devices (electrodes) on the tube will send out signals. A type of energy will be used to destroy some heart tissue. The tube will be taken out. Pressure will be held on your  cut. This helps stop bleeding. A bandage will be put over your cut. The exact procedure may vary among doctors and hospitals. What happens after the procedure? You will be watched until you leave the hospital or clinic. This includes checking your heart rate, breathing rate, oxygen, and blood pressure. Your cut will be watched for bleeding.  You will need to lie still for a few hours. Do not drive for 24 hours or as long as your doctor tells you. Summary Cardiac ablation is a procedure to destroy some heart tissue. This is done to treat heart rhythm problems. Tell your doctor about any medical conditions you may have. Tell him or her about all medicines you are taking to treat them. This is a safe procedure. But problems may occur. These include infection, bruising, bleeding, and damage to nearby areas of your body. Follow what your doctor tells you about food and drink. You may also be told to change or stop some of your medicines. After the procedure, do not drive for 24 hours or as long as your doctor tells you. This information is not intended to replace advice given to you by your health care provider. Make sure you discuss any questions you have with your health care provider. Document Revised: 06/04/2019 Document Reviewed: 06/04/2019 Elsevier Patient Education  2022 [employer].

## 2021-08-10 NOTE — Progress Notes (Signed)
Electrophysiology Office Note   Date:  08/10/2021   ID:  Joshua Simpson, DOB [birth year], MRN [id number]  PCP:  Darrol Jump, NP  Cardiologist:  Claiborne Billings Primary Electrophysiologist:  Mikia Delaluz Meredith Leeds, MD    Chief Complaint: AF   History of Present Illness: Joshua A Hemberger is a 51 y.o. male who is being seen today for the evaluation of AF at the request of Fenton, Clint R, PA. Presenting today for electrophysiology evaluation.  He has a history significant for hypertension and atrial fibrillation.  He was initially diagnosed with atrial fibrillation on 06/07/2021 after presenting to his PCP.  ECG at the time showed rapid atrial fibrillation and was sent to the emergency room.  He converted to sinus rhythm before leaving the emergency room.  He then presented to the emergency room with presyncope.  He did not lose consciousness.  His symptoms resolved after approximately 2 hours.  He wore a cardiac monitor which showed a 21% atrial fibrillation burden.  Today, he denies symptoms of palpitations, chest pain, shortness of breath, orthopnea, PND, lower extremity edema, claudication, dizziness, presyncope, syncope, bleeding, or neurologic sequela. The patient is tolerating medications without difficulties.  He feels well today.  When he is in atrial fibrillation he has significant fatigue and shortness of breath.  He is also quite anxious most of the time as he feels quite poorly when he is in atrial fibrillation.  It is keeping him from doing all of his daily activities, restricted by his level of anxiety.   Past Medical History:  Diagnosis Date   Anxiety    Atrial fibrillation ([place])    Hypertension    Past Surgical History:  Procedure Laterality Date   CHOLECYSTECTOMY N/A 08/14/2017   Procedure: LAPAROSCOPIC CHOLECYSTECTOMY WITH INTRAOPERATIVE CHOLANGIOGRAM;  Surgeon: Donnie Mesa, MD;  Location: [facility];  Service: General;  Laterality: N/A;   LEFT HEART CATH AND CORONARY ANGIOGRAPHY N/A  05/26/2018   Procedure: LEFT HEART CATH AND CORONARY ANGIOGRAPHY;  Surgeon: Troy Sine, MD;  Location: [facility];  Service: Cardiovascular;  Laterality: N/A;   SHOULDER SURGERY       Current Outpatient Medications  Medication Sig Dispense Refill   ALPRAZolam (XANAX) 0.5 MG tablet Take 0.5 mg by mouth daily as needed.     amiodarone (PACERONE) 200 MG tablet Take 2 tablets (400 mg total) TWICE daily for 2 weeks, then reduce and take 1 tablet (200 mg total) ONCE daily 90 tablet 3   amoxicillin-clavulanate (AUGMENTIN) 875-125 MG tablet Take 1 tablet by mouth 2 (two) times daily.     apixaban (ELIQUIS) 5 MG TABS tablet Take 1 tablet (5 mg total) by mouth 2 (two) times daily. 60 tablet 3   cetirizine (ZYRTEC) 10 MG tablet Take 10 mg by mouth daily.     lisinopril (ZESTRIL) 10 MG tablet TAKE 1/2 TABLET BY MOUTH DAILY 45 tablet 3   metoprolol succinate (TOPROL-XL) 50 MG 24 hr tablet Take 1/2 tablet ( 25 mg ) twice a day     metoprolol tartrate (LOPRESSOR) 25 MG tablet Take 1 tablet by mouth every 8 hours as needed for afib HR over 120 30 tablet 1   atorvastatin (LIPITOR) 10 MG tablet Take 10 mg by mouth daily. (Patient not taking: Reported on 08/10/2021)     fluticasone (FLONASE) 50 MCG/ACT nasal spray Place 1 spray into both nostrils 2 (two) times daily. (Patient not taking: Reported on 07/19/2021)     nitroGLYCERIN (NITROSTAT) 0.4 MG SL tablet  Place 1 tablet (0.4 mg total) under the tongue every 5 (five) minutes as needed for chest pain. 25 tablet 4   No current facility-administered medications for this visit.    Allergies:   Trolamine salicylate   Social History:  The patient  reports that he has never smoked. He has never used smokeless tobacco. He reports that he does not currently use alcohol after a past usage of about 6.0 - 8.0 standard drinks per week. He reports that he does not use drugs.   Family History:  The patient's family history includes Lung cancer in his father.     ROS:  Please see the history of present illness.   Otherwise, review of systems is positive for none.   All other systems are reviewed and negative.    PHYSICAL EXAM: VS:  BP (!) 120/96    Pulse 81    Ht 6' (1.829 m)    Wt 274 lb (124.3 kg)    SpO2 96%    BMI 37.16 kg/m  , BMI Body mass index is 37.16 kg/m. GEN: Well nourished, well developed, in no acute distress  HEENT: normal  Neck: no JVD, carotid bruits, or masses Cardiac: RRR; no murmurs, rubs, or gallops,no edema  Respiratory:  clear to auscultation bilaterally, normal work of breathing GI: soft, nontender, nondistended, + BS MS: no deformity or atrophy  Skin: warm and dry Neuro:  Strength and sensation are intact Psych: euthymic mood, full affect  EKG:  EKG is ordered today. Personal review of the ekg ordered shows sinus rhythm  Recent Labs: 07/08/2021: Magnesium 2.1; TSH 1.603 07/20/2021: ALT 53; BUN 12; Creatinine, Ser 0.92; Hemoglobin 17.1; Platelets 233; Potassium 3.9; Sodium 138    Lipid Panel     Component Value Date/Time   CHOL 216 (H) 01/09/2018 0951   TRIG 91 01/09/2018 0951   HDL 55 01/09/2018 0951   CHOLHDL 3.9 01/09/2018 0951   LDLCALC 143 (H) 01/09/2018 0951     Wt Readings from Last 3 Encounters:  08/10/21 274 lb (124.3 kg)  07/20/21 283 lb 3.2 oz (128.5 kg)  07/19/21 283 lb 3.2 oz (128.5 kg)      Other studies Reviewed: Additional studies/ records that were reviewed today include: TTE 07/06/21  Review of the above records today demonstrates:   1. Left ventricular ejection fraction, by estimation, is 60 to 65%. The  left ventricle has normal function. The left ventricle has no regional  wall motion abnormalities. Left ventricular diastolic parameters are  consistent with Grade II diastolic  dysfunction (pseudonormalization).   2. Right ventricular systolic function is normal. The right ventricular  size is normal.   3. The mitral valve is normal in structure. Trivial mitral valve   regurgitation. No evidence of mitral stenosis.   4. The aortic valve is normal in structure. Aortic valve regurgitation is  not visualized. No aortic stenosis is present.   5. The inferior vena cava is normal in size with greater than 50%  respiratory variability, suggesting right atrial pressure of 3 mmHg.   Cardiac monitor 08/08/2021 personally reviewed Predominant underlying rhythm was sinus rhythm Multiple SVT episodes, all less than 18 seconds 21% atrial fibrillation/flutter burden Less than 1% ventricular and supraventricular ectopy Triggered events associated with atrial fibrillation/flutter and PACs   ASSESSMENT AND PLAN:  1.  Paroxysmal atrial fibrillation: CHA2DS2-VASc of 1.  Cardiac monitor showed an elevated burden of atrial fibrillation.  Currently on metoprolol.  Not anticoagulated due to his low stroke risk.  We discussed further options of atrial fibrillation therapy including ablation versus medication management.  At this point he would like to avoid medications and would prefer ablation.  We Camarie Mctigue start him on Eliquis prior to ablation.  As he is so symptomatic from his atrial fibrillation, we Beckam Abdulaziz start him on amiodarone prior to ablation.  Risk, benefits, and alternatives to EP study and radiofrequency ablation for afib were also discussed in detail today. These risks include but are not limited to stroke, bleeding, vascular damage, tamponade, perforation, damage to the esophagus, lungs, and other structures, pulmonary vein stenosis, worsening renal function, and death. The patient understands these risk and wishes to proceed.  We Calandria Mullings therefore proceed with catheter ablation at the next available time.  Carto, ICE, anesthesia are requested for the procedure.  Jaquelin Meaney also obtain CT PV protocol prior to the procedure to exclude LAA thrombus and further evaluate atrial anatomy.   2.  Obesity: Body mass index is 37.16 kg/m. Weight loss encouraged  3.  Snoring/daytime  somnolence: Sleep study results pending  4.  Hypertension: Mildly elevated today but well controlled on prior checks  Case discussed with primary cardiology  Current medicines are reviewed at length with the patient today.   The patient does not have concerns regarding his medicines.  The following changes were made today: Start amiodarone  Labs/ tests ordered today include:  Orders Placed This Encounter  Procedures   CT CARDIAC MORPH/PULM VEIN W/CM&W/O CA SCORE   Basic metabolic panel   CBC   EKG 12-Lead     Disposition:   FU with Kya Mayfield 3 months  Signed, Julieth Tugman Meredith Leeds, MD  08/10/2021 9:59 AM     [facility] [address] [place] [address] [phone] (office) [phone] (fax)

## 2021-08-11 ENCOUNTER — Encounter (HOSPITAL_BASED_OUTPATIENT_CLINIC_OR_DEPARTMENT_OTHER): Payer: Self-pay | Admitting: Cardiovascular Disease

## 2021-08-11 NOTE — Procedures (Signed)
° ° ° °  Patient Name: Joshua Simpson, Joshua Simpson Study Date: 07/18/2021 Gender: Male D.O.B: 26-Jan-1971 Age (years): 41 Referring Provider: Nicki Guadalajara MD, ABSM Height (inches): 72 Interpreting Physician: Nicki Guadalajara MD, ABSM Weight (lbs): 275 RPSGT: [place]ene Sink BMI: 37 MRN: [id number] Neck Size: 18.00  CLINICAL INFORMATION Sleep Study Type: HST  Indication for sleep study: Excessive Daytime Sleepiness, Snoring, Atrial fibrillation  Epworth Sleepiness Score: 5  SLEEP STUDY TECHNIQUE A multi-channel overnight portable sleep study was performed. The channels recorded were: nasal airflow, thoracic respiratory movement, and oxygen saturation with a pulse oximetry. Snoring was also monitored.  MEDICATIONS ALPRAZolam (XANAX) 0.5 MG tablet amiodarone (PACERONE) 200 MG tablet amoxicillin-clavulanate (AUGMENTIN) 875-125 MG tablet apixaban (ELIQUIS) 5 MG TABS tablet atorvastatin (LIPITOR) 10 MG tablet cetirizine (ZYRTEC) 10 MG tablet fluticasone (FLONASE) 50 MCG/ACT nasal spray lisinopril (ZESTRIL) 10 MG tablet metoprolol succinate (TOPROL-XL) 50 MG 24 hr tablet metoprolol tartrate (LOPRESSOR) 25 MG tablet nitroGLYCERIN (NITROSTAT) 0.4 MG SL tablet Patient self administered medications include: N/A.  SLEEP ARCHITECTURE Patient was studied for 365.5 minutes. The sleep efficiency was 100.0 % and the patient was supine for 56.7%. The arousal index was 0.0 per hour.  RESPIRATORY PARAMETERS The overall AHI was 38.7 per hour, with a central apnea index of 0 per hour.  The oxygen nadir was 80% during sleep.  CARDIAC DATA Mean heart rate during sleep was 60.6 bpm.  IMPRESSIONS - Severe obstructive sleep apnea occurred during this study (AHI 38.7/h). There is a significant positional component with supine sleep AHI 55.3/h versus nonsupine sleep AHI 17.2/h. - Severe oxygen desaturation to a nadir of 80%. - Patient snored 19.4% during the sleep.  DIAGNOSIS - Obstructive Sleep Apnea  (G47.33) - Nocturnal Hypoxemia (G47.36)  RECOMMENDATIONS - In this patient with severe sleep apnea and cardiovascular comorbidities incliuding atrial fibrillation recommend an in-lab CPAP titration study. If unable for in-lab evaluation, initiate Auto-PAP at 6 - 18 cm of water. - Effort should be made to optimize nasal and oropharyngeal patency. - Positional therapy avoiding supine position during sleep. - Avoid alcohol, sedatives and other CNS depressants that may worsen sleep apnea and disrupt normal sleep architecture. - Sleep hygiene should be reviewed to assess factors that may improve sleep quality. - Weight management (BMI 37) and regular exercise should be initiated or continued. - Recommend a download and sleep clinic evaluation after one month of therapy.   [Electronically signed] 08/11/2021 08:12 AM  Nicki Guadalajara MD, Donneta Romberg Diplomate, American Board of Sleep Medicine   NPI: [id number]  ELECTRONICALLY SIGNED ON:  08/11/2021, 8:13 AM [facility] PH: [phone]   FX: [phone] ACCREDITED BY THE AMERICAN ACADEMY OF SLEEP MEDICINE

## 2021-08-14 ENCOUNTER — Other Ambulatory Visit (HOSPITAL_COMMUNITY): Payer: Managed Care, Other (non HMO)

## 2021-08-16 ENCOUNTER — Other Ambulatory Visit: Payer: Self-pay | Admitting: Cardiovascular Disease

## 2021-08-16 ENCOUNTER — Telehealth: Payer: Self-pay | Admitting: *Deleted

## 2021-08-16 ENCOUNTER — Telehealth: Payer: Self-pay | Admitting: Cardiovascular Disease

## 2021-08-16 DIAGNOSIS — G4736 Sleep related hypoventilation in conditions classified elsewhere: Secondary | ICD-10-CM

## 2021-08-16 DIAGNOSIS — I480 Paroxysmal atrial fibrillation: Secondary | ICD-10-CM

## 2021-08-16 DIAGNOSIS — G4733 Obstructive sleep apnea (adult) (pediatric): Secondary | ICD-10-CM

## 2021-08-16 DIAGNOSIS — I10 Essential (primary) hypertension: Secondary | ICD-10-CM

## 2021-08-16 NOTE — Telephone Encounter (Signed)
-----   Message from Lennette Bihari, MD sent at 08/11/2021  8:18 AM EST ----- Joshua Simpson please notify patient the results of his sleep study.  Try for in lab CPAP titration but if unable initiate AutoPap as prescribed.

## 2021-08-16 NOTE — Telephone Encounter (Signed)
Pt advised to decrease Amiodarone to 200 mg BID. He will let us know if no improvement. Patient verbalized understanding and agreeable to plan.

## 2021-08-16 NOTE — Telephone Encounter (Signed)
Called patient back, he states he has had some episodes of slower heart rates, he states he has a watch that tells him when the HR is lower, it runs in the 50's to 60's. He only complains of having one episode of seeing "stars", but he had moved quickly from bending over when this occurred. Patient believes it was the Amiodarone- patient is scheduled for ablation in March with Dr.Camntiz, he questioned if he should take it today- I did advise to continue medications until advised otherwise by [facility]. patient states he was just concerned with this and wanted to call. I did advise him to stay hydrated. Keep appointment with Dr.Kelly on 02/08- and I would route to Dr.Camntiz just as an FYI in case he had any other recommendations.

## 2021-08-16 NOTE — Telephone Encounter (Addendum)
Dr. Elberta Fortis should loading dose be reduced? Pt seen 1/26, Amiodarone started (400 mg BID x 2 weeks, then 200 QD)..... should try and reduce to 200 mg BID and see if improvement?  Or reduce to 200 mg QD? PVI scheduled for 09/26/21

## 2021-08-16 NOTE — Telephone Encounter (Signed)
Patient notified of HST results and recommendations. He agrees to proceed with CPAP titration sleep study. °

## 2021-08-16 NOTE — Telephone Encounter (Signed)
Pt c/o medication issue:  1. Name of Medication: all  2. How are you currently taking this medication (dosage and times per day)?    3. Are you having a reaction (difficulty breathing--STAT)? no  4. What is your medication issue? Calling in to see if any changes need to be med to his medications because his heart rate is low.

## 2021-08-18 ENCOUNTER — Telehealth: Payer: Self-pay | Admitting: Cardiovascular Disease

## 2021-08-18 NOTE — Telephone Encounter (Signed)
Spoke with patient of Dr. Tresa Endo   He reports his BP is elevated every time he goes to doctor's office, notices elevated BP when sitting (compared to lying down)  He has a wrist cuff at home 150/101 last night while sitting up He reports his lying BPs are lower He reports a low HR - 60-65 BP this morning is 130/94  He reports he has not felt well since his AFib started acting up around Thanksgiving He reports eating dinner last night and he felt unwell in his stomach, he could feel heart beat in his stomach He reports he went out of rhythm around 10pm  He takes amiodarone 200mg  BID (tapered dose), lisinopril 5mg , metoprolol succinate 25mg  BID  He would like to know if he should increase any medication dose (such as lisinopril?)  He has a visit with Dr. next Wednesday. Advised to bring home BP cuff and readings to this visit  Routed to MD/clinical pharmacy team to review

## 2021-08-18 NOTE — Telephone Encounter (Signed)
Spoke with patient -- advised to increase lisinopril to 10mg  daily (per [facility] as noted).   He also inquired if OK to take zofran -- not on med list  Per Gerald Stabs, [facility] -- should NOT take zofran, pepto OK Concurrent use of AMIODARONE and QT PROLONGING AGENTS may result in increased risk of QT prolongation and torsades de pointes.

## 2021-08-18 NOTE — Telephone Encounter (Signed)
Pt c/o BP issue: STAT if pt c/o blurred vision, one-sided weakness or slurred speech  1. What are your last 5 BP readings? 150/101, sitting up  120/83  laying down  this morning 135/94  2. Are you having any other symptoms (ex. Dizziness, headache, blurred vision, passed out)? lightheaded  3. What is your BP issue? Blood pressure is high when he sits up

## 2021-08-18 NOTE — Telephone Encounter (Signed)
Yes, increasing lisinopril to 10mg  (two 5mg  tablets) is reasonable until he sees Dr next week

## 2021-08-18 NOTE — Telephone Encounter (Signed)
Agree with recommendations.  

## 2021-08-23 ENCOUNTER — Ambulatory Visit (INDEPENDENT_AMBULATORY_CARE_PROVIDER_SITE_OTHER): Payer: Managed Care, Other (non HMO) | Admitting: Cardiovascular Disease

## 2021-08-23 ENCOUNTER — Encounter: Payer: Self-pay | Admitting: Cardiovascular Disease

## 2021-08-23 ENCOUNTER — Other Ambulatory Visit: Payer: Self-pay

## 2021-08-23 DIAGNOSIS — R002 Palpitations: Secondary | ICD-10-CM

## 2021-08-23 DIAGNOSIS — I480 Paroxysmal atrial fibrillation: Secondary | ICD-10-CM | POA: Diagnosis not present

## 2021-08-23 DIAGNOSIS — G4733 Obstructive sleep apnea (adult) (pediatric): Secondary | ICD-10-CM | POA: Diagnosis not present

## 2021-08-23 DIAGNOSIS — I10 Essential (primary) hypertension: Secondary | ICD-10-CM

## 2021-08-23 DIAGNOSIS — E785 Hyperlipidemia, unspecified: Secondary | ICD-10-CM

## 2021-08-23 DIAGNOSIS — E782 Mixed hyperlipidemia: Secondary | ICD-10-CM | POA: Diagnosis not present

## 2021-08-23 DIAGNOSIS — E669 Obesity, unspecified: Secondary | ICD-10-CM

## 2021-08-23 DIAGNOSIS — G4736 Sleep related hypoventilation in conditions classified elsewhere: Secondary | ICD-10-CM | POA: Diagnosis not present

## 2021-08-23 MED ORDER — ROSUVASTATIN CALCIUM 20 MG PO TABS: 20.0000 mg | ORAL_TABLET | Freq: Every day | ORAL | 3 refills | Status: DC

## 2021-08-23 MED ORDER — LISINOPRIL 10 MG PO TABS: 10.0000 mg | ORAL_TABLET | Freq: Every day | ORAL | 3 refills | Status: DC

## 2021-08-23 NOTE — Patient Instructions (Addendum)
Medication Instructions:   Increase to Lisinopril 10 mg  daily   Rosuvastatin  20 mg at bedtime  Stop taking atorvastatin    *If you need a refill on your cardiac medications before your next appointment, please call your pharmacy*   Lab Work: CMP LIPID TSH If you have labs (blood work) drawn today and your tests are completely normal, you will receive your results only by: MyChart Message (if you have MyChart) OR A paper copy in the mail If you have any lab test that is abnormal or we need to change your treatment, we will call you to review the results.   Testing/Procedures:  Not needed  Follow-Up: At [facility], you and your health needs are our priority.  As part of our continuing mission to provide you with exceptional heart care, we have created designated Provider Care Teams.  These Care Teams include your primary Cardiologist (physician) and Advanced Practice Providers (APPs -  Physician Assistants and Nurse Practitioners) who all work together to provide you with the care you need, when you need it.     Your next appointment:   3 month(s)  The format for your next appointment:   In Person  Provider:   Nicki Guadalajara, MD    Other Instructions    Will contact you in regards to Sleep equipment

## 2021-08-23 NOTE — Progress Notes (Deleted)
Cardiology Office Note    Date:  08/23/2021   ID:  Joshua Simpson, DOB [birth year], MRN [id number]  PCP:  Darrol Jump, NP  Cardiologist:  Shelva Majestic, MD   No chief complaint on file.   History of Present Illness:  Joshua Simpson is a 51 y.o. male ***    Past Medical History:  Diagnosis Date   Anxiety    Atrial fibrillation ([place])    Hypertension     Past Surgical History:  Procedure Laterality Date   CHOLECYSTECTOMY N/A 08/14/2017   Procedure: LAPAROSCOPIC CHOLECYSTECTOMY WITH INTRAOPERATIVE CHOLANGIOGRAM;  Surgeon: Donnie Mesa, MD;  Location: [facility];  Service: General;  Laterality: N/A;   LEFT HEART CATH AND CORONARY ANGIOGRAPHY N/A 05/26/2018   Procedure: LEFT HEART CATH AND CORONARY ANGIOGRAPHY;  Surgeon: Troy Sine, MD;  Location: [facility];  Service: Cardiovascular;  Laterality: N/A;   SHOULDER SURGERY      Current Medications: Outpatient Medications Prior to Visit  Medication Sig Dispense Refill   ALPRAZolam (XANAX) 0.5 MG tablet Take 0.5 mg by mouth daily as needed.     amiodarone (PACERONE) 200 MG tablet Take 2 tablets (400 mg total) TWICE daily for 2 weeks, then reduce and take 1 tablet (200 mg total) ONCE daily 90 tablet 3   apixaban (ELIQUIS) 5 MG TABS tablet Take 1 tablet (5 mg total) by mouth 2 (two) times daily. 60 tablet 3   cetirizine (ZYRTEC) 10 MG tablet Take 10 mg by mouth daily.     lisinopril (ZESTRIL) 10 MG tablet Take 10 mg by mouth daily.     metoprolol succinate (TOPROL-XL) 50 MG 24 hr tablet Take 1/2 tablet ( 25 mg ) twice a day     metoprolol tartrate (LOPRESSOR) 25 MG tablet Take 1 tablet by mouth every 8 hours as needed for afib HR over 120 30 tablet 1   nitroGLYCERIN (NITROSTAT) 0.4 MG SL tablet Place 1 tablet (0.4 mg total) under the tongue every 5 (five) minutes as needed for chest pain. 25 tablet 4   amoxicillin-clavulanate (AUGMENTIN) 875-125 MG tablet Take 1 tablet by mouth 2 (two) times daily. (Patient not taking:  Reported on 08/23/2021)     atorvastatin (LIPITOR) 10 MG tablet Take 10 mg by mouth daily. (Patient not taking: Reported on 08/10/2021)     fluticasone (FLONASE) 50 MCG/ACT nasal spray Place 1 spray into both nostrils 2 (two) times daily. (Patient not taking: Reported on 07/19/2021)     No facility-administered medications prior to visit.     Allergies:   Trolamine salicylate   Social History   Socioeconomic History   Marital status: Divorced    Spouse name: Not on file   Number of children: Not on file   Years of education: Not on file   Highest education level: Not on file  Occupational History   Not on file  Tobacco Use   Smoking status: Never   Smokeless tobacco: Never  Vaping Use   Vaping Use: Never used  Substance and Sexual Activity   Alcohol use: Not Currently    Alcohol/week: 6.0 - 8.0 standard drinks    Types: 6 - 8 Cans of beer per week    Comment: no drink since 06/23/21   Drug use: No   Sexual activity: Not on file  Other Topics Concern   Not on file  Social History Narrative   Not on file   Social Determinants of Health   Financial Resource Strain: Not  on file  Food Insecurity: Not on file  Transportation Needs: Not on file  Physical Activity: Not on file  Stress: Not on file  Social Connections: Not on file     Family History:  The patient's ***family history includes Lung cancer in his father.   ROS General: Negative; No fevers, chills, or night sweats;  HEENT: Negative; No changes in vision or hearing, sinus congestion, difficulty swallowing Pulmonary: Negative; No cough, wheezing, shortness of breath, hemoptysis Cardiovascular: Negative; No chest pain, presyncope, syncope, palpitations GI: Negative; No nausea, vomiting, diarrhea, or abdominal pain GU: Negative; No dysuria, hematuria, or difficulty voiding Musculoskeletal: Negative; no myalgias, joint pain, or weakness Hematologic/Oncology: Negative; no easy bruising, bleeding Endocrine: Negative;  no heat/cold intolerance; no diabetes Neuro: Negative; no changes in balance, headaches Skin: Negative; No rashes or skin lesions Psychiatric: Negative; No behavioral problems, depression Sleep: Negative; No snoring, daytime sleepiness, hypersomnolence, bruxism, restless legs, hypnogognic hallucinations, no cataplexy Other comprehensive 14 point system review is negative.   PHYSICAL EXAM:   VS:  BP (!) 122/92    Pulse 68    Ht 6' (1.829 m)    Wt 276 lb 12.8 oz (125.6 kg)    SpO2 99%    BMI 37.54 kg/m    Wt Readings from Last 3 Encounters:  08/23/21 276 lb 12.8 oz (125.6 kg)  08/10/21 274 lb (124.3 kg)  07/20/21 283 lb 3.2 oz (128.5 kg)    General: Alert, oriented, no distress.  Skin: normal turgor, no rashes, warm and dry HEENT: Normocephalic, atraumatic. Pupils equal round and reactive to light; sclera anicteric; extraocular muscles intact; Fundi ** Nose without nasal septal hypertrophy Mouth/Parynx benign; Mallinpatti scale Neck: No JVD, no carotid bruits; normal carotid upstroke Lungs: clear to ausculatation and percussion; no wheezing or rales Chest wall: without tenderness to palpitation Heart: PMI not displaced, RRR, s1 s2 normal, 1/6 systolic murmur, no diastolic murmur, no rubs, gallops, thrills, or heaves Abdomen: soft, nontender; no hepatosplenomehaly, BS+; abdominal aorta nontender and not dilated by palpation. Back: no CVA tenderness Pulses 2+ Musculoskeletal: full range of motion, normal strength, no joint deformities Extremities: no clubbing cyanosis or edema, Homan's sign negative  Neurologic: grossly nonfocal; Cranial nerves grossly wnl Psychologic: Normal mood and affect   Studies/Labs Reviewed:   February 8, 2023ECG (independently read by me): NSR at 68, nonspecific T chhanges  Recent Labs: BMP Latest Ref Rng & Units 07/20/2021 07/08/2021 06/15/2021  Glucose 70 - 99 mg/dL 119(H) 107(H) 103(H)  BUN 6 - 20 mg/dL $Remove'12 12 10  'ogyKsjx$ Creatinine 0.61 - 1.24 mg/dL 0.92 1.05  1.05  BUN/Creat Ratio 9 - 20 - - -  Sodium 135 - 145 mmol/L 138 139 137  Potassium 3.5 - 5.1 mmol/L 3.9 4.0 3.8  Chloride 98 - 111 mmol/L 103 105 104  CO2 22 - 32 mmol/L $RemoveB'25 24 24  'DHgXhqaw$ Calcium 8.9 - 10.3 mg/dL 9.5 10.0 9.1     Hepatic Function Latest Ref Rng & Units 07/20/2021 06/15/2021 01/09/2018  Total Protein 6.5 - 8.1 g/dL 7.8 7.0 7.2  Albumin 3.5 - 5.0 g/dL 4.9 4.3 4.9  AST 15 - 41 U/L 36 47(H) 32  ALT 0 - 44 U/L 53(H) 65(H) 44  Alk Phosphatase 38 - 126 U/L 75 62 81  Total Bilirubin 0.3 - 1.2 mg/dL 2.3(H) 1.7(H) 1.1    CBC Latest Ref Rng & Units 07/20/2021 07/08/2021 06/07/2021  WBC 4.0 - 10.5 K/uL 8.0 8.1 9.4  Hemoglobin 13.0 - 17.0 g/dL 17.1(H) 17.2(H) 18.5(H)  Hematocrit 39.0 - 52.0 % 48.0 48.5 52.5(H)  Platelets 150 - 400 K/uL 233 255 241   Lab Results  Component Value Date   MCV 90.6 07/20/2021   MCV 90.5 07/08/2021   MCV 90.8 06/07/2021   Lab Results  Component Value Date   TSH 1.603 07/08/2021   No results found for: HGBA1C   BNP No results found for: BNP  ProBNP No results found for: PROBNP   Lipid Panel     Component Value Date/Time   CHOL 216 (H) 01/09/2018 0951   TRIG 91 01/09/2018 0951   HDL 55 01/09/2018 0951   CHOLHDL 3.9 01/09/2018 0951   LDLCALC 143 (H) 01/09/2018 0951   LABVLDL 18 01/09/2018 0951     RADIOLOGY: LONG TERM MONITOR (3-14 DAYS)  Result Date: 08/08/2021 Patch Wear Time:  13 days and 19 hours Predominant underlying rhythm was sinus rhythm Multiple SVT episodes, all less than 18 seconds 21% atrial fibrillation/flutter burden Less than 1% ventricular and supraventricular ectopy Triggered events associated with atrial fibrillation/flutter and PACs Will Camnitz, MD    Additional studies/ records that were reviewed today include:  ***    ASSESSMENT:    No diagnosis found.   PLAN:  ***   Medication Adjustments/Labs and Tests Ordered: Current medicines are reviewed at length with the patient today.  Concerns regarding  medicines are outlined above.  Medication changes, Labs and Tests ordered today are listed in the Patient Instructions below. There are no Patient Instructions on file for this visit.   Signed, Shelva Majestic, MD  08/23/2021 5:13 PM    [facility] [address], [place], [place], [place]  [address] Phone: [phone]

## 2021-08-24 ENCOUNTER — Other Ambulatory Visit: Payer: Self-pay

## 2021-08-24 ENCOUNTER — Emergency Department (HOSPITAL_BASED_OUTPATIENT_CLINIC_OR_DEPARTMENT_OTHER): Payer: Managed Care, Other (non HMO) | Admitting: Radiology

## 2021-08-24 ENCOUNTER — Emergency Department (HOSPITAL_BASED_OUTPATIENT_CLINIC_OR_DEPARTMENT_OTHER)
Admission: EM | Admit: 2021-08-24 | Discharge: 2021-08-24 | Disposition: A | Discharge status: Home / Self Care | Payer: Managed Care, Other (non HMO) | Attending: Emergency Medicine | Admitting: Emergency Medicine

## 2021-08-24 ENCOUNTER — Telehealth: Payer: Self-pay | Admitting: Cardiovascular Disease

## 2021-08-24 DIAGNOSIS — R079 Chest pain, unspecified: Secondary | ICD-10-CM | POA: Diagnosis present

## 2021-08-24 DIAGNOSIS — I480 Paroxysmal atrial fibrillation: Secondary | ICD-10-CM | POA: Diagnosis not present

## 2021-08-24 DIAGNOSIS — Z7901 Long term (current) use of anticoagulants: Secondary | ICD-10-CM | POA: Insufficient documentation

## 2021-08-24 LAB — TROPONIN I (HIGH SENSITIVITY)
Troponin I (High Sensitivity): <2 ng/L (ref <18)
Troponin I (High Sensitivity): <2 ng/L (ref <18)

## 2021-08-24 LAB — BASIC METABOLIC PANEL
Anion gap: 12 (ref 5–15)
BUN: 9 mg/dL (ref 6–20)
CO2: 22 mmol/L (ref 22–32)
Calcium: 9.9 mg/dL (ref 8.9–10.3)
Chloride: 107 mmol/L (ref 98–111)
Creatinine, Ser: 1.11 mg/dL (ref 0.61–1.24)
GFR, Estimated: >60 mL/min (ref >60)
Glucose, Bld: 117 mg/dL — ABNORMAL HIGH (ref 70–99)
Potassium: 4 mmol/L (ref 3.5–5.1)
Sodium: 141 mmol/L (ref 135–145)

## 2021-08-24 LAB — CBC
HCT: 50.6 % (ref 39.0–52.0)
Hemoglobin: 17.5 g/dL — ABNORMAL HIGH (ref 13.0–17.0)
MCH: 31.3 pg (ref 26.0–34.0)
MCHC: 34.6 g/dL (ref 30.0–36.0)
MCV: 90.4 fL (ref 80.0–100.0)
Platelets: 232 10*3/uL (ref 150–400)
RBC: 5.6 MIL/uL (ref 4.22–5.81)
RDW: 12 % (ref 11.5–15.5)
WBC: 6.1 10*3/uL (ref 4.0–10.5)
nRBC: 0 % (ref 0.0–0.2)

## 2021-08-24 NOTE — Telephone Encounter (Signed)
Patient c/o Palpitations:  High priority if patient c/o lightheadedness, shortness of breath, or chest pain  How long have you had palpitations/irregular HR/ Afib? Are you having the symptoms now? Started this morning, is having symptoms now   Are you currently experiencing lightheadedness, SOB or CP? Lightheadedness   Do you have a history of afib (atrial fibrillation) or irregular heart rhythm? Yes   Have you checked your BP or HR? (document readings if available): No   Are you experiencing any other symptoms? Sweating, nausea, & states he just doesn't feel good   Transferred to triage due to being STAT.

## 2021-08-24 NOTE — ED Triage Notes (Signed)
Pt complains of chest pain and SOB started this morning, nausea no vomiting. Hx of afib felt like heart was fluttering this morning. Also complains of sore throat.

## 2021-08-24 NOTE — Telephone Encounter (Signed)
Patient called in to triage to report going into afib this morning. He reports nausea, palpations, and sweating. He pulse ox is P 75 and O2 sat 96. Does not have BP cuff with him. Based on symptoms, reccommended he have someone drive him to the ED or call 911. Pt voiced understanding of this conversation.

## 2021-08-25 ENCOUNTER — Encounter: Payer: Self-pay | Admitting: Cardiology

## 2021-08-25 ENCOUNTER — Telehealth: Payer: Self-pay | Admitting: Cardiology

## 2021-08-25 ENCOUNTER — Telehealth: Payer: Self-pay | Admitting: Cardiovascular Disease

## 2021-08-25 NOTE — Telephone Encounter (Signed)
Pt advised to decrease Amiodarone to original once daily as scheduled today. He did go to [facility] urgent care yesterday but was in sinus rhythm by the time he got there. States he did have an irregular pulse yesterday and today, but states it did not go over 100 that he knows of. He is light headed/nauseous/cp/sob/feels terrible when episode occurs.   Denies syncope/edema/wt gain. BP 123/85. HR 60s  Aware I will forward to Dr. Elberta Fortis for review/advisement. Aware he may want to load Amiodarone a little longer vs different medication all together, and/or may want a monitor to confirm. Pt aware I will follow up next week to give Camnitz recommendation/s. Advised to call after hours/over weekend if needed. Patient verbalized understanding and agreeable to plan.

## 2021-08-25 NOTE — Telephone Encounter (Signed)
Patient was calling in to let DR kelly know he was in the hospital. And would like to get call back in regards to it. Please advise

## 2021-08-25 NOTE — Telephone Encounter (Signed)
Spoke with patient who reports going to the ED yesterday for palpitations. He stated he converted on his own, but has nausea and "heartburn". He was told not to take Pepcid because it interferes with his other medications. He would like something for nausea and reflux. He also wants to know if he should take 400 mg of amiodarone or stay at 200 mg, which he starts today.

## 2021-08-25 NOTE — Telephone Encounter (Signed)
Patient seen in ED.  Thanks!

## 2021-08-25 NOTE — Telephone Encounter (Signed)
New Message:     Patient said he had to go to [place] at [facility] yesterday for Afib. He wants to know what to do about his Amiodarone. He said he was supposed to cut back on it today.Marland Kitchen He needs to know how to take it at this point?    Pt c/o medication issue:  1. Name of Medication: Amiodarone  2. How are you currently taking this medication (dosage and times per day)? 1 2 times a day  3. Are you having a reaction (difficulty breathing--STAT)?   4. What is your medication issue? Need to know if he needs to continue taking it 2 times a day or 1 time a day?

## 2021-08-25 NOTE — ED Provider Notes (Signed)
[facility] EMERGENCY DEPT Provider Note   CSN: [id number] Arrival date & time: 08/24/21  1230     History  Chief Complaint  Patient presents with   Chest Pain    Joshua Simpson is a 51 y.o. male.   Chest Pain Associated symptoms: shortness of breath   Associated symptoms: no abdominal pain, no back pain and no weakness   Patient presents with chest pain.  Began this morning.  Nausea without vomiting.  History of same.  Has had previous heart catheterization that showed no large vessel disease that was around 4 years ago.  Also has episodic A-fib.  Not on anticoagulation due to low CHA2DS2-VASc score.  States he went into A-fib earlier today.  Now longer in it.  Is due for an ablation within the next month.  States he feels bad when he goes into A-fib.  Has not had any exertional pain.  Has been able to do his normal physical activity otherwise.  Did have shortness of breath with the episode    Home Medications Prior to Admission medications   Medication Sig Start Date End Date Taking? Authorizing Provider  ALPRAZolam Prudy Feeler) 0.5 MG tablet Take 0.5 mg by mouth daily as needed. 07/06/21   [provider]  amiodarone (PACERONE) 200 MG tablet Take 2 tablets (400 mg total) TWICE daily for 2 weeks, then reduce and take 1 tablet (200 mg total) ONCE daily 08/10/21   Camnitz, Andree Coss, MD  apixaban (ELIQUIS) 5 MG TABS tablet Take 1 tablet (5 mg total) by mouth 2 (two) times daily. 08/10/21   Camnitz, Andree Coss, MD  cetirizine (ZYRTEC) 10 MG tablet Take 10 mg by mouth daily.    [provider]  lisinopril (ZESTRIL) 10 MG tablet Take 1 tablet (10 mg total) by mouth daily. 08/23/21 11/21/21  Lennette Bihari, MD  metoprolol succinate (TOPROL-XL) 50 MG 24 hr tablet Take 1/2 tablet ( 25 mg ) twice a day 07/27/21   Fenton, Clint R, PA  metoprolol tartrate (LOPRESSOR) 25 MG tablet Take 1 tablet by mouth every 8 hours as needed for afib HR over 120 07/19/21   Fenton, Clint  R, PA  nitroGLYCERIN (NITROSTAT) 0.4 MG SL tablet Place 1 tablet (0.4 mg total) under the tongue every 5 (five) minutes as needed for chest pain. 08/10/21 11/08/21  Camnitz, Andree Coss, MD  rosuvastatin (CRESTOR) 20 MG tablet Take 1 tablet (20 mg total) by mouth daily. 08/23/21 11/21/21  Lennette Bihari, MD      Allergies    Trolamine salicylate    Review of Systems   Review of Systems  Constitutional:  Negative for appetite change.  Respiratory:  Positive for shortness of breath.   Cardiovascular:  Positive for chest pain.  Gastrointestinal:  Negative for abdominal pain.  Genitourinary:  Negative for flank pain.  Musculoskeletal:  Negative for back pain.  Neurological:  Negative for weakness.   Physical Exam Updated Vital Signs BP 123/90    Pulse 62    Temp 97.9 F (36.6 C) (Temporal)    Resp 14    Ht 6' (1.829 m)    Wt 124.7 kg    SpO2 96%    BMI 37.30 kg/m  Physical Exam Vitals and nursing note reviewed.  Cardiovascular:     Rate and Rhythm: Normal rate and regular rhythm.  Chest:     Chest wall: No tenderness.  Abdominal:     Tenderness: There is no abdominal tenderness.  Musculoskeletal:  Cervical back: Neck supple.     Right lower leg: No edema.     Left lower leg: No edema.  Neurological:     Mental Status: He is alert.    ED Results / Procedures / Treatments   Labs (all labs ordered are listed, but only abnormal results are displayed) Labs Reviewed  BASIC METABOLIC PANEL - Abnormal; Notable for the following components:      Result Value   Glucose, Bld 117 (*)    All other components within normal limits  CBC - Abnormal; Notable for the following components:   Hemoglobin 17.5 (*)    All other components within normal limits  TROPONIN I (HIGH SENSITIVITY)  TROPONIN I (HIGH SENSITIVITY)    EKG EKG Interpretation  Date/Time:  Thursday August 24 2021 12:41:38 EST Ventricular Rate:  79 PR Interval:  190 QRS Duration: 86 QT Interval:  406 QTC  Calculation: 465 R Axis:   62 Text Interpretation: Normal sinus rhythm Normal ECG When compared with ECG of 20-Jul-2021 11:54, No significant change since last tracing Confirmed by Davonna Belling [phone]) on 08/24/2021 3:07:05 PM  Radiology DG Chest 2 View  Result Date: 08/24/2021 CLINICAL DATA:  Chest pain EXAM: CHEST - 2 VIEW COMPARISON:  Chest radiograph dated July 08, 2021 FINDINGS: The heart size and mediastinal contours are within normal limits. Both lungs are clear. The visualized skeletal structures are unremarkable. IMPRESSION: No active cardiopulmonary disease. Electronically Signed   By: Keane Police D.O.   On: 08/24/2021 13:19    Procedures Procedures    Medications Ordered in ED Medications - No data to display  ED Course/ Medical Decision Making/ A&P                           Medical Decision Making Problems Addressed: Nonspecific chest pain: acute illness or injury Paroxysmal atrial fibrillation ([place]): chronic illness or injury  Amount and/or Complexity of Data Reviewed External Data Reviewed: notes.    Details: Cardiology note. Labs: ordered. Radiology: ordered and independent interpretation performed. Decision-making details documented in ED Course. ECG/medicine tests: independent interpretation performed. Decision-making details documented in ED Course.   Patient with chest pain.  Initial differential diagnosis is long and includes coronary artery disease, arrhythmia, pneumothorax. Patient with chest pain that came on with atrial fibrillation.  History of same.  Upon arrival is back in the sinus rhythm.  No ischemic changes on EKG.  Chest x-ray independently interpreted and showed no active disease.  Hemoglobin elevated but this is chronic for the patient.  I think likely the pain was secondary to the A-fib.  Does not appear to be ischemic.  Troponin normal.  Appears stable to follow-up with cardiology as an outpatient.  Patient saw cardiologist  yesterday.        Final Clinical Impression(s) / ED Diagnoses Final diagnoses:  Nonspecific chest pain  Paroxysmal atrial fibrillation [facility])    Rx / DC Orders ED Discharge Orders     None         Davonna Belling, MD 08/25/21 [phone]

## 2021-08-28 NOTE — Telephone Encounter (Signed)
Called pt to give Dr. Curt Bears recommendations. Pt states is on currently on the way to the GI doc as we speak. He will let them know what is going on. He thanked me for calling him back.

## 2021-08-28 NOTE — Telephone Encounter (Signed)
Patient is following up, again requesting recommendations for medications that assists with nausea and acid reflux. Please advise.

## 2021-08-29 ENCOUNTER — Encounter: Payer: Self-pay | Admitting: Cardiovascular Disease

## 2021-08-29 NOTE — Progress Notes (Signed)
Patient ID: Joshua Simpson, male   DOB: 1971/01/30, 51 y.o.   MRN: [id number]     Primary MD: Joshua Jump, NP Cardiology: Joshua Majestic MD EP: Dr. Curt Simpson   Follow-up evaluation.  I last saw the patient on June 17, 2018  HPI:  Joshua Simpson has a history of hypertension and in 2011 experience atypical chest pain.  He was evaluated by Dr. Sallyanne Simpson initially in  2011.  His chest pain was felt to be atypical and not exertionally related and most likely musculoskeletal.  It was felt that he had anxiety driven, sinus tachycardia and significant cardiac deconditioning.  His last cardiac evaluation was 5 years ago by Dr. Ellyn Simpson.  The patient has issues with anxiety.  Over the last 3 weeks.  He is experiencing increased episodes of shortness of breath.  He denies chest pain.  He does have hypertension.  He has been awakened from sleep, particularly around 5 AM with his heart pounding.  He admits to very poor sleep.  His sleep is nonrestorative.  He does note some daytime sleepiness.  He snores and at times has awakened gasping for breath.  He has taken Xanax in the past for anxiety.  He has GERD for which he has taken pantoprazole.  He had recently been out of work after surgery which her surgery for [employer].  Because of his increasing symptoms associated with increased blood pressure, he was referred for cardiology evaluation.  When I saw him initially he had significant hypertension and his blood pressure was 150/110.  He had been on low-dose atenolol and I increased this to 25 mg in the morning and 12.5 mg in the evening.  I also started him on lisinopril at 5 mg daily.  He underwent an echo Doppler study which was done on 09/21/2015.  This showed an ejection fraction of 60-65% without wall motion abnormalities.  There was grade 2 diastolic dysfunction.  His aortic root was upper normal at 37 mm.  There was trivial MR.  When I saw him in April 2017 his blood pressure had stabilized and  typically runs in the 937 systolic range and in the 34K in the diastolic range at home.  He denied chest pain.  He does have anxiety issues and has taking alprazolam intermittently.  He has history of GERD for which he has been on pantoprazole.  I was concerned with sleep apnea but due to his lack of insurance he wished to defer evaluation.  He developed intermittent episodes of chest pain with atypical features which led to an emergency room evaluation February 2018.  Troponins were negative.  He did not have acute ECG changes, but he did have nonspecific lateral T-wave abnormality.  Chest x-ray did not reveal any significant abnormality.    When I saw him in May 2018 he had undergone a routine treadmill test which was negative for ischemia. He had been doing fairly well until February 2019 and he developed palpitations as well as episodes of chest discomfort episodes of awakening at night with his heart pounding and not sleeping well.  In the past I had recommended a sleep evaluation but he did decided against this.  He was seen by Joshua Sims, NP for a 14-day monitor.  This revealed predominant sinus rhythm with an average rate at 60 bpm.  There were periods of sinus bradycardia, isolated PACs, and rare episodes of atrial couplets.  There was transient first-degree AV block.  There was no ventricular ectopy or  episodes of atrial fibrillation or significant pauses.  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  He had undergone cholecystectomy in January 2019.  Recently, he admits to shortness of breath with activity and admits to perspiring significantly.  He notes that sharp atypical chest pain which is nonexertional and short-lived.    He was seen by Joshua Simpson on May 15, 2018 and at that time was having worsening chest pain both at rest and with exertion over 2 months previously.  He was referred for definitive cardiac catheterization which I  performed on May 26, 2018.  He was found to have low normal LV function with EF estimated 50%.  LVEDP was increased to 22 mm.  He did not have focal segmental wall motion abnormalities.  He had normal epicardial coronary arteries.  There is a she did not identify any coronary obstructive disease.  If he continues to experience exertional chest pressure cardiopulmonary met testing to assess for possible microvascular angina was recommended.  Also an outpatient echo Doppler study was recommended to reassess LV systolic and diastolic function.  I last saw him on June 17, 2018 at which time he felt well and denied any recurrent chest pain.  However, the previous Sunday he had Joshua Simpson and that evening noticed his heart racing and was unable to sleep.  Retrospectively, he admits that he has experienced episodes of nocturnal palpitations.  He admits to snoring.  His sleep is nonrestorative.  He admits to daytime fatigue.  At times he also has noticed some mild ankle swelling particularly since with his new job he must wear boots to work.  During that evaluation, I recommended that he discontinue atenolol and changed to metoprolol succinate 50 mg daily.  He was on lisinopril 5 mg and had recently been started on rosuvastatin for LDL cholesterol of 143 and when he was seen by Joshua Simpson this was changed to pravastatin.  In the past I had recommended that he undergo a sleep study for high level of suspicion for obstructive sleep apnea which may be contributing to some of his nocturnal palpitations.  Since I last saw him, he has been evaluated by Joshua Simpson following development of atrial fibrillation in November 2022.  Echo Doppler study on July 06, 2021 showed normal LV function with EF 60 to 65% with grade 2 diastolic dysfunction.  He was recently evaluated by him on July 21, 2021 after he presented to the emergency room with presyncope.  He wore a 2-week Zio patch monitor between January 6 and  August 04, 2021 which showed predominant sinus rhythm with frequent episodes of SVT with the fastest interval lasting 8 beats with a rate of 231 in the lab longest lasting 16.8 seconds at an average rate of 135.  There also were episodes of atrial fibrillation/flutter with a 21% burden with rates averaging from 51 to 2018 bpm.  He underwent a home sleep study on July 18, 2021 which revealed severe obstructive sleep apnea with an AHI of 38.7/h.  There was a significant positional component with supine sleep AHI 55.3 versus nonsupine sleep AHI at 17.2.  He had severe oxygen desaturation to a nadir of 80%.  He is still awaiting approval from insurance for his titration and if unable the recommendation was to initiate AutoPap initially at 6 to 18 cm of water.  He was not evaluated by Dr. Curt Simpson on August 10, 2021.  ECG that  day showed sinus rhythm.  However, with his 21% atrial fibrillation burden and on his monitor, it was advised that he initiate Eliquis and start amiodarone at a lengthy discussion concerning future treatment resulted in scheduling him for atrial fibrillation ablation tentatively scheduled for September 26, 2021.  Presently, he denies any chest pain.  He states his episodes of atrial fibrillation in the past typically could occur for 3 hours and then resolved.  I reviewed his sleep study which showed severe sleep apnea and I have recommended that if he cannot have in lab titration that we initiate AutoPap therapy.  I reviewed his most recent echo Doppler study which showed normal systolic function with grade 2 diastolic dysfunction.  I also reviewed laboratory from June 07, 2021 which showed total cholesterol 282 triglycerides 225 and LDL cholesterol 191.  Presently, he has been on amiodarone 200 mg daily (but previously was on twice daily for 2 weeks, Eliquis 5 mg twice a day, lisinopril 10 mg, and metoprolol succinate 25 mg twice a day.  He presents for evaluation.  Past Medical History:   Diagnosis Date   Anxiety    Atrial fibrillation ([place])    Hypertension     Past Surgical History:  Procedure Laterality Date   CHOLECYSTECTOMY N/A 08/14/2017   Procedure: LAPAROSCOPIC CHOLECYSTECTOMY WITH INTRAOPERATIVE CHOLANGIOGRAM;  Surgeon: Donnie Mesa, MD;  Location: [place];  Service: General;  Laterality: N/A;   LEFT HEART CATH AND CORONARY ANGIOGRAPHY N/A 05/26/2018   Procedure: LEFT HEART CATH AND CORONARY ANGIOGRAPHY;  Surgeon: Troy Sine, MD;  Location: [facility];  Service: Cardiovascular;  Laterality: N/A;   SHOULDER SURGERY      Allergies  Allergen Reactions   Trolamine Salicylate Rash    Current Outpatient Medications  Medication Sig Dispense Refill   ALPRAZolam (XANAX) 0.5 MG tablet Take 0.5 mg by mouth daily as needed.     amiodarone (PACERONE) 200 MG tablet Take 2 tablets (400 mg total) TWICE daily for 2 weeks, then reduce and take 1 tablet (200 mg total) ONCE daily 90 tablet 3   apixaban (ELIQUIS) 5 MG TABS tablet Take 1 tablet (5 mg total) by mouth 2 (two) times daily. 60 tablet 3   cetirizine (ZYRTEC) 10 MG tablet Take 10 mg by mouth daily.     lisinopril (ZESTRIL) 10 MG tablet Take 1 tablet (10 mg total) by mouth daily. 90 tablet 3   metoprolol succinate (TOPROL-XL) 50 MG 24 hr tablet Take 1/2 tablet ( 25 mg ) twice a day     metoprolol tartrate (LOPRESSOR) 25 MG tablet Take 1 tablet by mouth every 8 hours as needed for afib HR over 120 30 tablet 1   nitroGLYCERIN (NITROSTAT) 0.4 MG SL tablet Place 1 tablet (0.4 mg total) under the tongue every 5 (five) minutes as needed for chest pain. 25 tablet 4   rosuvastatin (CRESTOR) 20 MG tablet Take 1 tablet (20 mg total) by mouth daily. 90 tablet 3   No current facility-administered medications for this visit.    Social History   Socioeconomic History   Marital status: Divorced    Spouse name: Not on file   Number of children: Not on file   Years of education: Not on file   Highest education  level: Not on file  Occupational History   Not on file  Tobacco Use   Smoking status: Never   Smokeless tobacco: Never  Vaping Use   Vaping Use: Never used  Substance and Sexual  Activity   Alcohol use: Not Currently    Alcohol/week: 6.0 - 8.0 standard drinks    Types: 6 - 8 Cans of beer per week    Comment: no drink since 06/23/21   Drug use: No   Sexual activity: Not on file  Other Topics Concern   Not on file  Social History Narrative   Not on file   Social Determinants of Health   Financial Resource Strain: Not on file  Food Insecurity: Not on file  Transportation Needs: Not on file  Physical Activity: Not on file  Stress: Not on file  Social Connections: Not on file  Intimate Partner Violence: Not on file   Social history is notable that he is married for 18 years.  He has one child.  He works at the [employer].  He completed 11th grade of education.  There is no history of tobacco use.  He does not routinely exercise but does walk occasionally.  Family History  Problem Relation Age of Onset   Lung cancer Father    Family history is notable that both parents are living at age 34.  Mother has issues with anxiety.  Father had cancer of his kidney.  He is one brother age 38 and 1 sister, age 50.  His child is age 52.  ROS General: Negative; No fevers, chills, or night sweats HEENT: Negative; No changes in vision or hearing, sinus congestion, difficulty swallowing Pulmonary: Negative; No cough, wheezing, shortness of breath, hemoptysis Cardiovascular:  See HPI;  GI: Negative; No nausea, vomiting, diarrhea, or abdominal pain GU: Negative; No dysuria, hematuria, or difficulty voiding Musculoskeletal:Status post right rotator cuff surgery. Hematologic/Oncologic: Negative; no easy bruising, bleeding Endocrine: Negative; no heat/cold intolerance; no diabetes Neuro: Negative; no changes in balance, headaches Skin: Negative; No rashes or skin lesions Psychiatric:  Positive for anxiety Sleep: Intermittent poor sleep; nohypersomnolence, bruxism, restless legs, hypnogagnic hallucinations Other comprehensive 14 point system review is negative   Physical Exam BP (!) 122/92    Pulse 68    Ht 6' (1.829 m)    Wt 276 lb 12.8 oz (125.6 kg)    SpO2 99%    BMI 37.54 kg/m    Repeat blood pressure by me was 128/88  Wt Readings from Last 3 Encounters:  08/24/21 275 lb (124.7 kg)  08/23/21 276 lb 12.8 oz (125.6 kg)  08/10/21 274 lb (124.3 kg)    General: Alert, oriented, no distress.  Mesomorphic body habitus Skin: normal turgor, no rashes, warm and dry HEENT: Normocephalic, atraumatic. Pupils equal round and reactive to light; sclera anicteric; extraocular muscles intact; Nose without nasal septal hypertrophy Mouth/Parynx benign; Mallinpatti scale 3/4 Neck: No JVD, no carotid bruits; normal carotid upstroke Lungs: clear to ausculatation and percussion; no wheezing or rales Chest wall: without tenderness to palpitation Heart: PMI not displaced, RRR, s1 s2 normal, 1/6 systolic murmur, no diastolic murmur, no rubs, gallops, thrills, or heaves Abdomen: soft, nontender; no hepatosplenomehaly, BS+; abdominal aorta nontender and not dilated by palpation. Back: no CVA tenderness Pulses 2+ Musculoskeletal: full range of motion, normal strength, no joint deformities Extremities: no clubbing cyanosis or edema, Homan's sign negative  Neurologic: grossly nonfocal; Cranial nerves grossly wnl Psychologic: Normal mood and affect   August 23, 2021 ECG (independently read by me): Normal sinus rhythm at 68 bpm, nonspecific T changes.  December 2019 ECG (independently read by me): Normal sinus rhythm at 75 bpm, occasional QTc interval 464 ms.  PR interval 196 ms.  PAC  June 2019 ECG (independently read by me): Sinus rhythm at 67 bpm.  Normal intervals.  No ectopy.  No ST segment changes.  May 2018 ECG (independently read by me): Normal sinus rhythm at 66 bpm.  QTc  interval 425 ms, PR interval 192 ms.  No significant ST-T changes.  Mild nondiagnostic T change in lead 3.  April 2017 ECG (independently read by me): Normal sinus rhythm at 63 bpm.  Normal intervals.  January 2017 ECG (independently read by me): Normal sinus rhythm at 67 bpm.  QTc interval 452 ms.  No significant ST-T changes  LABS:  BMP Latest Ref Rng & Units 08/24/2021 07/20/2021 07/08/2021  Glucose 70 - 99 mg/dL 117(H) 119(H) 107(H)  BUN 6 - 20 mg/dL _0 Creatinine 0.61 - 1.24 mg/dL 1.11 0.92 1.05  BUN/Creat Ratio 9 - 20 - - -  Sodium 135 - 145 mmol/L 141 138 139  Potassium 3.5 - 5.1 mmol/L 4.0 3.9 4.0  Chloride 98 - 111 mmol/L 107 103 105  CO2 22 - 32 mmol/L _1 Calcium 8.9 - 10.3 mg/dL 9.9 9.5 10.0    Hepatic Function Latest Ref Rng & Units 07/20/2021 06/15/2021 01/09/2018  Total Protein 6.5 - 8.1 g/dL 7.8 7.0 7.2  Albumin 3.5 - 5.0 g/dL 4.9 4.3 4.9  AST 15 - 41 U/L 36 47(H) 32  ALT 0 - 44 U/L 53(H) 65(H) 44  Alk Phosphatase 38 - 126 U/L 75 62 81  Total Bilirubin 0.3 - 1.2 mg/dL 2.3(H) 1.7(H) 1.1    CBC Latest Ref Rng & Units 08/24/2021 07/20/2021 07/08/2021  WBC 4.0 - 10.5 K/uL 6.1 8.0 8.1  Hemoglobin 13.0 - 17.0 g/dL 17.5(H) 17.1(H) 17.2(H)  Hematocrit 39.0 - 52.0 % 50.6 48.0 48.5  Platelets 150 - 400 K/uL 232 233 255   Lab Results  Component Value Date   MCV 90.4 08/24/2021   MCV 90.6 07/20/2021   MCV 90.5 07/08/2021   Lab Results  Component Value Date   TSH 1.603 07/08/2021   No results found for: HGBA1C   BNP No results found for: BNP  ProBNP No results found for: PROBNP   Lipid Panel     Component Value Date/Time   CHOL 216 (H) 01/09/2018 0951   TRIG 91 01/09/2018 0951   HDL 55 01/09/2018 0951   CHOLHDL 3.9 01/09/2018 0951   LDLCALC 143 (H) 01/09/2018 0951    RADIOLOGY: DG Chest 2 View  Result Date: 08/24/2021 CLINICAL DATA:  Chest pain EXAM: CHEST - 2 VIEW COMPARISON:  Chest radiograph dated July 08, 2021 FINDINGS: The heart size and  mediastinal contours are within normal limits. Both lungs are clear. The visualized skeletal structures are unremarkable. IMPRESSION: No active cardiopulmonary disease. Electronically Signed   By: Keane Police D.O.   On: 08/24/2021 13:19   LONG TERM MONITOR (3-14 DAYS)  Result Date: 08/08/2021 Patch Wear Time:  13 days and 19 hours Predominant underlying rhythm was sinus rhythm Multiple SVT episodes, all less than 18 seconds 21% atrial fibrillation/flutter burden Less than 1% ventricular and supraventricular ectopy Triggered events associated with atrial fibrillation/flutter and PACs Will Camnitz, MD    ECHO: 07/06/2021 IMPRESSIONS   1. Left ventricular ejection fraction, by estimation, is 60 to 65%. The  left ventricle has normal function. The left ventricle has no regional  wall motion abnormalities. Left ventricular diastolic parameters are  consistent with Grade II diastolic  dysfunction (pseudonormalization).   2. Right ventricular systolic function is normal. The  right ventricular  size is normal.   3. The mitral valve is normal in structure. Trivial mitral valve  regurgitation. No evidence of mitral stenosis.   4. The aortic valve is normal in structure. Aortic valve regurgitation is  not visualized. No aortic stenosis is present.   5. The inferior vena cava is normal in size with greater than 50%  respiratory variability, suggesting right atrial pressure of 3 mmHg.    07/18/2021 CLINICAL INFORMATION Sleep Study Type: HST   Indication for sleep study: Excessive Daytime Sleepiness, Snoring, Atrial fibrillation   Epworth Sleepiness Score: 5   SLEEP STUDY TECHNIQUE A multi-channel overnight portable sleep study was performed. The channels recorded were: nasal airflow, thoracic respiratory movement, and oxygen saturation with a pulse oximetry. Snoring was also monitored.   MEDICATIONS ALPRAZolam (XANAX) 0.5 MG tablet amiodarone (PACERONE) 200 MG tablet amoxicillin-clavulanate  (AUGMENTIN) 875-125 MG tablet apixaban (ELIQUIS) 5 MG TABS tablet atorvastatin (LIPITOR) 10 MG tablet cetirizine (ZYRTEC) 10 MG tablet fluticasone (FLONASE) 50 MCG/ACT nasal spray lisinopril (ZESTRIL) 10 MG tablet metoprolol succinate (TOPROL-XL) 50 MG 24 hr tablet metoprolol tartrate (LOPRESSOR) 25 MG tablet nitroGLYCERIN (NITROSTAT) 0.4 MG SL tablet Patient self administered medications include: N/A.   SLEEP ARCHITECTURE Patient was studied for 365.5 minutes. The sleep efficiency was 100.0 % and the patient was supine for 56.7%. The arousal index was 0.0 per hour.   RESPIRATORY PARAMETERS The overall AHI was 38.7 per hour, with a central apnea index of 0 per hour.   The oxygen nadir was 80% during sleep.   CARDIAC DATA Mean heart rate during sleep was 60.6 bpm.   IMPRESSIONS - Severe obstructive sleep apnea occurred during this study (AHI 38.7/h). There is a significant positional component with supine sleep AHI 55.3/h versus nonsupine sleep AHI 17.2/h. - Severe oxygen desaturation to a nadir of 80%. - Patient snored 19.4% during the sleep.   DIAGNOSIS - Obstructive Sleep Apnea (G47.33) - Nocturnal Hypoxemia (G47.36)   RECOMMENDATIONS - In this patient with severe sleep apnea and cardiovascular comorbidities incliuding atrial fibrillation recommend an in-lab CPAP titration study. If unable for in-lab evaluation, initiate Auto-PAP at 6 - 18 cm of water. - Effort should be made to optimize nasal and oropharyngeal patency. - Positional therapy avoiding supine position during sleep. - Avoid alcohol, sedatives and other CNS depressants that may worsen sleep apnea and disrupt normal sleep architecture. - Sleep hygiene should be reviewed to assess factors that may improve sleep quality. - Weight management (BMI 37) and regular exercise should be initiated or continued. - Recommend a download and sleep clinic evaluation after one month of therapy.  IMPRESSION:  1. Paroxysmal  atrial fibrillation (HCC)   2. OSA (obstructive sleep apnea)   3. Essential hypertension   4. Hypoxemia associated with sleep   5. Mixed hyperlipidemia   6. Palpitations   7. Obesity, Class II, BMI 35-39.9      ASSESSMENT AND PLAN: [name] is a 51 year-old gentleman who has a history of obesity, as well as hypertension and anxiety.  Remotely, he had noticed episodes of increasing shortness of breath and palpitations as well as nocturnal palpitations with snoring sleeping.  A routine treadmill test was negative for ischemia but had demonstrated  systolic hypertension with stress.  A remote echo Doppler study in 2017 had shown normal systolic function with grade 2 diastolic dysfunction.  Remotely, I discussed sleep evaluation but he never pursued this in the past before he was working due to financial concerns.  He had developed increasing chest pain symptomatology led to ultimate cardiac catheterization in November 2019 and he was found o have normal epicardial coronary arteries.  LVEDP was increased to 22 mm.  LV function was low normal at 50%.  Over the last several years, he had experienced palpitations intermittently.  Ultimately he developed atrial fibrillation in November 2022.  Subsequent echo Doppler study has continued to show serve global LV contractility with EF at 60 to 65% and grade 2 diastolic dysfunction.  Cardiac monitoring has revealed bursts of short-lived SVT as well as 21% atrial fibrillation/flutter burden.  He ultimately underwent his sleep study on July 18, 2021 which confirmed my suspicion for severe obstructive sleep apnea.  Presently he is waiting approval for CPAP titration but if unable to be obtained I will initiate AutoPap therapy.  He is now scheduled to undergo atrial fibrillation ablation with Dr. Curt Simpson this is tentatively scheduled for September 26, 2021.  His blood pressure today is stable and on repeat by me was 124/85.  He has not yet started the lisinopril at 10  mg and I have recommended he initiate this instead of his previous 10 mg.  He is now on metoprolol succinate 25 mg twice a day in addition to amiodarone 20 mg daily as well as Eliquis for anticoagulation.  Lipid studies in November were significantly elevated with total cholesterol 282, triglycerides 225 and LDL 191.  I am initiating rosuvastatin 20 mg.  I had a long discussion with him regarding a Mediterranean diet for heart healthy diet.  We discussed the importance of weight loss and increased exercise.  Several months repeat laboratory will be obtained.  We will try to have CPAP be initiated prior to his planned ablation however the due to supply chain issues there typically has been a wait receive equipment.   I will see him in follow-up in approximately 3 months which will be after his ablation procedure.  Troy Sine, MD, [facility] 08/29/2021 6:02 PM

## 2021-08-30 ENCOUNTER — Telehealth (HOSPITAL_COMMUNITY): Payer: Self-pay | Admitting: *Deleted

## 2021-08-30 NOTE — Telephone Encounter (Signed)
Patient called in stating since endoscopy yesterday his HR has been irregular and elevated. HR currently 135. Pt has PRN metoprolol tartrate encouraged pt to use if still out tomorrow he will call back. Pt in agreement.

## 2021-09-04 NOTE — Telephone Encounter (Signed)
Followed up with [place].  Doing well thus far. Appreciates the follow up

## 2021-09-05 ENCOUNTER — Telehealth (HOSPITAL_COMMUNITY): Payer: Self-pay | Admitting: *Deleted

## 2021-09-05 NOTE — Telephone Encounter (Signed)
Patient has been in Af since yesterday evening his HR 80-120.  His heart rates are not staying elevated to the point to warrant PRN lopressor. Discussed with Joshua Peals PA will increase his amiodarone to 200mg  BID for 3 days then reduce back to normal dosing. Re-educated pt on loading of amiodarone and no cure of afib so breakthrough is still potential to happen. Pt reassured and will call if issues arise.

## 2021-09-15 ENCOUNTER — Other Ambulatory Visit: Payer: Managed Care, Other (non HMO) | Admitting: *Deleted

## 2021-09-15 ENCOUNTER — Other Ambulatory Visit: Payer: Self-pay

## 2021-09-15 DIAGNOSIS — I480 Paroxysmal atrial fibrillation: Secondary | ICD-10-CM

## 2021-09-15 DIAGNOSIS — G4736 Sleep related hypoventilation in conditions classified elsewhere: Secondary | ICD-10-CM

## 2021-09-15 DIAGNOSIS — E782 Mixed hyperlipidemia: Secondary | ICD-10-CM

## 2021-09-15 DIAGNOSIS — R002 Palpitations: Secondary | ICD-10-CM

## 2021-09-15 LAB — BASIC METABOLIC PANEL
BUN/Creatinine Ratio: 11 (ref 9–20)
BUN: 13 mg/dL (ref 6–24)
CO2: 21 mmol/L (ref 20–29)
Calcium: 9.6 mg/dL (ref 8.7–10.2)
Chloride: 103 mmol/L (ref 96–106)
Creatinine, Ser: 1.16 mg/dL (ref 0.76–1.27)
Glucose: 95 mg/dL (ref 70–99)
Potassium: 4.5 mmol/L (ref 3.5–5.2)
Sodium: 140 mmol/L (ref 134–144)
eGFR: 77 mL/min/{1.73_m2} (ref >59)

## 2021-09-15 LAB — HEPATIC FUNCTION PANEL
ALT: 46 IU/L — ABNORMAL HIGH (ref 0–44)
AST: 28 IU/L (ref 0–40)
Albumin: 4.7 g/dL (ref 4.0–5.0)
Alkaline Phosphatase: 83 IU/L (ref 44–121)
Bilirubin Total: 1.3 mg/dL — ABNORMAL HIGH (ref 0.0–1.2)
Bilirubin, Direct: 0.26 mg/dL (ref 0.00–0.40)
Total Protein: 6.8 g/dL (ref 6.0–8.5)

## 2021-09-15 LAB — TSH: TSH: 4.06 u[IU]/mL (ref 0.450–4.500)

## 2021-09-15 LAB — CBC
Hematocrit: 47.3 % (ref 37.5–51.0)
Hemoglobin: 16.6 g/dL (ref 13.0–17.7)
MCH: 32 pg (ref 26.6–33.0)
MCHC: 35.1 g/dL (ref 31.5–35.7)
MCV: 91 fL (ref 79–97)
Platelets: 195 10*3/uL (ref 150–450)
RBC: 5.18 x10E6/uL (ref 4.14–5.80)
RDW: 11.7 % (ref 11.6–15.4)
WBC: 5.4 10*3/uL (ref 3.4–10.8)

## 2021-09-15 LAB — LIPID PANEL
Chol/HDL Ratio: 5.1 ratio — ABNORMAL HIGH (ref 0.0–5.0)
Cholesterol, Total: 230 mg/dL — ABNORMAL HIGH (ref 100–199)
HDL: 45 mg/dL (ref >39)
LDL Chol Calc (NIH): 164 mg/dL — ABNORMAL HIGH (ref 0–99)
Triglycerides: 118 mg/dL (ref 0–149)
VLDL Cholesterol Cal: 21 mg/dL (ref 5–40)

## 2021-09-21 ENCOUNTER — Telehealth (HOSPITAL_COMMUNITY): Payer: Self-pay | Admitting: Emergency Medicine

## 2021-09-21 NOTE — Telephone Encounter (Signed)
Reaching out to patient to offer assistance regarding upcoming cardiac imaging study; pt verbalizes understanding of appt date/time, parking situation and where to check in, pre-test NPO status and medications ordered, and verified current allergies; name and call back number provided for further questions should they arise ?Rockwell Alexandria RN Navigator Cardiac Imaging ?[facility] ?[phone] office ?[phone] cell ? ?Denies iv issues ?Taking metop 2 hr prior to scan ?Arrival 100 ? ?

## 2021-09-21 NOTE — Telephone Encounter (Signed)
Attempted to call patient regarding upcoming cardiac CT appointment. °Left message on voicemail with name and callback number °Sher Shampine RN Navigator Cardiac Imaging °[facility] °[phone] Office °[phone] Cell ° °

## 2021-09-22 ENCOUNTER — Ambulatory Visit (HOSPITAL_COMMUNITY)
Admission: RE | Admit: 2021-09-22 | Discharge: 2021-09-22 | Disposition: A | Discharge status: Home / Self Care | Payer: Managed Care, Other (non HMO) | Source: Ambulatory Visit | Attending: Cardiology | Admitting: Cardiology

## 2021-09-22 ENCOUNTER — Other Ambulatory Visit: Payer: Self-pay

## 2021-09-22 DIAGNOSIS — I480 Paroxysmal atrial fibrillation: Secondary | ICD-10-CM

## 2021-09-22 MED ORDER — IOHEXOL 350 MG/ML SOLN
100.0000 mL | Freq: Once | INTRAVENOUS | Status: AC | PRN
  Administered 2021-09-22: 100 mL via INTRAVENOUS

## 2021-09-25 ENCOUNTER — Other Ambulatory Visit: Payer: Self-pay | Admitting: Internal Medicine

## 2021-09-25 NOTE — Pre-Procedure Instructions (Signed)
Instructed patient on the following items: Arrival time 0830 Nothing to eat or drink after midnight No meds AM of procedure Responsible person to drive you home and stay with you for 24 hrs  Have you missed any doses of anti-coagulant Eliquis- hasn't missed any doses   

## 2021-09-26 ENCOUNTER — Ambulatory Visit (HOSPITAL_BASED_OUTPATIENT_CLINIC_OR_DEPARTMENT_OTHER): Payer: Managed Care, Other (non HMO) | Admitting: Certified Registered Nurse Anesthetist

## 2021-09-26 ENCOUNTER — Ambulatory Visit (HOSPITAL_COMMUNITY)
Admission: RE | Disposition: A | Discharge status: Home / Self Care | Payer: Managed Care, Other (non HMO) | Source: Home / Self Care | Attending: Cardiology

## 2021-09-26 ENCOUNTER — Ambulatory Visit (HOSPITAL_COMMUNITY)
Admission: RE | Admit: 2021-09-26 | Discharge: 2021-09-26 | Disposition: A | Discharge status: Home / Self Care | Payer: Managed Care, Other (non HMO) | Attending: Cardiology | Admitting: Cardiology

## 2021-09-26 ENCOUNTER — Other Ambulatory Visit: Payer: Self-pay

## 2021-09-26 ENCOUNTER — Ambulatory Visit (HOSPITAL_COMMUNITY): Payer: Managed Care, Other (non HMO) | Admitting: Certified Registered Nurse Anesthetist

## 2021-09-26 DIAGNOSIS — I4891 Unspecified atrial fibrillation: Secondary | ICD-10-CM

## 2021-09-26 DIAGNOSIS — I480 Paroxysmal atrial fibrillation: Secondary | ICD-10-CM | POA: Insufficient documentation

## 2021-09-26 DIAGNOSIS — I10 Essential (primary) hypertension: Secondary | ICD-10-CM | POA: Insufficient documentation

## 2021-09-26 DIAGNOSIS — I483 Typical atrial flutter: Secondary | ICD-10-CM

## 2021-09-26 HISTORY — PX: ATRIAL FIBRILLATION ABLATION: EP1191

## 2021-09-26 LAB — POCT ACTIVATED CLOTTING TIME
Activated Clotting Time: 263 seconds
Activated Clotting Time: 275 seconds

## 2021-09-26 SURGERY — ATRIAL FIBRILLATION ABLATION
Anesthesia: General

## 2021-09-26 MED ORDER — ONDANSETRON HCL 4 MG/2ML IJ SOLN
INTRAMUSCULAR | Status: DC | PRN
  Administered 2021-09-26: 4 mg via INTRAVENOUS

## 2021-09-26 MED ORDER — HEPARIN SODIUM (PORCINE) 1000 UNIT/ML IJ SOLN
INTRAMUSCULAR | Status: DC | PRN
  Administered 2021-09-26: 1000 [IU] via INTRAVENOUS

## 2021-09-26 MED ORDER — SODIUM CHLORIDE 0.9 % IV SOLN: INTRAVENOUS | Status: DC

## 2021-09-26 MED ORDER — ACETAMINOPHEN 325 MG PO TABS
650.0000 mg | ORAL_TABLET | ORAL | Status: DC | PRN
  Filled 2021-09-26: qty 2

## 2021-09-26 MED ORDER — ONDANSETRON HCL 4 MG/2ML IJ SOLN: 4.0000 mg | Freq: Four times a day (QID) | INTRAMUSCULAR | Status: DC | PRN

## 2021-09-26 MED ORDER — HEPARIN (PORCINE) IN NACL 1000-0.9 UT/500ML-% IV SOLN
INTRAVENOUS | Status: AC
  Filled 2021-09-26: qty 2500

## 2021-09-26 MED ORDER — DOBUTAMINE INFUSION FOR EP/ECHO/NUC (1000 MCG/ML)
INTRAVENOUS | Status: AC
  Filled 2021-09-26: qty 250

## 2021-09-26 MED ORDER — FENTANYL CITRATE (PF) 100 MCG/2ML IJ SOLN
INTRAMUSCULAR | Status: DC | PRN
Start: 2021-09-26 — End: 2021-09-26
  Administered 2021-09-26: 100 ug via INTRAVENOUS

## 2021-09-26 MED ORDER — HEPARIN SODIUM (PORCINE) 1000 UNIT/ML IJ SOLN
INTRAMUSCULAR | Status: AC
  Filled 2021-09-26: qty 10

## 2021-09-26 MED ORDER — DOBUTAMINE INFUSION FOR EP/ECHO/NUC (1000 MCG/ML)
INTRAVENOUS | Status: DC | PRN
  Administered 2021-09-26: 20 ug/kg/min via INTRAVENOUS

## 2021-09-26 MED ORDER — SODIUM CHLORIDE 0.9% FLUSH: 3.0000 mL | INTRAVENOUS | Status: DC | PRN

## 2021-09-26 MED ORDER — LIDOCAINE 2% (20 MG/ML) 5 ML SYRINGE
INTRAMUSCULAR | Status: DC | PRN
Start: 2021-09-26 — End: 2021-09-26
  Administered 2021-09-26: 100 mg via INTRAVENOUS

## 2021-09-26 MED ORDER — PROPOFOL 10 MG/ML IV BOLUS
INTRAVENOUS | Status: DC | PRN
  Administered 2021-09-26: 160 mg via INTRAVENOUS

## 2021-09-26 MED ORDER — SUGAMMADEX SODIUM 200 MG/2ML IV SOLN
INTRAVENOUS | Status: DC | PRN
  Administered 2021-09-26: 250 mg via INTRAVENOUS

## 2021-09-26 MED ORDER — SODIUM CHLORIDE 0.9 % IV SOLN: 250.0000 mL | INTRAVENOUS | Status: DC | PRN

## 2021-09-26 MED ORDER — HEPARIN SODIUM (PORCINE) 1000 UNIT/ML IJ SOLN
INTRAMUSCULAR | Status: DC | PRN
  Administered 2021-09-26: 15000 [IU] via INTRAVENOUS
  Administered 2021-09-26 (×2): 6000 [IU] via INTRAVENOUS

## 2021-09-26 MED ORDER — DEXAMETHASONE SODIUM PHOSPHATE 10 MG/ML IJ SOLN
INTRAMUSCULAR | Status: DC | PRN
  Administered 2021-09-26: 10 mg via INTRAVENOUS

## 2021-09-26 MED ORDER — SUCCINYLCHOLINE CHLORIDE 200 MG/10ML IV SOSY
PREFILLED_SYRINGE | INTRAVENOUS | Status: DC | PRN
  Administered 2021-09-26: 140 mg via INTRAVENOUS

## 2021-09-26 MED ORDER — PHENYLEPHRINE 40 MCG/ML (10ML) SYRINGE FOR IV PUSH (FOR BLOOD PRESSURE SUPPORT)
PREFILLED_SYRINGE | INTRAVENOUS | Status: DC | PRN
  Administered 2021-09-26: 80 ug via INTRAVENOUS
  Administered 2021-09-26: 40 ug via INTRAVENOUS

## 2021-09-26 MED ORDER — MIDAZOLAM HCL 2 MG/2ML IJ SOLN
INTRAMUSCULAR | Status: DC | PRN
  Administered 2021-09-26: 2 mg via INTRAVENOUS

## 2021-09-26 MED ORDER — SODIUM CHLORIDE 0.9% FLUSH: 3.0000 mL | Freq: Two times a day (BID) | INTRAVENOUS | Status: DC

## 2021-09-26 MED ORDER — PROTAMINE SULFATE 10 MG/ML IV SOLN
INTRAVENOUS | Status: DC | PRN
  Administered 2021-09-26: 40 mg via INTRAVENOUS

## 2021-09-26 MED ORDER — ROCURONIUM BROMIDE 10 MG/ML (PF) SYRINGE
PREFILLED_SYRINGE | INTRAVENOUS | Status: DC | PRN
Start: 2021-09-26 — End: 2021-09-26
  Administered 2021-09-26: 40 mg via INTRAVENOUS

## 2021-09-26 MED ORDER — HEPARIN (PORCINE) IN NACL 2-0.9 UNITS/ML
INTRAMUSCULAR | Status: AC | PRN
  Administered 2021-09-26 (×5): 500 mL

## 2021-09-26 SURGICAL SUPPLY — 21 items
BAG SNAP BAND KOVER 36X36 (MISCELLANEOUS) ×1 IMPLANT
BLANKET WARM UNDERBOD FULL ACC (MISCELLANEOUS) ×2 IMPLANT
CATH OCTARAY 2.0 F 3-3-3-3-3 (CATHETERS) ×1 IMPLANT
CATH S CIRCA THERM PROBE 10F (CATHETERS) ×1 IMPLANT
CATH SMTCH THERMOCOOL SF DF (CATHETERS) ×1 IMPLANT
CATH SOUNDSTAR ECO 8FR (CATHETERS) ×1 IMPLANT
CATH WEB BI DIR CSDF CRV REPRO (CATHETERS) ×1 IMPLANT
CLOSURE PERCLOSE PROSTYLE (VASCULAR PRODUCTS) ×4 IMPLANT
COVER SWIFTLINK CONNECTOR (BAG) ×2 IMPLANT
KIT VERSACROSS STEERABLE 180 (KITS) ×1 IMPLANT
MAT PREVALON FULL STRYKER (MISCELLANEOUS) ×1 IMPLANT
PACK EP LATEX FREE (CUSTOM PROCEDURE TRAY) ×2
PACK EP LF (CUSTOM PROCEDURE TRAY) ×1 IMPLANT
PAD DEFIB RADIO PHYSIO CONN (PAD) ×2 IMPLANT
PATCH CARTO3 (PAD) ×1 IMPLANT
SHEATH CARTO VIZIGO SM CVD (SHEATH) ×1 IMPLANT
SHEATH PINNACLE 7F 10CM (SHEATH) ×1 IMPLANT
SHEATH PINNACLE 8F 10CM (SHEATH) ×2 IMPLANT
SHEATH PINNACLE 9F 10CM (SHEATH) ×1 IMPLANT
SHEATH PROBE COVER 6X72 (BAG) ×1 IMPLANT
TUBING SMART ABLATE COOLFLOW (TUBING) ×1 IMPLANT

## 2021-09-26 NOTE — Anesthesia Procedure Notes (Signed)
Procedure Name: Intubation ?Date/Time: 09/26/2021 10:12 AM ?Performed by: Pearson Grippe, CRNA ?Pre-anesthesia Checklist: Patient identified, Emergency Drugs available, Suction available and Patient being monitored ?Patient Re-evaluated:Patient Re-evaluated prior to induction ?Oxygen Delivery Method: Circle system utilized ?Preoxygenation: Pre-oxygenation with 100% oxygen ?Induction Type: IV induction ?Ventilation: Mask ventilation without difficulty ?Laryngoscope Size: Hyacinth Meeker and 2 ?Grade View: Grade II ?Tube type: Oral ?Tube size: 7.5 mm ?Number of attempts: 1 ?Airway Equipment and Method: Stylet and Oral airway ?Placement Confirmation: ETT inserted through vocal cords under direct vision, positive ETCO2 and breath sounds checked- equal and bilateral ?Secured at: 22 cm ?Tube secured with: Tape ?Dental Injury: Teeth and Oropharynx as per pre-operative assessment  ? ? ? ? ?

## 2021-09-26 NOTE — Anesthesia Postprocedure Evaluation (Signed)
Anesthesia Post Note ? ?Patient: Joshua Simpson ? ?Procedure(s) Performed: ATRIAL FIBRILLATION ABLATION ? ?  ? ?Patient location during evaluation: PACU ?Anesthesia Type: General ?Level of consciousness: awake and alert ?Pain management: pain level controlled ?Vital Signs Assessment: post-procedure vital signs reviewed and stable ?Respiratory status: spontaneous breathing, nonlabored ventilation, respiratory function stable and patient connected to nasal cannula oxygen ?Cardiovascular status: blood pressure returned to baseline and stable ?Postop Assessment: no apparent nausea or vomiting ?Anesthetic complications: no ? ? ?No notable events documented. ? ?Last Vitals:  ?Vitals:  ? 09/26/21 1545 09/26/21 1550  ?BP:    ?Pulse: 86 85  ?Resp: 16 18  ?Temp:    ?SpO2: 95% 93%  ?  ?Last Pain:  ?Vitals:  ? 09/26/21 1309  ?TempSrc:   ?PainSc: 0-No pain  ? ? ?  ?  ?  ?  ?  ?  ? ?Carlee Vonderhaar L Foxx Klarich ? ? ? ? ?

## 2021-09-26 NOTE — Anesthesia Preprocedure Evaluation (Signed)
Anesthesia Evaluation  ?Patient identified by MRN, date of birth, ID band ?Patient awake ? ? ? ?Reviewed: ?Allergy & Precautions, NPO status , Patient's Chart, lab work & pertinent test results ? ?Airway ?Mallampati: II ? ?TM Distance: >3 FB ? ? ? ? Dental ?  ?Pulmonary ?neg pulmonary ROS,  ?  ?breath sounds clear to auscultation ? ? ? ? ? ? Cardiovascular ?hypertension,  ?Rhythm:Regular Rate:Normal ? ? ?  ?Neuro/Psych ?negative neurological ROS ?   ? GI/Hepatic ?negative GI ROS, Neg liver ROS,   ?Endo/Other  ?negative endocrine ROS ? Renal/GU ?negative Renal ROS  ? ?  ?Musculoskeletal ? ? Abdominal ?  ?Peds ? Hematology ?  ?Anesthesia Other Findings ? ? Reproductive/Obstetrics ? ?  ? ? ? ? ? ? ? ? ? ? ? ? ? ?  ?  ? ? ? ? ? ? ? ? ?Anesthesia Physical ?Anesthesia Plan ? ?ASA: 3 ? ?Anesthesia Plan: General  ? ?Post-op Pain Management:   ? ?Induction: Intravenous ? ?PONV Risk Score and Plan: Ondansetron, Dexamethasone and Midazolam ? ?Airway Management Planned: Oral ETT ? ?Additional Equipment:  ? ?Intra-op Plan:  ? ?Post-operative Plan: Extubation in OR ? ?Informed Consent: I have reviewed the patients History and Physical, chart, labs and discussed the procedure including the risks, benefits and alternatives for the proposed anesthesia with the patient or authorized representative who has indicated his/her understanding and acceptance.  ? ? ? ?Dental advisory given ? ?Plan Discussed with: CRNA and Anesthesiologist ? ?Anesthesia Plan Comments:   ? ? ? ? ? ? ?Anesthesia Quick Evaluation ? ?

## 2021-09-26 NOTE — Transfer of Care (Signed)
Immediate Anesthesia Transfer of Care Note ? ?Patient: Joshua Simpson ? ?Procedure(s) Performed: ATRIAL FIBRILLATION ABLATION ? ?Patient Location: Cath Lab ? ?Anesthesia Type:General ? ?Level of Consciousness: awake, alert  and oriented ? ?Airway & Oxygen Therapy: Patient Spontanous Breathing and Patient connected to nasal cannula oxygen ? ?Post-op Assessment: Report given to RN and Post -op Vital signs reviewed and stable ? ?Post vital signs: Reviewed and stable ? ?Last Vitals:  ?Vitals Value Taken Time  ?BP 106/60 09/26/21 1231  ?Temp 36.5 ?C 09/26/21 1226  ?Pulse 71 09/26/21 1234  ?Resp 22 09/26/21 1234  ?SpO2 95 % 09/26/21 1234  ?Vitals shown include unvalidated device data. ? ?Last Pain:  ?Vitals:  ? 09/26/21 1226  ?TempSrc: Temporal  ?PainSc: 8   ?   ? ?  ? ?Complications: No notable events documented. ?

## 2021-09-26 NOTE — H&P (Signed)
? ?Electrophysiology Office Note ? ? ?Date:  09/26/2021  ? ?ID:  Joshua Simpson, DOB May 21, 1971, MRN [id number] ? ?PCP:  Darrol Jump, NP  ?Cardiologist:  Claiborne Billings ?Primary Electrophysiologist:  Elson Ulbrich Meredith Leeds, MD   ? ?Chief Complaint: AF ?  ?History of Present Illness: ?Joshua Simpson is a 51 y.o. male who is being seen today for the evaluation of AF at the request of No ref. provider found. Presenting today for electrophysiology evaluation. ? ?He has a history significant for hypertension and atrial fibrillation.  He was initially diagnosed with atrial fibrillation on 06/07/2021 after presenting to his PCP.  ECG at the time showed rapid atrial fibrillation and was sent to the emergency room.  He converted to sinus rhythm before leaving the emergency room.  He then presented to the emergency room with presyncope.  He did not lose consciousness.  His symptoms resolved after approximately 2 hours.  He wore a cardiac monitor which showed a 21% atrial fibrillation burden. ? ?Today, denies symptoms of palpitations, chest pain, shortness of breath, orthopnea, PND, lower extremity edema, claudication, dizziness, presyncope, syncope, bleeding, or neurologic sequela. The patient is tolerating medications without difficulties. Plan for AF ablation today.  ? ? ?Past Medical History:  ?Diagnosis Date  ? Anxiety   ? Atrial fibrillation ([place])   ? Hypertension   ? ?Past Surgical History:  ?Procedure Laterality Date  ? CHOLECYSTECTOMY N/A 08/14/2017  ? Procedure: LAPAROSCOPIC CHOLECYSTECTOMY WITH INTRAOPERATIVE CHOLANGIOGRAM;  Surgeon: Donnie Mesa, MD;  Location: [place];  Service: General;  Laterality: N/A;  ? LEFT HEART CATH AND CORONARY ANGIOGRAPHY N/A 05/26/2018  ? Procedure: LEFT HEART CATH AND CORONARY ANGIOGRAPHY;  Surgeon: Troy Sine, MD;  Location: [facility];  Service: Cardiovascular;  Laterality: N/A;  ? SHOULDER SURGERY    ? ? ? ?Current Facility-Administered Medications  ?Medication Dose Route Frequency  Provider Last Rate Last Admin  ? 0.9 %  sodium chloride infusion   Intravenous Continuous Mayley Lish, Ocie Doyne, MD      ? ? ?Allergies:   Trolamine salicylate  ? ?Social History:  The patient  reports that he has never smoked. He has never used smokeless tobacco. He reports that he does not currently use alcohol after a past usage of about 6.0 - 8.0 standard drinks per week. He reports that he does not use drugs.  ? ?Family History:  The patient's family history includes Lung cancer in his father.  ? ?ROS:  Please see the history of present illness.   Otherwise, review of systems is positive for none.   All other systems are reviewed and negative.  ? ?PHYSICAL EXAM: ?VS:  BP (!) 137/98   Pulse 74   Temp 98.3 ?F (36.8 ?C) (Oral)   Resp 16   Ht 6' (1.829 m)   Wt 124.7 kg   SpO2 96%   BMI 37.30 kg/m?  , BMI Body mass index is 37.3 kg/m?. ?GEN: Well nourished, well developed, in no acute distress  ?HEENT: normal  ?Neck: no JVD, carotid bruits, or masses ?Cardiac: RRR; no murmurs, rubs, or gallops,no edema  ?Respiratory:  clear to auscultation bilaterally, normal work of breathing ?GI: soft, nontender, nondistended, + BS ?MS: no deformity or atrophy  ?Skin: warm and dry ?Neuro:  Strength and sensation are intact ?Psych: euthymic mood, full affect ? ? ?Recent Labs: ?07/08/2021: Magnesium 2.1 ?09/15/2021: ALT 46; BUN 13; Creatinine, Ser 1.16; Hemoglobin 16.6; Platelets 195; Potassium 4.5; Sodium 140; TSH 4.060  ? ? ?Lipid  Panel  ?   ?Component Value Date/Time  ? CHOL 230 (H) 09/15/2021 1027  ? TRIG 118 09/15/2021 1027  ? HDL 45 09/15/2021 1027  ? CHOLHDL 5.1 (H) 09/15/2021 1027  ? LDLCALC 164 (H) 09/15/2021 1027  ? ? ? ?Wt Readings from Last 3 Encounters:  ?09/26/21 124.7 kg  ?08/24/21 124.7 kg  ?08/23/21 125.6 kg  ?  ? ? ?Other studies Reviewed: ?Additional studies/ records that were reviewed today include: TTE 07/06/21  ?Review of the above records today demonstrates:  ? 1. Left ventricular ejection fraction, by  estimation, is 60 to 65%. The  ?left ventricle has normal function. The left ventricle has no regional  ?wall motion abnormalities. Left ventricular diastolic parameters are  ?consistent with Grade II diastolic  ?dysfunction (pseudonormalization).  ? 2. Right ventricular systolic function is normal. The right ventricular  ?size is normal.  ? 3. The mitral valve is normal in structure. Trivial mitral valve  ?regurgitation. No evidence of mitral stenosis.  ? 4. The aortic valve is normal in structure. Aortic valve regurgitation is  ?not visualized. No aortic stenosis is present.  ? 5. The inferior vena cava is normal in size with greater than 50%  ?respiratory variability, suggesting right atrial pressure of 3 mmHg.  ? ?Cardiac monitor 08/08/2021 personally reviewed ?Predominant underlying rhythm was sinus rhythm ?Multiple SVT episodes, all less than 18 seconds ?21% atrial fibrillation/flutter burden ?Less than 1% ventricular and supraventricular ectopy ?Triggered events associated with atrial fibrillation/flutter and PACs ? ? ?ASSESSMENT AND PLAN: ? ?1.  Paroxysmal atrial fibrillation: Joshua Simpson has presented today for surgery, with the diagnosis of atrial fibrillation.  The various methods of treatment have been discussed with the patient and family. After consideration of risks, benefits and other options for treatment, the patient has consented to  Procedure(s): ?Catheter ablation as a surgical intervention .  Risks include but not limited to complete heart block, stroke, esophageal damage, nerve damage, bleeding, vascular damage, tamponade, perforation, MI, and death. The patient's history has been reviewed, patient examined, no change in status, stable for surgery.  I have reviewed the patient's chart and labs.  Questions were answered to the patient's satisfaction.   ? ?Allegra Lai, MD ?09/26/2021 ?9:22 AM  ? ?

## 2021-09-26 NOTE — Discharge Instructions (Addendum)
Post procedure care instructions ?No driving for 4 days. No lifting over 5 lbs for 1 week. No vigorous or sexual activity for 1 week. You may return to work/your usual activities on 10/04/21. Keep procedure site clean & dry. If you notice increased pain, swelling, bleeding or pus, call/return!  You may shower after 24 hours, but no soaking in baths/hot tubs/pools for 1 week.  ? ? ?You have an appointment set up with the [facility].  Multiple studies have shown that being followed by a dedicated atrial fibrillation clinic in addition to the standard care you receive from your other physicians improves health. We believe that enrollment in the atrial fibrillation clinic will allow [place] to better care for you.  ? ?The phone number to the [facility] is [phone]. The clinic is staffed Monday through Friday from 8:30am to 5pm. ? ?Parking Directions: The clinic is located in the Heart and Vascular Building connected to [facility]. ?1)From [address] turn on to [address] and go to the 3rd entrance  (Heart and Vascular entrance) on the right. ?2)Look to the right for Heart &Vascular Parking Garage. ?3)A code for the entrance is required, for April is 1104.   ?4)Take the elevators to the 1st floor. Registration is in the room with the glass walls at the end of the hallway. ? ?If you have any trouble parking or locating the clinic, please don?t hesitate to call [phone].  ? ?Cardiac Ablation, Care After ? ?This sheet gives you information about how to care for yourself after your procedure. Your health care provider may also give you more specific instructions. If you have problems or questions, contact your health care provider. ?What can I expect after the procedure? ?After the procedure, it is common to have: ?Bruising around your puncture site. ?Tenderness around your puncture site. ?Skipped heartbeats. ?Tiredness (fatigue). ? ?Follow these instructions at  home: ?Puncture site care  ?Follow instructions from your health care provider about how to take care of your puncture site. Make sure you: ?If present, leave stitches (sutures), skin glue, or adhesive strips in place. These skin closures may need to stay in place for up to 2 weeks. If adhesive strip edges start to loosen and curl up, you may trim the loose edges. Do not remove adhesive strips completely unless your health care provider tells you to do that. ?If a large square bandage is present, this may be removed 24 hours after surgery.  ?Check your puncture site every day for signs of infection. Check for: ?Redness, swelling, or pain. ?Fluid or blood. If your puncture site starts to bleed, lie down on your back, apply firm pressure to the area, and contact your health care provider. ?Warmth. ?Pus or a bad smell. ?Driving ?Do not drive for at least 4 days after your procedure or however long your health care provider recommends. (Do not resume driving if you have previously been instructed not to drive for other health reasons.) ?Do not drive or use heavy machinery while taking prescription pain medicine. ?Activity ?Avoid activities that take a lot of effort for at least 7 days after your procedure. ?Do not lift anything that is heavier than 5 lb (4.5 kg) for one week.  ?No sexual activity for 1 week.  ?Return to your normal activities as told by your health care provider. Ask your health care provider what activities are safe for you. ?General instructions ?Take over-the-counter and prescription medicines only as told by your health care  provider. ?Do not use any products that contain nicotine or tobacco, such as cigarettes and e-cigarettes. If you need help quitting, ask your health care provider. ?You may shower after 24 hours, but Do not take baths, swim, or use a hot tub for 1 week.  ?Do not drink alcohol for 24 hours after your procedure. ?Keep all follow-up visits as told by your health care provider. This  is important. ?Contact a health care provider if: ?You have redness, mild swelling, or pain around your puncture site. ?You have fluid or blood coming from your puncture site that stops after applying firm pressure to the area. ?Your puncture site feels warm to the touch. ?You have pus or a bad smell coming from your puncture site. ?You have a fever. ?You have chest pain or discomfort that spreads to your neck, jaw, or arm. ?You are sweating a lot. ?You feel nauseous. ?You have a fast or irregular heartbeat. ?You have shortness of breath. ?You are dizzy or light-headed and feel the need to lie down. ?You have pain or numbness in the arm or leg closest to your puncture site. ?Get help right away if: ?Your puncture site suddenly swells. ?Your puncture site is bleeding and the bleeding does not stop after applying firm pressure to the area. ?These symptoms may represent a serious problem that is an emergency. Do not wait to see if the symptoms will go away. Get medical help right away. Call your local emergency services (911 in the [place] Do not drive yourself to the hospital. ?Summary ?After the procedure, it is normal to have bruising and tenderness at the puncture site in your groin, neck, or forearm. ?Check your puncture site every day for signs of infection. ?Get help right away if your puncture site is bleeding and the bleeding does not stop after applying firm pressure to the area. This is a medical emergency. ?This information is not intended to replace advice given to you by your health care provider. Make sure you discuss any questions you have with your health care provider. ?  ?

## 2021-09-27 ENCOUNTER — Encounter (HOSPITAL_COMMUNITY): Payer: Self-pay | Admitting: Cardiology

## 2021-09-27 ENCOUNTER — Other Ambulatory Visit: Payer: Self-pay

## 2021-09-27 MED ORDER — METOPROLOL SUCCINATE ER 50 MG PO TB24: 25.0000 mg | ORAL_TABLET | Freq: Two times a day (BID) | ORAL | 0 refills | Status: DC

## 2021-09-27 MED FILL — Heparin Sod (Porcine)-NaCl IV Soln 1000 Unit/500ML-0.9%: INTRAVENOUS | Qty: 2500 | Status: AC

## 2021-09-27 NOTE — Progress Notes (Signed)
Sent in refill for metoprolol 

## 2021-10-24 ENCOUNTER — Encounter (HOSPITAL_COMMUNITY): Payer: Self-pay | Admitting: Physician Assistant

## 2021-10-24 ENCOUNTER — Ambulatory Visit (HOSPITAL_COMMUNITY)
Admission: RE | Admit: 2021-10-24 | Discharge: 2021-10-24 | Disposition: A | Discharge status: Home / Self Care | Payer: Managed Care, Other (non HMO) | Source: Ambulatory Visit | Attending: Physician Assistant | Admitting: Physician Assistant

## 2021-10-24 VITALS — BP 118/86 | HR 76 | Ht 72.0 in | Wt 282.4 lb

## 2021-10-24 DIAGNOSIS — E669 Obesity, unspecified: Secondary | ICD-10-CM | POA: Diagnosis not present

## 2021-10-24 DIAGNOSIS — Z79899 Other long term (current) drug therapy: Secondary | ICD-10-CM | POA: Insufficient documentation

## 2021-10-24 DIAGNOSIS — Z6838 Body mass index (BMI) 38.0-38.9, adult: Secondary | ICD-10-CM | POA: Insufficient documentation

## 2021-10-24 DIAGNOSIS — I480 Paroxysmal atrial fibrillation: Secondary | ICD-10-CM | POA: Diagnosis present

## 2021-10-24 DIAGNOSIS — Z7901 Long term (current) use of anticoagulants: Secondary | ICD-10-CM | POA: Insufficient documentation

## 2021-10-24 DIAGNOSIS — G4733 Obstructive sleep apnea (adult) (pediatric): Secondary | ICD-10-CM | POA: Diagnosis not present

## 2021-10-24 DIAGNOSIS — I10 Essential (primary) hypertension: Secondary | ICD-10-CM | POA: Diagnosis not present

## 2021-10-24 DIAGNOSIS — I4892 Unspecified atrial flutter: Secondary | ICD-10-CM | POA: Insufficient documentation

## 2021-10-24 NOTE — Progress Notes (Signed)
? ? ?Primary Care Physician: No primary care provider on file. ?Primary Cardiologist: Dr Claiborne Billings ?Primary Electrophysiologist: Dr Curt Bears ?Referring Physician: [facility] ? ? ?Joshua Simpson is a 51 y.o. male with a history of HTN, OSA, and atrial fibrillation who presents for follow up in the [facility].  The patient was initially diagnosed with atrial fibrillation 06/07/21 after presenting to his PCP with sinus congestion. ECG showed rapid afib and he was sent to the ED. He was discharged in rate controlled afib. He has a long history of PACs and palpitations. Patient has a CHADS2VASC score of 1. He converted to SR after leaving the ED. He drinks alcohol occasionally but does report snoring and daytime somnolence. He has been diagnosed with severe OSA. He was seen by Dr Curt Bears and started on amiodarone as a bridge to ablation. He underwent afib and flutter ablation with Dr Curt Bears on 09/26/21. ? ?On follow up today, patient reports that he has done well post ablation. He denies any interim episodes of afib. He denies CP or swallowing issues. He did have some groin pain but this has resolved. He is now on CPAP for his OSA.  ? ?Today, he denies symptoms of palpitations, chest pain, shortness of breath, orthopnea, PND, lower extremity edema, presyncope, syncope, bleeding, or neurologic sequela. The patient is tolerating medications without difficulties and is otherwise without complaint today.  ? ? ?Atrial Fibrillation Risk Factors: ? ?he does have symptoms or diagnosis of sleep apnea. ?he is compliant with CPAP therapy.  ?he does not have a history of rheumatic fever. ?he does have a history of alcohol use. ?The patient does have a history of early familial atrial fibrillation or other arrhythmias. Mother, father, sister have afib. ? ?he has a BMI of Body mass index is 38.3 kg/m?Marland KitchenMarland Kitchen ?Filed Weights  ? 10/24/21 1142  ?Weight: 128.1 kg  ? ? ? ?Family History  ?Problem Relation Age of Onset   ? Lung cancer Father   ? ? ? ?Atrial Fibrillation Management history: ? ?Previous antiarrhythmic drugs: flecainide, amiodarone   ?Previous cardioversions: none ?Previous ablations: 09/26/21 ?CHADS2VASC score: 1 ?Anticoagulation history: Eliquis ? ? ?Past Medical History:  ?Diagnosis Date  ? Anxiety   ? Atrial fibrillation ([place])   ? Hypertension   ? ?Past Surgical History:  ?Procedure Laterality Date  ? ATRIAL FIBRILLATION ABLATION N/A 09/26/2021  ? Procedure: ATRIAL FIBRILLATION ABLATION;  Surgeon: Constance Haw, MD;  Location: [facility];  Service: Cardiovascular;  Laterality: N/A;  ? CHOLECYSTECTOMY N/A 08/14/2017  ? Procedure: LAPAROSCOPIC CHOLECYSTECTOMY WITH INTRAOPERATIVE CHOLANGIOGRAM;  Surgeon: Donnie Mesa, MD;  Location: [facility];  Service: General;  Laterality: N/A;  ? LEFT HEART CATH AND CORONARY ANGIOGRAPHY N/A 05/26/2018  ? Procedure: LEFT HEART CATH AND CORONARY ANGIOGRAPHY;  Surgeon: Troy Sine, MD;  Location: [facility];  Service: Cardiovascular;  Laterality: N/A;  ? SHOULDER SURGERY    ? ? ?Current Outpatient Medications  ?Medication Sig Dispense Refill  ? ALPRAZolam (XANAX) 0.5 MG tablet Take 0.5 mg by mouth daily with lunch.    ? amiodarone (PACERONE) 200 MG tablet Take 2 tablets (400 mg total) TWICE daily for 2 weeks, then reduce and take 1 tablet (200 mg total) ONCE daily 90 tablet 3  ? apixaban (ELIQUIS) 5 MG TABS tablet Take 1 tablet (5 mg total) by mouth 2 (two) times daily. 60 tablet 3  ? cetirizine (ZYRTEC) 10 MG tablet Take 10 mg by mouth daily.    ?  lisinopril (ZESTRIL) 10 MG tablet Take 1 tablet (10 mg total) by mouth daily. 90 tablet 3  ? metoprolol succinate (TOPROL-XL) 50 MG 24 hr tablet Take 0.5 tablets (25 mg total) by mouth 2 (two) times daily. 60 tablet 0  ? metoprolol tartrate (LOPRESSOR) 25 MG tablet Take 1 tablet by mouth every 8 hours as needed for afib HR over 120 30 tablet 1  ? nitroGLYCERIN (NITROSTAT) 0.4 MG SL tablet Place 1 tablet (0.4 mg total)  under the tongue every 5 (five) minutes as needed for chest pain. 25 tablet 4  ? pantoprazole (PROTONIX) 40 MG tablet Take 40 mg by mouth daily.    ? rosuvastatin (CRESTOR) 20 MG tablet Take 1 tablet (20 mg total) by mouth daily. 90 tablet 3  ? sodium chloride (OCEAN) 0.65 % SOLN nasal spray Place 1 spray into both nostrils as needed for congestion.    ? ?No current facility-administered medications for this encounter.  ? ? ?Allergies  ?Allergen Reactions  ? Trolamine Salicylate Rash  ? ? ?Social History  ? ?Socioeconomic History  ? Marital status: Divorced  ?  Spouse name: Not on file  ? Number of children: Not on file  ? Years of education: Not on file  ? Highest education level: Not on file  ?Occupational History  ? Not on file  ?Tobacco Use  ? Smoking status: Never  ? Smokeless tobacco: Never  ? Tobacco comments:  ?  Never smoke 10/24/21  ?Vaping Use  ? Vaping Use: Never used  ?Substance and Sexual Activity  ? Alcohol use: Not Currently  ?  Alcohol/week: 6.0 - 8.0 standard drinks  ?  Types: 6 - 8 Cans of beer per week  ?  Comment: no drink since 06/23/21  ? Drug use: No  ? Sexual activity: Not on file  ?Other Topics Concern  ? Not on file  ?Social History Narrative  ? Not on file  ? ?Social Determinants of Health  ? ?Financial Resource Strain: Not on file  ?Food Insecurity: Not on file  ?Transportation Needs: Not on file  ?Physical Activity: Not on file  ?Stress: Not on file  ?Social Connections: Not on file  ?Intimate Partner Violence: Not on file  ? ? ? ?ROS- All systems are reviewed and negative except as per the HPI above. ? ?Physical Exam: ?Vitals:  ? 10/24/21 1142  ?BP: 118/86  ?Pulse: 76  ?Weight: 128.1 kg  ?Height: 6' (1.829 m)  ? ? ? ?GEN- The patient is a well appearing obese male, alert and oriented x 3 today.   ?HEENT-head normocephalic, atraumatic, sclera clear, conjunctiva pink, hearing intact, trachea midline. ?Lungs- Clear to ausculation bilaterally, normal work of breathing ?Heart- Regular rate  and rhythm, no murmurs, rubs or gallops  ?GI- soft, NT, ND, + BS ?Extremities- no clubbing, cyanosis, or edema ?MS- no significant deformity or atrophy ?Skin- no rash or lesion ?Psych- euthymic mood, full affect ?Neuro- strength and sensation are intact ? ? ?Wt Readings from Last 3 Encounters:  ?10/24/21 128.1 kg  ?09/26/21 124.7 kg  ?08/24/21 124.7 kg  ? ? ?EKG today demonstrates ?SR, 1st degree AV block ?Vent. rate 76 BPM ?PR interval 208 ms ?QRS duration 88 ms ?QT/QTcB 394/443 ms ? ?Echo 07/06/21 demonstrated  ? 1. Left ventricular ejection fraction, by estimation, is 60 to 65%. The  ?left ventricle has normal function. The left ventricle has no regional  ?wall motion abnormalities. Left ventricular diastolic parameters are  ?consistent with Grade II diastolic dysfunction (  pseudonormalization).  ? 2. Right ventricular systolic function is normal. The right ventricular  ?size is normal.  ? 3. The mitral valve is normal in structure. Trivial mitral valve  ?regurgitation. No evidence of mitral stenosis.  ? 4. The aortic valve is normal in structure. Aortic valve regurgitation is  ?not visualized. No aortic stenosis is present.  ? 5. The inferior vena cava is normal in size with greater than 50%  ?respiratory variability, suggesting right atrial pressure of 3 mmHg.  ? ?Epic records are reviewed at length today ? ?CHA2DS2-VASc Score = 1  ?The patient's score is based upon: ?CHF History: 0 ?HTN History: 1 ?Diabetes History: 0 ?Stroke History: 0 ?Vascular Disease History: 0 ?Age Score: 0 ?Gender Score: 0 ?    ? ? ?ASSESSMENT AND PLAN: ?1. Paroxysmal Atrial Fibrillation/atrial flutter ?The patient's CHA2DS2-VASc score is 1, indicating a 0.6% annual risk of stroke.   ?S/p afib and flutter ablation 09/26/21 ?Patient appears to be maintaining SR.  ?Continue amiodarone 200 mg daily for now.  ?Continue Toprol 25 mg BID with Lopressor 25 mg q 8 hours PRN for heart racing.  ?Continue Eliquis 5 mg BID with no missed doses for 3  months post ablation.  ? ?2. Obesity ?Body mass index is 38.3 kg/m?. ?Lifestyle modification was discussed and encouraged including regular physical activity and weight reduction. ? ?3. OSA ?Severe O

## 2021-11-02 ENCOUNTER — Other Ambulatory Visit: Payer: Self-pay | Admitting: Physician Assistant

## 2021-11-02 ENCOUNTER — Telehealth: Payer: Self-pay | Admitting: Physician Assistant

## 2021-11-02 MED ORDER — FUROSEMIDE 20 MG PO TABS
20.0000 mg | ORAL_TABLET | Freq: Every day | ORAL | 3 refills | Status: DC
Start: 2021-11-02 — End: 2021-12-25

## 2021-11-02 NOTE — Telephone Encounter (Signed)
Pt c/o swelling and pain in legs. ? ?Says they swell at work, but this is more than usual. ? ?BP this am was 117/70s, BP 130s in the evening. ? ?Pt has been waking up with swelling in the last few days, new.  ? ?Denies changes in DOE. ? ?Admits that he lost some wt when in Afib, was afraid to eat. Since the ablation, has been eating more, lots of fast food. Discussed need to limit high-sodium foods.  ? ?Will give him Lasix 20 mg tabs, take x 2 days and then prn. ? ?Make sure to eat some foods w/ K+ on days you take the Lasix. ? ?Renal function and K+ have been normal in the past, can check at upcoming appt.  ? ?He wonders if these sx are from the rosuvastatin. Do not think so, dietary issues and standing at work are more likely culprits. ? ?Call back if any concerns.  ? ?Keep f/u appt w/ Dr Tresa Endo. ? ?Theodore Demark, PA-C ?11/02/2021 ?7:17 PM ? ? ? ? ? ? ? ?

## 2021-12-01 ENCOUNTER — Other Ambulatory Visit: Payer: Self-pay | Admitting: Physician Assistant

## 2021-12-04 ENCOUNTER — Encounter: Payer: Self-pay | Admitting: Cardiovascular Disease

## 2021-12-04 ENCOUNTER — Ambulatory Visit (INDEPENDENT_AMBULATORY_CARE_PROVIDER_SITE_OTHER): Payer: Managed Care, Other (non HMO) | Admitting: Cardiovascular Disease

## 2021-12-04 DIAGNOSIS — E669 Obesity, unspecified: Secondary | ICD-10-CM | POA: Diagnosis not present

## 2021-12-04 DIAGNOSIS — I480 Paroxysmal atrial fibrillation: Secondary | ICD-10-CM

## 2021-12-04 DIAGNOSIS — Z79899 Other long term (current) drug therapy: Secondary | ICD-10-CM

## 2021-12-04 DIAGNOSIS — I483 Typical atrial flutter: Secondary | ICD-10-CM | POA: Diagnosis not present

## 2021-12-04 DIAGNOSIS — E7800 Pure hypercholesterolemia, unspecified: Secondary | ICD-10-CM

## 2021-12-04 DIAGNOSIS — G4733 Obstructive sleep apnea (adult) (pediatric): Secondary | ICD-10-CM

## 2021-12-04 NOTE — Patient Instructions (Signed)
Medication Instructions:  Your Physician recommend you continue on your current medication as directed.    *If you need a refill on your cardiac medications before your next appointment, please call your pharmacy*   Lab Work: Your physician recommends that you schedule a follow-up appointment in 2 months (CMP, Lipid)  If you have labs (blood work) drawn today and your tests are completely normal, you will receive your results only by: MyChart Message (if you have MyChart) OR A paper copy in the mail If you have any lab test that is abnormal or we need to change your treatment, we will call you to review the results.   Testing/Procedures: None ordered today   Follow-Up: At [facility], you and your health needs are our priority.  As part of our continuing mission to provide you with exceptional heart care, we have created designated Provider Care Teams.  These Care Teams include your primary Cardiologist (physician) and Advanced Practice Providers (APPs -  Physician Assistants and Nurse Practitioners) who all work together to provide you with the care you need, when you need it.  We recommend signing up for the patient portal called "MyChart".  Sign up information is provided on this After Visit Summary.  MyChart is used to connect with patients for Virtual Visits (Telemedicine).  Patients are able to view lab/test results, encounter notes, upcoming appointments, etc.  Non-urgent messages can be sent to your provider as well.   To learn more about what you can do with MyChart, go to [email or web address].    Your next appointment:   6 month(s)  The format for your next appointment:   In Person  Provider:   Nicki Guadalajara, MD {   Important Information About Sugar

## 2021-12-04 NOTE — Progress Notes (Signed)
Patient ID: Joshua Simpson, male   DOB: 06-22-1971, 51 y.o.   MRN: [id number]     Primary MD: Darrol Jump, NP Cardiology: Shelva Majestic MD EP: Dr. Curt Bears  60-month follow-up evaluation  HPI:  Joshua A Luallen has a history of hypertension and in 2011 experience atypical chest pain.  He was evaluated by Dr. Sallyanne Kuster initially in  2011.  His chest pain was felt to be atypical and not exertionally related and most likely musculoskeletal.  It was felt that he had anxiety driven, sinus tachycardia and significant cardiac deconditioning.  His last cardiac evaluation was 5 years ago by Dr. Ellyn Hack.  The patient has issues with anxiety.  Over the last 3 weeks.  He is experiencing increased episodes of shortness of breath.  He denies chest pain.  He does have hypertension.  He has been awakened from sleep, particularly around 5 AM with his heart pounding.  He admits to very poor sleep.  His sleep is nonrestorative.  He does note some daytime sleepiness.  He snores and at times has awakened gasping for breath.  He has taken Xanax in the past for anxiety.  He has GERD for which he has taken pantoprazole.  He had recently been out of work after surgery which her surgery for [employer].  Because of his increasing symptoms associated with increased blood pressure, he was referred for cardiology evaluation.  When I saw him initially he had significant hypertension and his blood pressure was 150/110.  He had been on low-dose atenolol and I increased this to 25 mg in the morning and 12.5 mg in the evening.  I also started him on lisinopril at 5 mg daily.  He underwent an echo Doppler study which was done on 09/21/2015.  This showed an ejection fraction of 60-65% without wall motion abnormalities.  There was grade 2 diastolic dysfunction.  His aortic root was upper normal at 37 mm.  There was trivial MR.  When I saw him in April 2017 his blood pressure had stabilized and typically runs in the 295 systolic range  and in the 18A in the diastolic range at home.  He denied chest pain.  He does have anxiety issues and has taking alprazolam intermittently.  He has history of GERD for which he has been on pantoprazole.  I was concerned with sleep apnea but due to his lack of insurance he wished to defer evaluation.  He developed intermittent episodes of chest pain with atypical features which led to an emergency room evaluation February 2018.  Troponins were negative.  He did not have acute ECG changes, but he did have nonspecific lateral T-wave abnormality.  Chest x-ray did not reveal any significant abnormality.    When I saw him in May 2018 he had undergone a routine treadmill test which was negative for ischemia. He had been doing fairly well until February 2019 and he developed palpitations as well as episodes of chest discomfort episodes of awakening at night with his heart pounding and not sleeping well.  In the past I had recommended a sleep evaluation but he did decided against this.  He was seen by Jory Sims, NP for a 14-day monitor.  This revealed predominant sinus rhythm with an average rate at 60 bpm.  There were periods of sinus bradycardia, isolated PACs, and rare episodes of atrial couplets.  There was transient first-degree AV block.  There was no ventricular ectopy or episodes of atrial fibrillation or significant pauses.  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  He had undergone cholecystectomy in January 2019.  Recently, he admits to shortness of breath with activity and admits to perspiring significantly.  He notes that sharp atypical chest pain which is nonexertional and short-lived.    He was seen by Almyra Deforest on May 15, 2018 and at that time was having worsening chest pain both at rest and with exertion over 2 months previously.  He was referred for definitive cardiac catheterization which I performed on May 26, 2018.  He was  found to have low normal LV function with EF estimated 50%.  LVEDP was increased to 22 mm.  He did not have focal segmental wall motion abnormalities.  He had normal epicardial coronary arteries.  There is a she did not identify any coronary obstructive disease.  If he continues to experience exertional chest pressure cardiopulmonary met testing to assess for possible microvascular angina was recommended.  Also an outpatient echo Doppler study was recommended to reassess LV systolic and diastolic function.  I saw him on June 17, 2018 at which time he felt well and denied any recurrent chest pain.  However, the previous Sunday he had [place] and that evening noticed his heart racing and was unable to sleep.  Retrospectively, he admits that he has experienced episodes of nocturnal palpitations.  He admits to snoring.  His sleep is nonrestorative.  He admits to daytime fatigue.  At times he also has noticed some mild ankle swelling particularly since with his new job he must wear boots to work.  During that evaluation, I recommended that he discontinue atenolol and changed to metoprolol succinate 50 mg daily.  He was on lisinopril 5 mg and had recently been started on rosuvastatin for LDL cholesterol of 143 and when he was seen by Almyra Deforest this was changed to pravastatin.  In the past I had recommended that he undergo a sleep study for high level of suspicion for obstructive sleep apnea which may be contributing to some of his nocturnal palpitations.  He was evaluated by Malka So following development of atrial fibrillation in November 2022.  Echo Doppler study on July 06, 2021 showed normal LV function with EF 60 to 65% with grade 2 diastolic dysfunction.  He was recently evaluated by him on July 21, 2021 after he presented to the emergency room with presyncope.  He wore a 2-week Zio patch monitor between January 6 and August 04, 2021 which showed predominant sinus rhythm with frequent  episodes of SVT with the fastest interval lasting 8 beats with a rate of 231 in the lab longest lasting 16.8 seconds at an average rate of 135.  There also were episodes of atrial fibrillation/flutter with a 21% burden with rates averaging from 51 to 2018 bpm.  He underwent a home sleep study on July 18, 2021 which revealed severe obstructive sleep apnea with an AHI of 38.7/h.  There was a significant positional component with supine sleep AHI 55.3 versus nonsupine sleep AHI at 17.2.  He had severe oxygen desaturation to a nadir of 80%.  He is still awaiting approval from insurance for his titration and if unable the recommendation was to initiate AutoPap initially at 6 to 18 cm of water.  He evaluated by Dr. Curt Bears on August 10, 2021.  ECG that day showed sinus rhythm.  However, with his 21% atrial fibrillation burden and on his monitor, it was advised  that he initiate Eliquis and start amiodarone at a lengthy discussion concerning future treatment resulted in scheduling him for atrial fibrillation ablation tentatively scheduled for September 26, 2021.  I last saw him on August 23, 2021.  He states his episodes of atrial fibrillation in the past typically could occur for 3 hours and then resolved.  I reviewed his sleep study which showed severe sleep apnea and I have recommended that if he cannot have in lab titration that we initiate AutoPap therapy.  I reviewed his most recent echo Doppler study which showed normal systolic function with grade 2 diastolic dysfunction.  I also reviewed laboratory from June 07, 2021 which showed total cholesterol 282 triglycerides 225 and LDL cholesterol 191.  Presently, he has been on amiodarone 200 mg daily (but previously was on twice daily for 2 weeks, Eliquis 5 mg twice a day, lisinopril 10 mg, and metoprolol succinate 25 mg twice a day.    Since I saw him, he underwent successful ablation Dr. Curt Bears on September 26, 2020 for his paroxysmal atrial fibrillation and  typical appearing atrial flutter.  He is not aware of any recurrent atrial fibrillation since and feels significantly improved.  He received a CPAP AirSense 10 unit on August 30, 2021.  He notes marked improvement in his sleep.  Initial download from February 15 through March 16 showed 100% compliance with average use at 7 hours and 5 minutes.  Pressure range was set at 6 to 18 cm and AHI was 1.3.  More recent download from April 19 through Nov 30, 2021 continues to show excellent compliance with AHI 0.7.  His 95th percentile pressure is 10.9 with maximum average pressure 12.5 he denies any chest pain.  He was started on rosuvastatin 20 mg for LDL cholesterol 164 from lab work on September 15, 2021.  Total cholesterol was 230.  He continues to be on Eliquis for anticoagulation.  He has been taking furosemide 20 mg, lisinopril 10 mg, metoprolol succinate 25 mg twice a day for blood pressure.  He is tolerating rosuvastatin.  He continues to be on amiodarone 20 mg daily.  He will be seeing Dr. Curt Bears for follow-up in several weeks.  He presents for follow-up evaluation with me.   Past Medical History:  Diagnosis Date   Anxiety    Atrial fibrillation ([place])    Hypertension     Past Surgical History:  Procedure Laterality Date   ATRIAL FIBRILLATION ABLATION N/A 09/26/2021   Procedure: ATRIAL FIBRILLATION ABLATION;  Surgeon: Constance Haw, MD;  Location: [place] [facility];  Service: Cardiovascular;  Laterality: N/A;   CHOLECYSTECTOMY N/A 08/14/2017   Procedure: LAPAROSCOPIC CHOLECYSTECTOMY WITH INTRAOPERATIVE CHOLANGIOGRAM;  Surgeon: Donnie Mesa, MD;  Location: [facility];  Service: General;  Laterality: N/A;   LEFT HEART CATH AND CORONARY ANGIOGRAPHY N/A 05/26/2018   Procedure: LEFT HEART CATH AND CORONARY ANGIOGRAPHY;  Surgeon: Troy Sine, MD;  Location: [place] [facility];  Service: Cardiovascular;  Laterality: N/A;   SHOULDER SURGERY      Allergies  Allergen Reactions   Trolamine  Salicylate Rash    Current Outpatient Medications  Medication Sig Dispense Refill   ALPRAZolam (XANAX) 0.5 MG tablet Take 0.5 mg by mouth daily with lunch.     amiodarone (PACERONE) 200 MG tablet Take 2 tablets (400 mg total) TWICE daily for 2 weeks, then reduce and take 1 tablet (200 mg total) ONCE daily 90 tablet 3   apixaban (ELIQUIS) 5 MG TABS tablet Take 1  tablet (5 mg total) by mouth 2 (two) times daily. 60 tablet 3   cetirizine (ZYRTEC) 10 MG tablet Take 10 mg by mouth daily.     metoprolol succinate (TOPROL-XL) 50 MG 24 hr tablet Take 0.5 tablets (25 mg total) by mouth 2 (two) times daily. 60 tablet 1   pantoprazole (PROTONIX) 40 MG tablet Take 40 mg by mouth daily.     sodium chloride (OCEAN) 0.65 % SOLN nasal spray Place 1 spray into both nostrils as needed for congestion.     furosemide (LASIX) 20 MG tablet Take 1 tablet (20 mg total) by mouth daily. Take one tab daily x 2 days and then 1 tab prn for wt gain 5 lbs in a week or 3 lbs in a day (Patient not taking: Reported on 12/04/2021) 30 tablet 3   lisinopril (ZESTRIL) 10 MG tablet Take 1 tablet (10 mg total) by mouth daily. 90 tablet 3   metoprolol tartrate (LOPRESSOR) 25 MG tablet Take 1 tablet by mouth every 8 hours as needed for afib HR over 120 (Patient not taking: Reported on 12/04/2021) 30 tablet 1   nitroGLYCERIN (NITROSTAT) 0.4 MG SL tablet Place 1 tablet (0.4 mg total) under the tongue every 5 (five) minutes as needed for chest pain. 25 tablet 4   rosuvastatin (CRESTOR) 20 MG tablet Take 1 tablet (20 mg total) by mouth daily. 90 tablet 3   No current facility-administered medications for this visit.    Social History   Socioeconomic History   Marital status: Divorced    Spouse name: Not on file   Number of children: Not on file   Years of education: Not on file   Highest education level: Not on file  Occupational History   Not on file  Tobacco Use   Smoking status: Never   Smokeless tobacco: Never   Tobacco  comments:    Never smoke 10/24/21  Vaping Use   Vaping Use: Never used  Substance and Sexual Activity   Alcohol use: Not Currently    Alcohol/week: 6.0 - 8.0 standard drinks    Types: 6 - 8 Cans of beer per week    Comment: no drink since 06/23/21   Drug use: No   Sexual activity: Not on file  Other Topics Concern   Not on file  Social History Narrative   Not on file   Social Determinants of Health   Financial Resource Strain: Not on file  Food Insecurity: Not on file  Transportation Needs: Not on file  Physical Activity: Not on file  Stress: Not on file  Social Connections: Not on file  Intimate Partner Violence: Not on file   Social history is notable that he is married for > 18 years.  He has one child.  He works at the [employer].  He completed 11th grade of education.  There is no history of tobacco use.  He does not routinely exercise but does walk occasionally.  Family History  Problem Relation Age of Onset   Lung cancer Father    Family history is notable that both parents are living at age 34.  Mother has issues with anxiety.  Father had cancer of his kidney.  He is one brother age 63 and 1 sister, age 43.  His child is age 27.  ROS General: Negative; No fevers, chills, or night sweats HEENT: Negative; No changes in vision or hearing, sinus congestion, difficulty swallowing Pulmonary: Negative; No cough, wheezing, shortness of breath, hemoptysis Cardiovascular:  See HPI;  GI: Negative; No nausea, vomiting, diarrhea, or abdominal pain GU: Negative; No dysuria, hematuria, or difficulty voiding Musculoskeletal:Status post right rotator cuff surgery. Hematologic/Oncologic: Negative; no easy bruising, bleeding Endocrine: Negative; no heat/cold intolerance; no diabetes Neuro: Negative; no changes in balance, headaches Skin: Negative; No rashes or skin lesions Psychiatric: Positive for anxiety Sleep: SA, severe, recently started on CPAP therapy was set up  today August 30, 2021 [facility] as his DME company. Other comprehensive 14 point system review is negative   Physical Exam BP 124/84   Pulse 70   Ht 6' (1.829 m)   Wt 288 lb 9.6 oz (130.9 kg)   SpO2 94%   BMI 39.14 kg/m    Repeat blood pressure by me was 122/80  Wt Readings from Last 3 Encounters:  12/04/21 288 lb 9.6 oz (130.9 kg)  10/24/21 282 lb 6.4 oz (128.1 kg)  09/26/21 275 lb (124.7 kg)   General: Alert, oriented, no distress.  Mesomorphic body habitus Skin: normal turgor, no rashes, warm and dry HEENT: Normocephalic, atraumatic. Pupils equal round and reactive to light; sclera anicteric; extraocular muscles intact; Fundi ** Nose without nasal septal hypertrophy Mouth/Parynx benign; Mallinpatti scale 3/4 Neck: No JVD, no carotid bruits; normal carotid upstroke Lungs: clear to ausculatation and percussion; no wheezing or rales Chest wall: without tenderness to palpitation Heart: PMI not displaced, RRR, s1 s2 normal, 1/6 systolic murmur, no diastolic murmur, no rubs, gallops, thrills, or heaves Abdomen: soft, nontender; no hepatosplenomehaly, BS+; abdominal aorta nontender and not dilated by palpation. Back: no CVA tenderness Pulses 2+ Musculoskeletal: full range of motion, normal strength, no joint deformities Extremities: no clubbing cyanosis or edema, Homan's sign negative  Neurologic: grossly nonfocal; Cranial nerves grossly wnl Psychologic: Normal mood and affect   August 23, 2021 ECG (independently read by me): Normal sinus rhythm at 68 bpm, nonspecific T changes.  December 2019 ECG (independently read by me): Normal sinus rhythm at 75 bpm, occasional QTc interval 464 ms.  PR interval 196 ms.  [facility]  June 2019 ECG (independently read by me): Sinus rhythm at 67 bpm.  Normal intervals.  No ectopy.  No ST segment changes.  May 2018 ECG (independently read by me): Normal sinus rhythm at 66 bpm.  QTc interval 425 ms, PR interval 192 ms.  No significant  ST-T changes.  Mild nondiagnostic T change in lead 3.  April 2017 ECG (independently read by me): Normal sinus rhythm at 63 bpm.  Normal intervals.  January 2017 ECG (independently read by me): Normal sinus rhythm at 67 bpm.  QTc interval 452 ms.  No significant ST-T changes  LABS:     Latest Ref Rng & Units 09/15/2021   10:27 AM 08/24/2021   12:52 PM 07/20/2021   12:30 PM  BMP  Glucose 70 - 99 mg/dL 95   117   119    BUN 6 - 24 mg/dL $Remove'13   9   12    'ngYZPxt$ Creatinine 0.76 - 1.27 mg/dL 1.16   1.11   0.92    BUN/Creat Ratio 9 - 20 11      Sodium 134 - 144 mmol/L 140   141   138    Potassium 3.5 - 5.2 mmol/L 4.5   4.0   3.9    Chloride 96 - 106 mmol/L 103   107   103    CO2 20 - 29 mmol/L $RemoveB'21   22   25    'nNJtSQDN$ Calcium 8.7 - 10.2  mg/dL 9.6   9.9   9.5         Latest Ref Rng & Units 09/15/2021   10:27 AM 07/20/2021   12:30 PM 06/15/2021    4:12 PM  Hepatic Function  Total Protein 6.0 - 8.5 g/dL 6.8   7.8   7.0    Albumin 4.0 - 5.0 g/dL 4.7   4.9   4.3    AST 0 - 40 IU/L 28   36   47    ALT 0 - 44 IU/L 46   53   65    Alk Phosphatase 44 - 121 IU/L 83   75   62    Total Bilirubin 0.0 - 1.2 mg/dL 1.3   2.3   1.7    Bilirubin, Direct 0.00 - 0.40 mg/dL 0.26           Latest Ref Rng & Units 09/15/2021   10:27 AM 08/24/2021   12:52 PM 07/20/2021   12:30 PM  CBC  WBC 3.4 - 10.8 x10E3/uL 5.4   6.1   8.0    Hemoglobin 13.0 - 17.7 g/dL 16.6   17.5   17.1    Hematocrit 37.5 - 51.0 % 47.3   50.6   48.0    Platelets 150 - 450 x10E3/uL 195   232   233     Lab Results  Component Value Date   MCV 91 09/15/2021   MCV 90.4 08/24/2021   MCV 90.6 07/20/2021   Lab Results  Component Value Date   TSH 4.060 09/15/2021   No results found for: HGBA1C   BNP No results found for: BNP  ProBNP No results found for: PROBNP   Lipid Panel     Component Value Date/Time   CHOL 230 (H) 09/15/2021 1027   TRIG 118 09/15/2021 1027   HDL 45 09/15/2021 1027   CHOLHDL 5.1 (H) 09/15/2021 1027   LDLCALC 164 (H)  09/15/2021 1027    RADIOLOGY: No results found.   ECHO: 07/06/2021 IMPRESSIONS   1. Left ventricular ejection fraction, by estimation, is 60 to 65%. The  left ventricle has normal function. The left ventricle has no regional  wall motion abnormalities. Left ventricular diastolic parameters are  consistent with Grade II diastolic  dysfunction (pseudonormalization).   2. Right ventricular systolic function is normal. The right ventricular  size is normal.   3. The mitral valve is normal in structure. Trivial mitral valve  regurgitation. No evidence of mitral stenosis.   4. The aortic valve is normal in structure. Aortic valve regurgitation is  not visualized. No aortic stenosis is present.   5. The inferior vena cava is normal in size with greater than 50%  respiratory variability, suggesting right atrial pressure of 3 mmHg.    07/18/2021 CLINICAL INFORMATION Sleep Study Type: HST   Indication for sleep study: Excessive Daytime Sleepiness, Snoring, Atrial fibrillation   Epworth Sleepiness Score: 5   SLEEP STUDY TECHNIQUE A multi-channel overnight portable sleep study was performed. The channels recorded were: nasal airflow, thoracic respiratory movement, and oxygen saturation with a pulse oximetry. Snoring was also monitored.   MEDICATIONS ALPRAZolam (XANAX) 0.5 MG tablet amiodarone (PACERONE) 200 MG tablet amoxicillin-clavulanate (AUGMENTIN) 875-125 MG tablet apixaban (ELIQUIS) 5 MG TABS tablet atorvastatin (LIPITOR) 10 MG tablet cetirizine (ZYRTEC) 10 MG tablet fluticasone (FLONASE) 50 MCG/ACT nasal spray lisinopril (ZESTRIL) 10 MG tablet metoprolol succinate (TOPROL-XL) 50 MG 24 hr tablet metoprolol tartrate (LOPRESSOR) 25 MG tablet nitroGLYCERIN (NITROSTAT) 0.4 MG SL  tablet Patient self administered medications include: N/A.   SLEEP ARCHITECTURE Patient was studied for 365.5 minutes. The sleep efficiency was 100.0 % and the patient was supine for 56.7%. The arousal  index was 0.0 per hour.   RESPIRATORY PARAMETERS The overall AHI was 38.7 per hour, with a central apnea index of 0 per hour.   The oxygen nadir was 80% during sleep.   CARDIAC DATA Mean heart rate during sleep was 60.6 bpm.   IMPRESSIONS - Severe obstructive sleep apnea occurred during this study (AHI 38.7/h). There is a significant positional component with supine sleep AHI 55.3/h versus nonsupine sleep AHI 17.2/h. - Severe oxygen desaturation to a nadir of 80%. - Patient snored 19.4% during the sleep.   DIAGNOSIS - Obstructive Sleep Apnea (G47.33) - Nocturnal Hypoxemia (G47.36)   RECOMMENDATIONS - In this patient with severe sleep apnea and cardiovascular comorbidities incliuding atrial fibrillation recommend an in-lab CPAP titration study. If unable for in-lab evaluation, initiate Auto-PAP at 6 - 18 cm of water. - Effort should be made to optimize nasal and oropharyngeal patency. - Positional therapy avoiding supine position during sleep. - Avoid alcohol, sedatives and other CNS depressants that may worsen sleep apnea and disrupt normal sleep architecture. - Sleep hygiene should be reviewed to assess factors that may improve sleep quality. - Weight management (BMI 37) and regular exercise should be initiated or continued. - Recommend a download and sleep clinic evaluation after one month of therapy.  IMPRESSION:  1. Paroxysmal atrial fibrillation [facility]): s/p ablation   2. Typical atrial flutter [facility]): s/p ablation   3. OSA (obstructive sleep apnea) on CPAP: set-up 08/30/2021   4. Obesity, Class II, BMI 35-39.9   5. Pure hypercholesterolemia   6. Medication management     ASSESSMENT AND PLAN: Mr. Joshua Kington is a 51 year-old gentleman who has a history of obesity, as well as hypertension and anxiety.  Remotely, he had noticed episodes of increasing shortness of breath and palpitations as well as nocturnal palpitations with snoring while sleeping.  A routine treadmill test was  negative for ischemia but had demonstrated  systolic hypertension with stress.  A remote echo Doppler study in 2017 had shown normal systolic function with grade 2 diastolic dysfunction.  Remotely, I discussed sleep evaluation but he never pursued this in the past before he was working due to financial concerns.  He had developed increasing chest pain symptomatology led to ultimate cardiac catheterization in November 2019 and he was found o have normal epicardial coronary arteries.  LVEDP was increased to 22 mm.  LV function was low normal at 50%.  Over the last several years, he had experienced palpitations intermittently.  Ultimately he developed atrial fibrillation in November 2022.  Subsequent echo Doppler study has continued to show preserved global LV contractility with EF at 60 to 65% and grade 2 diastolic dysfunction.  Cardiac monitoring has revealed bursts of short-lived SVT as well as 21% atrial fibrillation/flutter burden.  He ultimately underwent his sleep study on July 18, 2021 which confirmed my suspicion for severe obstructive sleep apnea.  He was started on AutoPap therapy pressure range of 6 to 18 cm set up date on August 30, 2021.  Compliance has been excellent with usage however it only 6 hours and 37 minutes most recent download.  AHI 0.7.  His pressure settings were at a range of 6 to 18 cm of water and I have recommended changing his settings to 8-18 since his 95th percentile pressure is around  11 to 12 cm.  He underwent successful atrial fibrillation and atrial flutter ablation by Dr. Curt Bears.  He continues to be on low-dose amiodarone with plans for follow-up evaluation with Dr. Curt Bears in several weeks.  He continues to be on Eliquis anticoagulation.  His blood pressure today is stable.  He was wondering if he needed to stay on the rosuvastatin.  He was recently started on this initially at 20 mg with LDL cholesterol significantly elevated on March 3 at 164.  He is tolerating this well.   I have recommended follow-up laboratory in 2 to 3 months for reassessment and dose adjustment if still elevated.  His blood pressure today is stable on lisinopril 10 mg, furosemide 20 mg, and metoprolol succinate 25 mg twice a day.  I will see him in 6 months for reevaluation or sooner as needed.   Troy Sine, MD, [facility] 12/04/2021 6:12 PM

## 2021-12-25 ENCOUNTER — Encounter: Payer: Self-pay | Admitting: Cardiology

## 2021-12-25 ENCOUNTER — Encounter: Payer: Self-pay | Admitting: *Deleted

## 2021-12-25 ENCOUNTER — Ambulatory Visit (INDEPENDENT_AMBULATORY_CARE_PROVIDER_SITE_OTHER): Payer: Managed Care, Other (non HMO) | Admitting: Cardiology

## 2021-12-25 VITALS — BP 112/78 | HR 65 | Ht 72.0 in | Wt 289.6 lb

## 2021-12-25 DIAGNOSIS — I480 Paroxysmal atrial fibrillation: Secondary | ICD-10-CM | POA: Diagnosis not present

## 2021-12-25 NOTE — Patient Instructions (Signed)
Medication Instructions:  Your physician has recommended you make the following change in your medication:  STOP Eliquis STOP Amiodarone  *If you need a refill on your cardiac medications before your next appointment, please call your pharmacy*   Lab Work: None ordered   Testing/Procedures: None ordered   Follow-Up: At [facility], you and your health needs are our priority.  As part of our continuing mission to provide you with exceptional heart care, we have created designated Provider Care Teams.  These Care Teams include your primary Cardiologist (physician) and Advanced Practice Providers (APPs -  Physician Assistants and Nurse Practitioners) who all work together to provide you with the care you need, when you need it.   Your next appointment:   6 month(s)  The format for your next appointment:   In Person  Provider:   You will follow up in the Atrial Fibrillation Clinic located at [facility]. Your provider will be: Rudi Coco, NP or Clint R. Fenton, PA-C    Thank you for choosing [facility]!!   Dory Horn, RN [phone]  Other Instructions   Important Information About Sugar

## 2021-12-25 NOTE — Progress Notes (Signed)
Electrophysiology Office Note   Date:  12/25/2021   ID:  Joshua Simpson, DOB [birth year], MRN [id number]  PCP:  Imagene Riches, NP  Cardiologist:  Claiborne Billings Primary Electrophysiologist:  Teagen Mcleary Meredith Leeds, MD    Chief Complaint: AF   History of Present Illness: Joshua Simpson is a 51 y.o. male who is being seen today for the evaluation of AF at the request of Darrol Jump, NP. Presenting today for electrophysiology evaluation.  He has a history significant hypertension, atrial fibrillation, obstructive sleep apnea.  He was initially diagnosed with atrial fibrillation on 06/07/2021 after presenting to his PCP.  He was in rapid atrial fibrillation.  He wore a monitor with a 21% atrial fibrillation burden.  He is now status post atrial fibrillation ablation on 09/26/2021.  Today, denies symptoms of palpitations, chest pain, shortness of breath, orthopnea, PND, lower extremity edema, claudication, dizziness, presyncope, syncope, bleeding, or neurologic sequela. The patient is tolerating medications without difficulties.  Since being seen he has done well.  He has had no further episodes of atrial fibrillation since his ablation.  He is overall quite happy with his control.   Past Medical History:  Diagnosis Date   Anxiety    Atrial fibrillation ([place])    Hypertension    Past Surgical History:  Procedure Laterality Date   ATRIAL FIBRILLATION ABLATION N/A 09/26/2021   Procedure: ATRIAL FIBRILLATION ABLATION;  Surgeon: Constance Haw, MD;  Location: [facility];  Service: Cardiovascular;  Laterality: N/A;   CHOLECYSTECTOMY N/A 08/14/2017   Procedure: LAPAROSCOPIC CHOLECYSTECTOMY WITH INTRAOPERATIVE CHOLANGIOGRAM;  Surgeon: Donnie Mesa, MD;  Location: [facility];  Service: General;  Laterality: N/A;   LEFT HEART CATH AND CORONARY ANGIOGRAPHY N/A 05/26/2018   Procedure: LEFT HEART CATH AND CORONARY ANGIOGRAPHY;  Surgeon: Troy Sine, MD;  Location: [facility];  Service:  Cardiovascular;  Laterality: N/A;   SHOULDER SURGERY       Current Outpatient Medications  Medication Sig Dispense Refill   ALPRAZolam (XANAX) 0.5 MG tablet Take 0.5 mg by mouth daily with lunch.     amiodarone (PACERONE) 200 MG tablet Take 2 tablets (400 mg total) TWICE daily for 2 weeks, then reduce and take 1 tablet (200 mg total) ONCE daily 90 tablet 3   apixaban (ELIQUIS) 5 MG TABS tablet Take 1 tablet (5 mg total) by mouth 2 (two) times daily. 60 tablet 3   cetirizine (ZYRTEC) 10 MG tablet Take 10 mg by mouth daily.     lisinopril (ZESTRIL) 10 MG tablet Take 1 tablet (10 mg total) by mouth daily. 90 tablet 3   metoprolol succinate (TOPROL-XL) 50 MG 24 hr tablet Take 0.5 tablets (25 mg total) by mouth 2 (two) times daily. 60 tablet 1   metoprolol tartrate (LOPRESSOR) 25 MG tablet Take 1 tablet by mouth every 8 hours as needed for afib HR over 120 30 tablet 1   nitroGLYCERIN (NITROSTAT) 0.4 MG SL tablet Place 1 tablet (0.4 mg total) under the tongue every 5 (five) minutes as needed for chest pain. 25 tablet 4   pantoprazole (PROTONIX) 40 MG tablet Take 40 mg by mouth daily.     rosuvastatin (CRESTOR) 20 MG tablet Take 1 tablet (20 mg total) by mouth daily. 90 tablet 3   No current facility-administered medications for this visit.    Allergies:   Trolamine salicylate   Social History:  The patient  reports that he has never smoked. He has never used smokeless tobacco.  He reports that he does not currently use alcohol after a past usage of about 6.0 - 8.0 standard drinks of alcohol per week. He reports that he does not use drugs.   Family History:  The patient's family history includes Lung cancer in his father.   ROS:  Please see the history of present illness.   Otherwise, review of systems is positive for none.   All other systems are reviewed and negative.   PHYSICAL EXAM: VS:  BP 112/78 (BP Location: Left Arm, Patient Position: Sitting, Cuff Size: Large)   Pulse 65   Ht 6'  (1.829 m)   Wt 289 lb 9.6 oz (131.4 kg)   BMI 39.28 kg/m  , BMI Body mass index is 39.28 kg/m. GEN: Well nourished, well developed, in no acute distress  HEENT: normal  Neck: no JVD, carotid bruits, or masses Cardiac: RRR; no murmurs, rubs, or gallops,no edema  Respiratory:  clear to auscultation bilaterally, normal work of breathing GI: soft, nontender, nondistended, + BS MS: no deformity or atrophy  Skin: warm and dry Neuro:  Strength and sensation are intact Psych: euthymic mood, full affect  EKG:  EKG is ordered today. Personal review of the ekg ordered shows sinus rhythm, rate 65  Recent Labs: 07/08/2021: Magnesium 2.1 09/15/2021: ALT 46; BUN 13; Creatinine, Ser 1.16; Hemoglobin 16.6; Platelets 195; Potassium 4.5; Sodium 140; TSH 4.060    Lipid Panel     Component Value Date/Time   CHOL 230 (H) 09/15/2021 1027   TRIG 118 09/15/2021 1027   HDL 45 09/15/2021 1027   CHOLHDL 5.1 (H) 09/15/2021 1027   LDLCALC 164 (H) 09/15/2021 1027     Wt Readings from Last 3 Encounters:  12/25/21 289 lb 9.6 oz (131.4 kg)  12/04/21 288 lb 9.6 oz (130.9 kg)  10/24/21 282 lb 6.4 oz (128.1 kg)      Other studies Reviewed: Additional studies/ records that were reviewed today include: TTE 07/06/21  Review of the above records today demonstrates:   1. Left ventricular ejection fraction, by estimation, is 60 to 65%. The  left ventricle has normal function. The left ventricle has no regional  wall motion abnormalities. Left ventricular diastolic parameters are  consistent with Grade II diastolic  dysfunction (pseudonormalization).   2. Right ventricular systolic function is normal. The right ventricular  size is normal.   3. The mitral valve is normal in structure. Trivial mitral valve  regurgitation. No evidence of mitral stenosis.   4. The aortic valve is normal in structure. Aortic valve regurgitation is  not visualized. No aortic stenosis is present.   5. The inferior vena cava is  normal in size with greater than 50%  respiratory variability, suggesting right atrial pressure of 3 mmHg.   Cardiac monitor 08/08/2021 personally reviewed Predominant underlying rhythm was sinus rhythm Multiple SVT episodes, all less than 18 seconds 21% atrial fibrillation/flutter burden Less than 1% ventricular and supraventricular ectopy Triggered events associated with atrial fibrillation/flutter and PACs   ASSESSMENT AND PLAN:  1.  Paroxysmal atrial fibrillation: CHA2DS2-VASc of 1.  Currently on Eliquis 5 mg twice daily, amiodarone 200 mg daily.  Status post ablation 09/26/2021.  He is remained in sinus rhythm.  We Seraphim Trow stop amiodarone and Eliquis today.  2.  Obesity: Weight loss encouraged Body mass index is 39.28 kg/m.  3.  Obstructive sleep apnea: CPAP compliance encouraged  4.  Hypertension: Currently well controlled   Current medicines are reviewed at length with the patient today.  The patient does not have concerns regarding his medicines.  The following changes were made today: Stop amiodarone and Eliquis  Labs/ tests ordered today include:  Orders Placed This Encounter  Procedures   EKG 12-Lead     Disposition:   FU 6 months  Signed, Tyrome Donatelli Meredith Leeds, MD  12/25/2021 1:55 PM     [place] [place] [address] [phone] (office) [phone] (fax)

## 2021-12-28 ENCOUNTER — Telehealth: Payer: Self-pay | Admitting: Cardiovascular Disease

## 2021-12-28 NOTE — Telephone Encounter (Signed)
Discussed with patient reassured coming off amiodarone will notice slight increase in heart rate over time. Continue to watch BP/HR. Pt was mainly worried stopping amiodarone and noticed a difference in his HR/BP. Pt appreciative of advice and will call if issues arise.

## 2021-12-28 NOTE — Telephone Encounter (Signed)
Pt c/o BP issue: STAT if pt c/o blurred vision, one-sided weakness or slurred speech  1. What are your last 5 BP readings?   6/15 - 117/80 6/14 - 130/90  2. Are you having any other symptoms (ex. Dizziness, headache, blurred vision, passed out)? No  3. What is your BP issue? Patient stated he just came off the heart rate medicine on Monday and is concerned because his BP and HR don't seem normal to him.   Heart Rate Usually around 58-60.  Currently 60  STAT if HR is under 50 or over 120 (normal HR is 60-100 beats per minute)  What is your heart rate? 60  Do you have a log of your heart rate readings (document readings)? No  Do you have any other symptoms? No

## 2022-04-28 LAB — COMPREHENSIVE METABOLIC PANEL
ALT: 33 IU/L (ref 0–44)
AST: 29 IU/L (ref 0–40)
Albumin/Globulin Ratio: 2.2 (ref 1.2–2.2)
Albumin: 5.3 g/dL — ABNORMAL HIGH (ref 4.1–5.1)
Alkaline Phosphatase: 90 IU/L (ref 44–121)
BUN/Creatinine Ratio: 11 (ref 9–20)
BUN: 12 mg/dL (ref 6–24)
Bilirubin Total: 1.9 mg/dL — ABNORMAL HIGH (ref 0.0–1.2)
CO2: 28 mmol/L (ref 20–29)
Calcium: 10.1 mg/dL (ref 8.7–10.2)
Chloride: 104 mmol/L (ref 96–106)
Creatinine, Ser: 1.08 mg/dL (ref 0.76–1.27)
Globulin, Total: 2.4 g/dL (ref 1.5–4.5)
Glucose: 75 mg/dL (ref 70–99)
Potassium: 4.6 mmol/L (ref 3.5–5.2)
Sodium: 146 mmol/L — ABNORMAL HIGH (ref 134–144)
Total Protein: 7.7 g/dL (ref 6.0–8.5)
eGFR: 84 mL/min/{1.73_m2} (ref >59)

## 2022-04-28 LAB — LIPID PANEL
Chol/HDL Ratio: 2.9 ratio (ref 0.0–5.0)
Cholesterol, Total: 160 mg/dL (ref 100–199)
HDL: 55 mg/dL (ref >39)
LDL Chol Calc (NIH): 83 mg/dL (ref 0–99)
Triglycerides: 122 mg/dL (ref 0–149)
VLDL Cholesterol Cal: 22 mg/dL (ref 5–40)

## 2022-04-30 ENCOUNTER — Encounter: Payer: Self-pay | Admitting: Cardiovascular Disease

## 2022-05-07 ENCOUNTER — Other Ambulatory Visit: Payer: Self-pay | Admitting: Physician Assistant

## 2022-05-30 ENCOUNTER — Encounter: Payer: Self-pay | Admitting: Cardiovascular Disease

## 2022-05-30 ENCOUNTER — Ambulatory Visit: Payer: BC Managed Care – PPO | Attending: Cardiovascular Disease | Admitting: Cardiovascular Disease

## 2022-05-30 DIAGNOSIS — G4733 Obstructive sleep apnea (adult) (pediatric): Secondary | ICD-10-CM | POA: Diagnosis not present

## 2022-05-30 DIAGNOSIS — K219 Gastro-esophageal reflux disease without esophagitis: Secondary | ICD-10-CM

## 2022-05-30 DIAGNOSIS — I480 Paroxysmal atrial fibrillation: Secondary | ICD-10-CM | POA: Diagnosis not present

## 2022-05-30 DIAGNOSIS — I10 Essential (primary) hypertension: Secondary | ICD-10-CM | POA: Diagnosis not present

## 2022-05-30 DIAGNOSIS — E782 Mixed hyperlipidemia: Secondary | ICD-10-CM

## 2022-05-30 MED ORDER — METOPROLOL SUCCINATE ER 50 MG PO TB24
ORAL_TABLET | ORAL | 3 refills | Status: DC
Start: 2022-05-30 — End: 2022-06-18

## 2022-05-30 MED ORDER — ROSUVASTATIN CALCIUM 40 MG PO TABS: 40.0000 mg | ORAL_TABLET | Freq: Every day | ORAL | 3 refills | Status: DC

## 2022-05-30 NOTE — Progress Notes (Signed)
Patient ID: Joshua Simpson, male   DOB: 07-25-1970, 51 y.o.   MRN: [id number]        Primary MD: Joshua Jump, Simpson Cardiology: Joshua Majestic MD EP: Dr. Curt Simpson  14-monthfollow-up evaluation  HPI:  Joshua Simpson a history of hypertension and in 2011 experience atypical chest pain.  He was evaluated by Dr. CSallyanne Kusterinitially in  2011.  His chest pain was felt to be atypical and not exertionally related and most likely musculoskeletal.  It was felt that he had anxiety driven, sinus tachycardia and significant cardiac deconditioning.  His last cardiac evaluation was 5 years ago by Dr. HEllyn Simpson  The patient has issues with anxiety.  Over the last 3 weeks.  He is experiencing increased episodes of shortness of breath.  He denies chest pain.  He does have hypertension.  He has been awakened from sleep, particularly around 5 AM with his heart pounding.  He admits to very poor sleep.  His sleep is nonrestorative.  He does note some daytime sleepiness.  He snores and at times has awakened gasping for breath.  He has taken Xanax in the past for anxiety.  He has GERD for which he has taken pantoprazole.  He had recently been out of work after surgery which her surgery for [employer]  Because of his increasing symptoms associated with increased blood pressure, he was referred for cardiology evaluation.  When I saw him initially he had significant hypertension and his blood pressure was 150/110.  He had been on low-dose atenolol and I increased this to 25 mg in the morning and 12.5 mg in the evening.  I also started him on lisinopril at 5 mg daily.  He underwent an echo Doppler study which was done on 09/21/2015.  This showed an ejection fraction of 60-65% without wall motion abnormalities.  There was grade 2 diastolic dysfunction.  His aortic root was upper normal at 37 mm.  There was trivial MR.  When I saw him in April 2017 his blood pressure had stabilized and typically runs in the 1706systolic  range and in the 823Jin the diastolic range at home.  He denied chest pain.  He does have anxiety issues and has taking alprazolam intermittently.  He has history of GERD for which he has been on pantoprazole.  I was concerned with sleep apnea but due to his lack of insurance he wished to defer evaluation.  He developed intermittent episodes of chest pain with atypical features which led to an emergency room evaluation February 2018.  Troponins were negative.  He did not have acute ECG changes, but he did have nonspecific lateral T-wave abnormality.  Chest x-ray did not reveal any significant abnormality.    When I saw him in May 2018 he had undergone a routine treadmill test which was negative for ischemia. He had been doing fairly well until February 2019 and he developed palpitations as well as episodes of chest discomfort episodes of awakening at night with his heart pounding and not sleeping well.  In the past I had recommended a sleep evaluation but he did decided against this.  He was seen by Joshua Simpson for a 14-day monitor.  This revealed predominant sinus rhythm with an average rate at 60 bpm.  There were periods of sinus bradycardia, isolated PACs, and rare episodes of atrial couplets.  There was transient first-degree AV block.  There was no ventricular ectopy or episodes of atrial fibrillation or significant pauses.  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  He had undergone cholecystectomy in January 2019.  Recently, he admits to shortness of breath with activity and admits to perspiring significantly.  He notes that sharp atypical chest pain which is nonexertional and short-lived.    He was seen by Joshua Simpson on May 15, 2018 and at that time was having worsening chest pain both at rest and with exertion over 2 months previously.  He was referred for definitive cardiac catheterization which I performed on May 26, 2018.  He  was found to have low normal LV function with EF estimated 50%.  LVEDP was increased to 22 mm.  He did not have focal segmental wall motion abnormalities.  He had normal epicardial coronary arteries.  There is a she did not identify any coronary obstructive disease.  If he continues to experience exertional chest pressure cardiopulmonary met testing to assess for possible microvascular angina was recommended.  Also an outpatient echo Doppler study was recommended to reassess LV systolic and diastolic function.  I saw him on June 17, 2018 at which time he felt well and denied any recurrent chest pain.  However, the previous Sunday he had [place] and that evening noticed his heart racing and was unable to sleep.  Retrospectively, he admits that he has experienced episodes of nocturnal palpitations.  He admits to snoring.  His sleep is nonrestorative.  He admits to daytime fatigue.  At times he also has noticed some mild ankle swelling particularly since with his new job he must wear boots to work.  During that evaluation, I recommended that he discontinue atenolol and changed to metoprolol succinate 50 mg daily.  He was on lisinopril 5 mg and had recently been started on rosuvastatin for LDL cholesterol of 143 and when he was seen by Joshua Simpson this was changed to pravastatin.  In the past I had recommended that he undergo a sleep study for high level of suspicion for obstructive sleep apnea which may be contributing to some of his nocturnal palpitations.  He was evaluated by Joshua Simpson following development of atrial fibrillation in November 2022.  Echo Doppler study on July 06, 2021 showed normal LV function with EF 60 to 65% with grade 2 diastolic dysfunction.  He was recently evaluated by him on July 21, 2021 after he presented to the emergency room with presyncope.  He wore a 2-week Zio patch monitor between January 6 and August 04, 2021 which showed predominant sinus rhythm with frequent  episodes of SVT with the fastest interval lasting 8 beats with a rate of 231 in the lab longest lasting 16.8 seconds at an average rate of 135.  There also were episodes of atrial fibrillation/flutter with a 21% burden with rates averaging from 51 to 2018 bpm.  He underwent a home sleep study on July 18, 2021 which revealed severe obstructive sleep apnea with an AHI of 38.7/h.  There was a significant positional component with supine sleep AHI 55.3 versus nonsupine sleep AHI at 17.2.  He had severe oxygen desaturation to a nadir of 80%.  He is still awaiting approval from insurance for his titration and if unable the recommendation was to initiate AutoPap initially at 6 to 18 cm of water.  He evaluated by Dr. Curt Simpson on August 10, 2021.  ECG that day showed sinus rhythm.  However, with his 21% atrial fibrillation burden and on his monitor, it  was advised that he initiate Eliquis and start amiodarone at a lengthy discussion concerning future treatment resulted in scheduling him for atrial fibrillation ablation tentatively scheduled for September 26, 2021.  I saw him on August 23, 2021.  He states his episodes of atrial fibrillation in the past typically could occur for 3 hours and then resolved.  I reviewed his sleep study which showed severe sleep apnea and I have recommended that if he cannot have in lab titration that we initiate AutoPap therapy.  I reviewed his most recent echo Doppler study which showed normal systolic function with grade 2 diastolic dysfunction.  I also reviewed laboratory from June 07, 2021 which showed total cholesterol 282 triglycerides 225 and LDL cholesterol 191.  Presently, he has been on amiodarone 200 mg daily (but previously was on twice daily for 2 weeks, Eliquis 5 mg twice a day, lisinopril 10 mg, and metoprolol succinate 25 mg twice a day.    I last saw him on Dec 04, 2021.  Prior to that evaluation he had undergone a successful ablation Dr. Curt Simpson on September 26, 2020 for  his paroxysmal atrial fibrillation and typical appearing atrial flutter.  He is not aware of any recurrent atrial fibrillation since and feels significantly improved.  He received a CPAP AirSense 10 unit on August 30, 2021.  He notes marked improvement in his sleep.  Initial download from February 15 through March 16 showed 100% compliance with average use at 7 hours and 5 minutes.  Pressure range was set at 6 to 18 cm and AHI was 1.3.  More recent download from April 19 through Nov 30, 2021 continues to show excellent compliance with AHI 0.7.  His 95th percentile pressure is 10.9 with maximum average pressure 12.5 he denies any chest pain.  He was started on rosuvastatin 20 mg for LDL cholesterol 164 from lab work on September 15, 2021.  Total cholesterol was 230.  He continues to be on Eliquis for anticoagulation.  He has been taking furosemide 20 mg, lisinopril 10 mg, metoprolol succinate 25 mg twice a day for blood pressure.  He is tolerating rosuvastatin.  He continues to be on amiodarone 200 mg daily.    He was evaluated on December 25, 2021 by Dr. Curt Simpson and remained stable without any episodes of recurrent A-fib since his ablation.  Presently, he continues to feel well and remains stable without symptoms of palpitations, chest pain shortness of breath PND orthopnea or lower extremity edema.  He continues to use CPAP with 100% compliance.  A download was obtained from October 15 through May 28, 2022 which shows 100% use with average use of 6 hours and 42 minutes.  His pressure is set at a range of 6 to 18 cm of water and AHI is excellent at 0.6 with 95th percentile pressure 11.1 and maximum average pressure 12.6.  He stays busy and participates in corn hole tournaments.  He needs clearance to undergo colonoscopy.  He continues to be on lisinopril 10 mg, metoprolol succinate 25 mg twice a day, rosuvastatin 20 mg, and just started on Ozempic 0.5 weekly injection.  He presents for evaluation.    Past  Medical History:  Diagnosis Date   Anxiety    Atrial fibrillation ([place])    Hypertension     Past Surgical History:  Procedure Laterality Date   ATRIAL FIBRILLATION ABLATION N/A 09/26/2021   Procedure: ATRIAL FIBRILLATION ABLATION;  Surgeon: Constance Haw, MD;  Location: [facility];  Service:  Cardiovascular;  Laterality: N/A;   CHOLECYSTECTOMY N/A 08/14/2017   Procedure: LAPAROSCOPIC CHOLECYSTECTOMY WITH INTRAOPERATIVE CHOLANGIOGRAM;  Surgeon: Donnie Mesa, MD;  Location: [facility];  Service: General;  Laterality: N/A;   LEFT HEART CATH AND CORONARY ANGIOGRAPHY N/A 05/26/2018   Procedure: LEFT HEART CATH AND CORONARY ANGIOGRAPHY;  Surgeon: Troy Sine, MD;  Location: [facility];  Service: Cardiovascular;  Laterality: N/A;   SHOULDER SURGERY      Allergies  Allergen Reactions   Trolamine Salicylate Rash    Current Outpatient Medications  Medication Sig Dispense Refill   ALPRAZolam (XANAX) 0.5 MG tablet Take 0.5 mg by mouth daily with lunch.     cetirizine (ZYRTEC) 10 MG tablet Take 10 mg by mouth daily.     lisinopril (ZESTRIL) 10 MG tablet Take 1 tablet (10 mg total) by mouth daily. 90 tablet 3   nitroGLYCERIN (NITROSTAT) 0.4 MG SL tablet Place 1 tablet (0.4 mg total) under the tongue every 5 (five) minutes as needed for chest pain. 25 tablet 4   pantoprazole (PROTONIX) 40 MG tablet Take 40 mg by mouth daily.     Semaglutide,0.25 or 0.5MG/DOS, (OZEMPIC, 0.25 OR 0.5 MG/DOSE,) 2 MG/1.5ML SOPN Inject into the skin.     metoprolol succinate (TOPROL-XL) 50 MG 24 hr tablet Take 1 tablet (50 mg) in the morning, and 0.5 tablet (25 mg) in the evening. 90 tablet 3   metoprolol tartrate (LOPRESSOR) 25 MG tablet Take 1 tablet by mouth every 8 hours as needed for afib HR over 120 (Patient not taking: Reported on 05/30/2022) 30 tablet 1   rosuvastatin (CRESTOR) 40 MG tablet Take 1 tablet (40 mg total) by mouth daily. 90 tablet 3   No current facility-administered  medications for this visit.    Social History   Socioeconomic History   Marital status: Divorced    Spouse name: Not on file   Number of children: Not on file   Years of education: Not on file   Highest education level: Not on file  Occupational History   Not on file  Tobacco Use   Smoking status: Never   Smokeless tobacco: Never   Tobacco comments:    Never smoke 10/24/21  Vaping Use   Vaping Use: Never used  Substance and Sexual Activity   Alcohol use: Not Currently    Alcohol/week: 6.0 - 8.0 standard drinks of alcohol    Types: 6 - 8 Cans of beer per week    Comment: no drink since 06/23/21   Drug use: No   Sexual activity: Not on file  Other Topics Concern   Not on file  Social History Narrative   Not on file   Social Determinants of Health   Financial Resource Strain: Not on file  Food Insecurity: Not on file  Transportation Needs: Not on file  Physical Activity: Not on file  Stress: Not on file  Social Connections: Not on file  Intimate Partner Violence: Not on file   Social history is notable that he is married for > 18 years.  He has one child.  He works at the [employer].  He completed 11th grade of education.  There is no history of tobacco use.  He does not routinely exercise but does walk occasionally.  Family History  Problem Relation Age of Onset   Lung cancer Father    Family history is notable that both parents are living at age 40.  Mother has issues with anxiety.  Father had  cancer of his kidney.  He is one brother age 66 and 1 sister, age 75.  His child is age 66.  ROS General: Negative; No fevers, chills, or night sweats HEENT: Negative; No changes in vision or hearing, sinus congestion, difficulty swallowing Pulmonary: Negative; No cough, wheezing, shortness of breath, hemoptysis Cardiovascular:  See HPI;  GI: Negative; No nausea, vomiting, diarrhea, or abdominal pain GU: Negative; No dysuria, hematuria, or difficulty  voiding Musculoskeletal:Status post right rotator cuff surgery. Hematologic/Oncologic: Negative; no easy bruising, bleeding Endocrine: Negative; no heat/cold intolerance; no diabetes Neuro: Negative; no changes in balance, headaches Skin: Negative; No rashes or skin lesions Psychiatric: Positive for anxiety Sleep: SA, severe, recently started on CPAP therapy was set up today August 30, 2021 [facility] as his DME company. Other comprehensive 14 point system review is negative   Physical Exam BP 118/78   Pulse 79   Ht 6' (1.829 m)   Wt 295 lb 3.2 oz (133.9 kg)   SpO2 97%   BMI 40.04 kg/m    Repeat blood pressure by me was 110/74  Wt Readings from Last 3 Encounters:  05/30/22 295 lb 3.2 oz (133.9 kg)  12/25/21 289 lb 9.6 oz (131.4 kg)  12/04/21 288 lb 9.6 oz (130.9 kg)   General: Alert, oriented, no distress.  Mesomorphic body habitus Skin: normal turgor, no rashes, warm and dry HEENT: Normocephalic, atraumatic. Pupils equal round and reactive to light; sclera anicteric; extraocular muscles intact;  Nose without nasal septal hypertrophy Mouth/Parynx benign; Mallinpatti scale 3/4 Neck: Thick neck; no JVD, no carotid bruits; normal carotid upstroke Lungs: clear to ausculatation and percussion; no wheezing or rales Chest wall: without tenderness to palpitation Heart: PMI not displaced, RRR, s1 s2 normal, 1/6 systolic murmur, no diastolic murmur, no rubs, gallops, thrills, or heaves Abdomen: soft, nontender; no hepatosplenomehaly, BS+; abdominal aorta nontender and not dilated by palpation. Back: no CVA tenderness Pulses 2+ Musculoskeletal: full range of motion, normal strength, no joint deformities Extremities: no clubbing cyanosis or edema, Homan's sign negative  Neurologic: grossly nonfocal; Cranial nerves grossly wnl Psychologic: Normal mood and affect   May 30, 2022 ECG (independently read by me): NSR at 79, STT changes   August 23, 2021 ECG  (independently read by me): Normal sinus rhythm at 68 bpm, nonspecific T changes.  December 2019 ECG (independently read by me): Normal sinus rhythm at 75 bpm, occasional QTc interval 464 ms.  PR interval 196 ms.  [facility]  June 2019 ECG (independently read by me): Sinus rhythm at 67 bpm.  Normal intervals.  No ectopy.  No ST segment changes.  May 2018 ECG (independently read by me): Normal sinus rhythm at 66 bpm.  QTc interval 425 ms, PR interval 192 ms.  No significant ST-T changes.  Mild nondiagnostic T change in lead 3.  April 2017 ECG (independently read by me): Normal sinus rhythm at 63 bpm.  Normal intervals.  January 2017 ECG (independently read by me): Normal sinus rhythm at 67 bpm.  QTc interval 452 ms.  No significant ST-T changes  LABS:     Latest Ref Rng & Units 04/27/2022    3:01 PM 09/15/2021   10:27 AM 08/24/2021   12:52 PM  BMP  Glucose 70 - 99 mg/dL 75  95  117   BUN 6 - 24 mg/dL _0 Creatinine 0.76 - 1.27 mg/dL 1.08  1.16  1.11   BUN/Creat Ratio 9 - 20 11  11  Sodium 134 - 144 mmol/L 146  140  141   Potassium 3.5 - 5.2 mmol/L 4.6  4.5  4.0   Chloride 96 - 106 mmol/L 104  103  107   CO2 20 - 29 mmol/L _0 Calcium 8.7 - 10.2 mg/dL 10.1  9.6  9.9        Latest Ref Rng & Units 04/27/2022    3:01 PM 09/15/2021   10:27 AM 07/20/2021   12:30 PM  Hepatic Function  Total Protein 6.0 - 8.5 g/dL 7.7  6.8  7.8   Albumin 4.1 - 5.1 g/dL 5.3  4.7  4.9   AST 0 - 40 IU/L 29  28  36   ALT 0 - 44 IU/L 33  46  53   Alk Phosphatase 44 - 121 IU/L 90  83  75   Total Bilirubin 0.0 - 1.2 mg/dL 1.9  1.3  2.3   Bilirubin, Direct 0.00 - 0.40 mg/dL  0.26         Latest Ref Rng & Units 09/15/2021   10:27 AM 08/24/2021   12:52 PM 07/20/2021   12:30 PM  CBC  WBC 3.4 - 10.8 x10E3/uL 5.4  6.1  8.0   Hemoglobin 13.0 - 17.7 g/dL 16.6  17.5  17.1   Hematocrit 37.5 - 51.0 % 47.3  50.6  48.0   Platelets 150 - 450 x10E3/uL 195  232  233    Lab Results  Component Value Date   MCV  91 09/15/2021   MCV 90.4 08/24/2021   MCV 90.6 07/20/2021   Lab Results  Component Value Date   TSH 4.060 09/15/2021   No results found for: "HGBA1C"   BNP No results found for: "BNP"  ProBNP No results found for: "PROBNP"   Lipid Panel     Component Value Date/Time   CHOL 160 04/27/2022 1501   TRIG 122 04/27/2022 1501   HDL 55 04/27/2022 1501   CHOLHDL 2.9 04/27/2022 1501   LDLCALC 83 04/27/2022 1501    RADIOLOGY: No results found.   ECHO: 07/06/2021 IMPRESSIONS   1. Left ventricular ejection fraction, by estimation, is 60 to 65%. The  left ventricle has normal function. The left ventricle has no regional  wall motion abnormalities. Left ventricular diastolic parameters are  consistent with Grade II diastolic  dysfunction (pseudonormalization).   2. Right ventricular systolic function is normal. The right ventricular  size is normal.   3. The mitral valve is normal in structure. Trivial mitral valve  regurgitation. No evidence of mitral stenosis.   4. The aortic valve is normal in structure. Aortic valve regurgitation is  not visualized. No aortic stenosis is present.   5. The inferior vena cava is normal in size with greater than 50%  respiratory variability, suggesting right atrial pressure of 3 mmHg.    07/18/2021 CLINICAL INFORMATION Sleep Study Type: HST   Indication for sleep study: Excessive Daytime Sleepiness, Snoring, Atrial fibrillation   Epworth Sleepiness Score: 5   SLEEP STUDY TECHNIQUE A multi-channel overnight portable sleep study was performed. The channels recorded were: nasal airflow, thoracic respiratory movement, and oxygen saturation with a pulse oximetry. Snoring was also monitored.   MEDICATIONS ALPRAZolam (XANAX) 0.5 MG tablet amiodarone (PACERONE) 200 MG tablet amoxicillin-clavulanate (AUGMENTIN) 875-125 MG tablet apixaban (ELIQUIS) 5 MG TABS tablet atorvastatin (LIPITOR) 10 MG tablet cetirizine (ZYRTEC) 10 MG  tablet fluticasone (FLONASE) 50 MCG/ACT nasal spray lisinopril (ZESTRIL) 10 MG tablet metoprolol succinate (TOPROL-XL)  50 MG 24 hr tablet metoprolol tartrate (LOPRESSOR) 25 MG tablet nitroGLYCERIN (NITROSTAT) 0.4 MG SL tablet Patient self administered medications include: N/A.   SLEEP ARCHITECTURE Patient was studied for 365.5 minutes. The sleep efficiency was 100.0 % and the patient was supine for 56.7%. The arousal index was 0.0 per hour.   RESPIRATORY PARAMETERS The overall AHI was 38.7 per hour, with a central apnea index of 0 per hour.   The oxygen nadir was 80% during sleep.   CARDIAC DATA Mean heart rate during sleep was 60.6 bpm.   IMPRESSIONS - Severe obstructive sleep apnea occurred during this study (AHI 38.7/h). There is a significant positional component with supine sleep AHI 55.3/h versus nonsupine sleep AHI 17.2/h. - Severe oxygen desaturation to a nadir of 80%. - Patient snored 19.4% during the sleep.   DIAGNOSIS - Obstructive Sleep Apnea (G47.33) - Nocturnal Hypoxemia (G47.36)   RECOMMENDATIONS - In this patient with severe sleep apnea and cardiovascular comorbidities incliuding atrial fibrillation recommend an in-lab CPAP titration study. If unable for in-lab evaluation, initiate Auto-PAP at 6 - 18 cm of water. - Effort should be made to optimize nasal and oropharyngeal patency. - Positional therapy avoiding supine position during sleep. - Avoid alcohol, sedatives and other CNS depressants that may worsen sleep apnea and disrupt normal sleep architecture. - Sleep hygiene should be reviewed to assess factors that may improve sleep quality. - Weight management (BMI 37) and regular exercise should be initiated or continued. - Recommend a download and sleep clinic evaluation after one month of therapy.  IMPRESSION:  1. Essential hypertension   2. Mixed hyperlipidemia   3. Paroxysmal atrial fibrillation [facility]): s/p AF ablation by Dr. Curt Simpson September 26, 2021   4.  OSA (obstructive sleep apnea) on CPAP: set-up 08/30/2021   5. Morbid obesity ([place])   6. Gastroesophageal reflux disease without esophagitis     ASSESSMENT AND PLAN: [name] is a 51 year-old gentleman who has a history of obesity, hypertension, OSA on CPAP and anxiety.  Remotely, he had noticed episodes of increasing shortness of breath and palpitations as well as nocturnal palpitations with snoring while sleeping.  A routine treadmill test was negative for ischemia but had demonstrated  systolic hypertension with stress.  A remote echo Doppler study in 2017 had shown normal systolic function with grade 2 diastolic dysfunction.  Remotely, I discussed sleep evaluation but he never pursued this in the past before he was working due to financial concerns.  He had developed increasing chest pain symptomatology and ultimately underwent cardiac catheterization in November 2019 and was found to have normal epicardial coronary arteries.  LVEDP was increased to 22 mm.  LV function was low normal at 50%.  Over the last several years, he had experienced palpitations intermittently.  Ultimately he developed atrial fibrillation in November 2022.  Subsequent echo Doppler study has continued to show preserved global LV contractility with EF at 60 to 65% and grade 2 diastolic dysfunction.  Cardiac monitoring has revealed bursts of short-lived SVT as well as 21% atrial fibrillation/flutter burden.  A sleep study on July 18, 2021  confirmed my suspicion for severe obstructive sleep apnea.  He was started on AutoPap therapy pressure range of 6 to 18 cm set up date on August 30, 2021.  Since CPAP initiation he has continued to have excellent compliance.  His most recent download from October 15 through May 28, 2022 shows an AHI of 0.6 with a 95th percentile pressure 11.1 with maximum average  pressure 12.6.  There is no mask leak.  He is averaging 6 hours and 42 minutes of CPAP use per night.  He underwent  successful atrial fibrillation ablation by Dr. Curt Simpson on September 26, 2021.  When seen by him on December 25, 2021 he was maintaining sinus rhythm and amiodarone and Eliquis were discontinued.  His blood pressure today is well controlled on lisinopril 10 mg, metoprolol succinate 25 mg twice a day.  With his resting pulse at 79 and periods where his heart rate increases particularly during activity I recommended that he increase his morning dose of metoprolol succinate to 50 mg but continue the 25 mg evening dose.  I reviewed recent laboratory.  Most recent total cholesterol on April 27, 2022 was 160 with triglycerides 122 HDL 55 and LDL 83.  I have recommended additional titration of rosuvastatin to 40 mg.  He recently has started Ozempic for weight loss.  BMI is 48 consistent with morbid obesity.  He is in need to undergo colonoscopy and will be given clearance for this.  I will obtain repeat chemistry and lipid studies in 3 months and plan to see him in 6 months for cardiology follow-up.   Troy Sine, MD, [facility] 06/04/2022 3:02 PM

## 2022-05-30 NOTE — Patient Instructions (Signed)
Medication Instructions:  Increase Metoprolol Succinate 1 tablet (50 mg in the morning) and 0.5 tablet (25 mg in the evening)   Increase Rosuvastatin 40 mg daily   *If you need a refill on your cardiac medications before your next appointment, please call your pharmacy*   Lab Work: CMET, LIPID (3 months)   If you have labs (blood work) drawn today and your tests are completely normal, you will receive your results only by: MyChart Message (if you have MyChart) OR A paper copy in the mail If you have any lab test that is abnormal or we need to change your treatment, we will call you to review the results.   Follow-Up: At [facility], you and your health needs are our priority.  As part of our continuing mission to provide you with exceptional heart care, we have created designated Provider Care Teams.  These Care Teams include your primary Cardiologist (physician) and Advanced Practice Providers (APPs -  Physician Assistants and Nurse Practitioners) who all work together to provide you with the care you need, when you need it.  We recommend signing up for the patient portal called "MyChart".  Sign up information is provided on this After Visit Summary.  MyChart is used to connect with patients for Virtual Visits (Telemedicine).  Patients are able to view lab/test results, encounter notes, upcoming appointments, etc.  Non-urgent messages can be sent to your provider as well.   To learn more about what you can do with MyChart, go to [email or web address].    Your next appointment:   6 month(s)  The format for your next appointment:   In Person  Provider:   Nicki Guadalajara, MD

## 2022-06-04 ENCOUNTER — Encounter: Payer: Self-pay | Admitting: Cardiovascular Disease

## 2022-06-05 ENCOUNTER — Emergency Department (HOSPITAL_BASED_OUTPATIENT_CLINIC_OR_DEPARTMENT_OTHER): Payer: BC Managed Care – PPO | Admitting: Radiology

## 2022-06-05 ENCOUNTER — Other Ambulatory Visit: Payer: Self-pay

## 2022-06-05 ENCOUNTER — Emergency Department (HOSPITAL_BASED_OUTPATIENT_CLINIC_OR_DEPARTMENT_OTHER)
Admission: EM | Admit: 2022-06-05 | Discharge: 2022-06-05 | Disposition: A | Discharge status: Home / Self Care | Payer: BC Managed Care – PPO | Attending: Emergency Medicine | Admitting: Emergency Medicine

## 2022-06-05 ENCOUNTER — Encounter (HOSPITAL_BASED_OUTPATIENT_CLINIC_OR_DEPARTMENT_OTHER): Payer: Self-pay | Admitting: Emergency Medicine

## 2022-06-05 DIAGNOSIS — I10 Essential (primary) hypertension: Secondary | ICD-10-CM | POA: Insufficient documentation

## 2022-06-05 DIAGNOSIS — R109 Unspecified abdominal pain: Secondary | ICD-10-CM | POA: Insufficient documentation

## 2022-06-05 DIAGNOSIS — R110 Nausea: Secondary | ICD-10-CM | POA: Diagnosis not present

## 2022-06-05 DIAGNOSIS — R000 Tachycardia, unspecified: Secondary | ICD-10-CM | POA: Diagnosis not present

## 2022-06-05 DIAGNOSIS — Z79899 Other long term (current) drug therapy: Secondary | ICD-10-CM | POA: Diagnosis not present

## 2022-06-05 LAB — URINALYSIS, ROUTINE W REFLEX MICROSCOPIC
Bilirubin Urine: NEGATIVE
Glucose, UA: NEGATIVE mg/dL
Hgb urine dipstick: NEGATIVE
Ketones, ur: NEGATIVE mg/dL
Leukocytes,Ua: NEGATIVE
Nitrite: NEGATIVE
Protein, ur: NEGATIVE mg/dL
Specific Gravity, Urine: <1.005 — ABNORMAL LOW (ref 1.005–1.030)
pH: 7 (ref 5.0–8.0)

## 2022-06-05 LAB — COMPREHENSIVE METABOLIC PANEL
ALT: 31 U/L (ref 0–44)
AST: 27 U/L (ref 15–41)
Albumin: 5 g/dL (ref 3.5–5.0)
Alkaline Phosphatase: 76 U/L (ref 38–126)
Anion gap: 10 (ref 5–15)
BUN: 11 mg/dL (ref 6–20)
CO2: 26 mmol/L (ref 22–32)
Calcium: 10.2 mg/dL (ref 8.9–10.3)
Chloride: 104 mmol/L (ref 98–111)
Creatinine, Ser: 1.2 mg/dL (ref 0.61–1.24)
GFR, Estimated: >60 mL/min (ref >60)
Glucose, Bld: 115 mg/dL — ABNORMAL HIGH (ref 70–99)
Potassium: 4.6 mmol/L (ref 3.5–5.1)
Sodium: 140 mmol/L (ref 135–145)
Total Bilirubin: 2.4 mg/dL — ABNORMAL HIGH (ref 0.3–1.2)
Total Protein: 7.6 g/dL (ref 6.5–8.1)

## 2022-06-05 LAB — CBC
HCT: 50.5 % (ref 39.0–52.0)
Hemoglobin: 17.6 g/dL — ABNORMAL HIGH (ref 13.0–17.0)
MCH: 32.2 pg (ref 26.0–34.0)
MCHC: 34.9 g/dL (ref 30.0–36.0)
MCV: 92.3 fL (ref 80.0–100.0)
Platelets: 185 10*3/uL (ref 150–400)
RBC: 5.47 MIL/uL (ref 4.22–5.81)
RDW: 11.9 % (ref 11.5–15.5)
WBC: 7.3 10*3/uL (ref 4.0–10.5)
nRBC: 0 % (ref 0.0–0.2)

## 2022-06-05 LAB — TROPONIN I (HIGH SENSITIVITY): Troponin I (High Sensitivity): <2 ng/L (ref <18)

## 2022-06-05 LAB — D-DIMER, QUANTITATIVE: D-Dimer, Quant: 0.27 ug/mL-FEU (ref 0.00–0.50)

## 2022-06-05 LAB — LIPASE, BLOOD: Lipase: 33 U/L (ref 11–51)

## 2022-06-05 MED ORDER — PANTOPRAZOLE SODIUM 40 MG PO TBEC
40.0000 mg | DELAYED_RELEASE_TABLET | Freq: Once | ORAL | Status: AC
  Administered 2022-06-05: 40 mg via ORAL
  Filled 2022-06-05: qty 1

## 2022-06-05 MED ORDER — LACTATED RINGERS IV BOLUS
1000.0000 mL | Freq: Once | INTRAVENOUS | Status: AC
  Administered 2022-06-05: 1000 mL via INTRAVENOUS

## 2022-06-05 MED ORDER — METOPROLOL TARTRATE 25 MG PO TABS
50.0000 mg | ORAL_TABLET | Freq: Once | ORAL | Status: AC
  Administered 2022-06-05: 50 mg via ORAL
  Filled 2022-06-05: qty 2

## 2022-06-05 MED ORDER — ONDANSETRON 4 MG PO TBDP: 4.0000 mg | ORAL_TABLET | Freq: Three times a day (TID) | ORAL | 0 refills | Status: DC | PRN

## 2022-06-05 MED ORDER — ONDANSETRON HCL 4 MG/2ML IJ SOLN
4.0000 mg | Freq: Once | INTRAMUSCULAR | Status: AC
  Administered 2022-06-05: 4 mg via INTRAVENOUS
  Filled 2022-06-05: qty 2

## 2022-06-05 MED ORDER — LISINOPRIL 10 MG PO TABS
10.0000 mg | ORAL_TABLET | Freq: Once | ORAL | Status: AC
  Administered 2022-06-05: 10 mg via ORAL
  Filled 2022-06-05: qty 1

## 2022-06-05 NOTE — Discharge Instructions (Addendum)
If you develop abdominal pain, uncontrolled vomiting, fever, chest or back pain, or any other new/concerning symptoms then return to the ER for evaluation.  

## 2022-06-05 NOTE — ED Notes (Signed)
Patient verbalizes understanding of discharge instructions. Opportunity for questioning and answers were provided. Patient discharged from ED.  °

## 2022-06-05 NOTE — ED Provider Notes (Signed)
[facility] EMERGENCY DEPT Provider Note   CSN: [id number] Arrival date & time: 06/05/22  0755     History  Chief Complaint  Patient presents with   Abdominal Pain   Nausea    Joshua Simpson is a 51 y.o. male.  HPI 51 year old male with a history of prior A-fib status post ablation, hypertension, GERD presents with nausea and abdominal discomfort.  Is ongoing for about a week.  He recently was put on Ozempic about 6 weeks ago for weight loss and wonders if this is the cause.  He is mostly feeling nauseated and having an upset stomach.  He is tried multiple meds including Mylanta, Protonix, Pepcid, Tums.  He has occasional shortness of breath but it never seems to last long and occasionally his chest feels "weird" but no specific pain.  He is noted his heart rate to be a little bit higher than typical and he just saw his cardiologist last week and was told to go up on his metoprolol from 25 mg to 50 mg in the morning though he has not yet started this.  Home Medications Prior to Admission medications   Medication Sig Start Date End Date Taking? Authorizing Provider  ondansetron (ZOFRAN-ODT) 4 MG disintegrating tablet Take 1 tablet (4 mg total) by mouth every 8 (eight) hours as needed for nausea or vomiting. 06/05/22  Yes Pricilla Loveless, MD  ALPRAZolam Prudy Feeler) 0.5 MG tablet Take 0.5 mg by mouth daily with lunch. 07/06/21   [provider]  cetirizine (ZYRTEC) 10 MG tablet Take 10 mg by mouth daily.    [provider]  lisinopril (ZESTRIL) 10 MG tablet Take 1 tablet (10 mg total) by mouth daily. 08/23/21 05/30/22  Lennette Bihari, MD  metoprolol succinate (TOPROL-XL) 50 MG 24 hr tablet Take 1 tablet (50 mg) in the morning, and 0.5 tablet (25 mg) in the evening. 05/30/22   Lennette Bihari, MD  metoprolol tartrate (LOPRESSOR) 25 MG tablet Take 1 tablet by mouth every 8 hours as needed for afib HR over 120 Patient not taking: Reported on 05/30/2022 07/19/21    Fenton, Clint R, PA  nitroGLYCERIN (NITROSTAT) 0.4 MG SL tablet Place 1 tablet (0.4 mg total) under the tongue every 5 (five) minutes as needed for chest pain. 08/10/21 05/30/22  Camnitz, Andree Coss, MD  pantoprazole (PROTONIX) 40 MG tablet Take 40 mg by mouth daily. 08/28/21   [provider]  rosuvastatin (CRESTOR) 40 MG tablet Take 1 tablet (40 mg total) by mouth daily. 05/30/22 05/25/23  Lennette Bihari, MD  Semaglutide,0.25 or 0.5MG /DOS, (OZEMPIC, 0.25 OR 0.5 MG/DOSE,) 2 MG/1.5ML SOPN Inject into the skin.    [provider]      Allergies    Trolamine salicylate    Review of Systems   Review of Systems  Respiratory:  Positive for shortness of breath.   Cardiovascular:  Positive for chest pain.  Gastrointestinal:  Positive for diarrhea (once this morning) and nausea. Negative for abdominal pain and vomiting.    Physical Exam Updated Vital Signs BP (!) 124/91   Pulse 100   Temp 98.9 F (37.2 C) (Oral)   Resp 15   SpO2 96%  Physical Exam Vitals and nursing note reviewed.  Constitutional:      Appearance: He is well-developed. He is obese.  HENT:     Head: Normocephalic and atraumatic.  Cardiovascular:     Rate and Rhythm: Normal rate and regular rhythm.     Heart sounds: Normal  heart sounds.     Comments: HR in 90s Pulmonary:     Effort: Pulmonary effort is normal.     Breath sounds: Normal breath sounds.  Abdominal:     Palpations: Abdomen is soft.     Tenderness: There is no abdominal tenderness.  Skin:    General: Skin is warm and dry.  Neurological:     Mental Status: He is alert.     ED Results / Procedures / Treatments   Labs (all labs ordered are listed, but only abnormal results are displayed) Labs Reviewed  COMPREHENSIVE METABOLIC PANEL - Abnormal; Notable for the following components:      Result Value   Glucose, Bld 115 (*)    Total Bilirubin 2.4 (*)    All other components within normal limits  CBC - Abnormal; Notable for the  following components:   Hemoglobin 17.6 (*)    All other components within normal limits  URINALYSIS, ROUTINE W REFLEX MICROSCOPIC - Abnormal; Notable for the following components:   Color, Urine COLORLESS (*)    Specific Gravity, Urine <1.005 (*)    All other components within normal limits  LIPASE, BLOOD  D-DIMER, QUANTITATIVE (NOT AT [facility])  TROPONIN I (HIGH SENSITIVITY)    EKG EKG Interpretation  Date/Time:  Tuesday June 05 2022 08:09:19 EST Ventricular Rate:  103 PR Interval:  192 QRS Duration: 80 QT Interval:  346 QTC Calculation: 453 R Axis:   52 Text Interpretation: Sinus tachycardia Nonspecific ST and T wave abnormality  rate is faster, otherwise EKG similar to April 2023 Confirmed by Pricilla Loveless [phone]) on 06/05/2022 8:13:42 AM  Radiology DG Chest 2 View  Result Date: 06/05/2022 CLINICAL DATA:  Dyspnea EXAM: CHEST - 2 VIEW COMPARISON:  08/24/2021 FINDINGS: The heart size and mediastinal contours are within normal limits. Both lungs are clear. The visualized skeletal structures are unremarkable. IMPRESSION: No acute abnormality of the lungs. Electronically Signed   By: Jearld Lesch M.D.   On: 06/05/2022 08:58    Procedures Procedures    Medications Ordered in ED Medications  lactated ringers bolus 1,000 mL (1,000 mLs Intravenous New Bag/Given 06/05/22 0912)  ondansetron (ZOFRAN) injection 4 mg (4 mg Intravenous Given 06/05/22 0911)  lisinopril (ZESTRIL) tablet 10 mg (10 mg Oral Given 06/05/22 0910)  metoprolol tartrate (LOPRESSOR) tablet 50 mg (50 mg Oral Given 06/05/22 0910)  pantoprazole (PROTONIX) EC tablet 40 mg (40 mg Oral Given 06/05/22 0910)    ED Course/ Medical Decision Making/ A&P                           Medical Decision Making Amount and/or Complexity of Data Reviewed Labs: ordered.    Details: Hemoglobin mildly elevated but consistent with other results.  Same with the mild bilirubin elevation. Radiology: ordered and independent  interpretation performed.    Details: No CHF ECG/medicine tests: independent interpretation performed.    Details: Nonspecific but unchanged ST/T changes  Risk Prescription drug management.   Patient presents with a primary complaint of nausea.  He was mildly tachycardic on arrival though this seems to be improving.  He is requesting a dose of his oral meds which were given.  He was given fluids and Zofran and is feeling better.  He is concerned about this being from Ozempic which is possible though obviously hard to prove without just stopping the Ozempic and seeing.  Otherwise, no concerning findings.  Troponin and D-dimer sent but given the length  of time of symptoms and negative results and otherwise low suspicion presentation I do not think further work-up is needed.  No leg swelling.  At this point, will send home with nausea prescription and have him follow-up with PCP.        Final Clinical Impression(s) / ED Diagnoses Final diagnoses:  Nausea    Rx / DC Orders ED Discharge Orders          Ordered    ondansetron (ZOFRAN-ODT) 4 MG disintegrating tablet  Every 8 hours PRN        06/05/22 0953              Pricilla Loveless, MD 06/05/22 1006

## 2022-06-05 NOTE — ED Triage Notes (Signed)
Pt arrives to ED with c/o epigastric abdominal pain, nausea, elevated heart rate over the past week. He notes that he feels as if his Ozempic is causing these issues.

## 2022-06-16 ENCOUNTER — Telehealth: Payer: Self-pay | Admitting: Home Health

## 2022-06-16 ENCOUNTER — Telehealth: Payer: Self-pay | Admitting: Student in an Organized Health Care Education/Training Program

## 2022-06-16 NOTE — Telephone Encounter (Signed)
Paged by operator regarding patient's AF.  Takes toprol XL 50 mg qAM and 25 mg qhs.  Currently feels occasional heart fluttering in chest.  BP stable. BP cuff says irregular rate he can feel irregular pulse on radials. Yesterday (12/01) after dinner is when he went into this.  When he takes his pulse its 70-80s all day.  Not currently on [facility] since after his PVI/CTI.  Prior to ablation he would have AF/RVR into the 140s and then he would be much more symptomatic. Not very symptomatic now in rate controlled AF. Still using CPAP nightly. He still has apixaban 5 mg at home. Asked him to resume this in case we have to do a cardioversion, he never has had cardioversion before as his AF was paroxysmal and usually self converts after 3-4 hours. No changes to his toprol XL since he is in rate controlled AF. Asked him to d/c apixaban if he self converts since we wouldn't need to perform DCCV. Since he is starting back on apixaban tonight (within ~24h of symptom onset) he could potentially avoid TEE. Message had already been sent by NP earlier in the day to EP.

## 2022-06-16 NOTE — Telephone Encounter (Signed)
Patient called after hour line, reporting that he is likely back in A fib, had ablation in March 2023, last night starting to feel his HR is irregular, had some palpitation, but symptoms were not as bad as his prior A fib, he feels well now but still noticed the HR is irregular. He states his BP machines reports his HR is at 90s, sometimes unable to provide reading. Advised patient to go to ER when feel increasingly SOB, chest pain, fatigue, weak, dizzy, etc. Otherwise, message sent to office staff to arrange him a early appt with EP office. He continues on metoprolol. He agreed.

## 2022-06-18 ENCOUNTER — Encounter: Payer: Self-pay | Admitting: Cardiovascular Disease

## 2022-06-18 ENCOUNTER — Ambulatory Visit: Payer: BC Managed Care – PPO | Attending: Cardiovascular Disease | Admitting: Cardiovascular Disease

## 2022-06-18 ENCOUNTER — Telehealth: Payer: Self-pay | Admitting: Cardiovascular Disease

## 2022-06-18 VITALS — BP 118/86 | HR 79 | Ht 72.0 in | Wt 290.0 lb

## 2022-06-18 DIAGNOSIS — I480 Paroxysmal atrial fibrillation: Secondary | ICD-10-CM | POA: Diagnosis not present

## 2022-06-18 DIAGNOSIS — I10 Essential (primary) hypertension: Secondary | ICD-10-CM

## 2022-06-18 DIAGNOSIS — I498 Other specified cardiac arrhythmias: Secondary | ICD-10-CM | POA: Diagnosis not present

## 2022-06-18 DIAGNOSIS — G4733 Obstructive sleep apnea (adult) (pediatric): Secondary | ICD-10-CM

## 2022-06-18 MED ORDER — METOPROLOL SUCCINATE ER 50 MG PO TB24: 50.0000 mg | ORAL_TABLET | Freq: Two times a day (BID) | ORAL | 3 refills | Status: DC

## 2022-06-18 NOTE — Progress Notes (Signed)
Patient ID: Joshua A Simpson, male   DOB: 1971/02/26, 51 y.o.   MRN: [id number]        Primary MD: Darrol Jump, NP Cardiology: Shelva Majestic MD EP: Dr. Curt Bears  Add- on  follow-up evaluation  HPI:  Joshua A Holness has a history of hypertension and in 2011 experience atypical chest pain.  He was evaluated by Dr. Sallyanne Kuster initially in  2011.  His chest pain was felt to be atypical and not exertionally related and most likely musculoskeletal.  It was felt that he had anxiety driven, sinus tachycardia and significant cardiac deconditioning.  His last cardiac evaluation was 5 years ago by Dr. Ellyn Hack.  The patient has issues with anxiety.  Over the last 3 weeks.  He is experiencing increased episodes of shortness of breath.  He denies chest pain.  He does have hypertension.  He has been awakened from sleep, particularly around 5 AM with his heart pounding.  He admits to very poor sleep.  His sleep is nonrestorative.  He does note some daytime sleepiness.  He snores and at times has awakened gasping for breath.  He has taken Xanax in the past for anxiety.  He has GERD for which he has taken pantoprazole.  He had recently been out of work after surgery which her surgery for [employer].  Because of his increasing symptoms associated with increased blood pressure, he was referred for cardiology evaluation.  When I saw him initially he had significant hypertension and his blood pressure was 150/110.  He had been on low-dose atenolol and I increased this to 25 mg in the morning and 12.5 mg in the evening.  I also started him on lisinopril at 5 mg daily.  He underwent an echo Doppler study which was done on 09/21/2015.  This showed an ejection fraction of 60-65% without wall motion abnormalities.  There was grade 2 diastolic dysfunction.  His aortic root was upper normal at 37 mm.  There was trivial MR.  When I saw him in April 2017 his blood pressure had stabilized and typically runs in the 259 systolic  range and in the 56L in the diastolic range at home.  He denied chest pain.  He does have anxiety issues and has taking alprazolam intermittently.  He has history of GERD for which he has been on pantoprazole.  I was concerned with sleep apnea but due to his lack of insurance he wished to defer evaluation.  He developed intermittent episodes of chest pain with atypical features which led to an emergency room evaluation February 2018.  Troponins were negative.  He did not have acute ECG changes, but he did have nonspecific lateral T-wave abnormality.  Chest x-ray did not reveal any significant abnormality.    When I saw him in May 2018 he had undergone a routine treadmill test which was negative for ischemia. He had been doing fairly well until February 2019 and he developed palpitations as well as episodes of chest discomfort episodes of awakening at night with his heart pounding and not sleeping well.  In the past I had recommended a sleep evaluation but he did decided against this.  He was seen by Jory Sims, NP for a 14-day monitor.  This revealed predominant sinus rhythm with an average rate at 60 bpm.  There were periods of sinus bradycardia, isolated PACs, and rare episodes of atrial couplets.  There was transient first-degree AV block.  There was no ventricular ectopy or episodes of atrial fibrillation or  significant pauses.  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  He had undergone cholecystectomy in January 2019.  Recently, he admits to shortness of breath with activity and admits to perspiring significantly.  He notes that sharp atypical chest pain which is nonexertional and short-lived.    He was seen by Almyra Deforest on May 15, 2018 and at that time was having worsening chest pain both at rest and with exertion over 2 months previously.  He was referred for definitive cardiac catheterization which I performed on May 26, 2018.  He  was found to have low normal LV function with EF estimated 50%.  LVEDP was increased to 22 mm.  He did not have focal segmental wall motion abnormalities.  He had normal epicardial coronary arteries.  There is a she did not identify any coronary obstructive disease.  If he continues to experience exertional chest pressure cardiopulmonary met testing to assess for possible microvascular angina was recommended.  Also an outpatient echo Doppler study was recommended to reassess LV systolic and diastolic function.  I saw him on June 17, 2018 at which time he felt well and denied any recurrent chest pain.  However, the previous Sunday he had [place] and that evening noticed his heart racing and was unable to sleep.  Retrospectively, he admits that he has experienced episodes of nocturnal palpitations.  He admits to snoring.  His sleep is nonrestorative.  He admits to daytime fatigue.  At times he also has noticed some mild ankle swelling particularly since with his new job he must wear boots to work.  During that evaluation, I recommended that he discontinue atenolol and changed to metoprolol succinate 50 mg daily.  He was on lisinopril 5 mg and had recently been started on rosuvastatin for LDL cholesterol of 143 and when he was seen by Almyra Deforest this was changed to pravastatin.  In the past I had recommended that he undergo a sleep study for high level of suspicion for obstructive sleep apnea which may be contributing to some of his nocturnal palpitations.  He was evaluated by Malka So following development of atrial fibrillation in November 2022.  Echo Doppler study on July 06, 2021 showed normal LV function with EF 60 to 65% with grade 2 diastolic dysfunction.  He was recently evaluated by him on July 21, 2021 after he presented to the emergency room with presyncope.  He wore a 2-week Zio patch monitor between January 6 and August 04, 2021 which showed predominant sinus rhythm with frequent  episodes of SVT with the fastest interval lasting 8 beats with a rate of 231 in the lab longest lasting 16.8 seconds at an average rate of 135.  There also were episodes of atrial fibrillation/flutter with a 21% burden with rates averaging from 51 to 2018 bpm.  He underwent a home sleep study on July 18, 2021 which revealed severe obstructive sleep apnea with an AHI of 38.7/h.  There was a significant positional component with supine sleep AHI 55.3 versus nonsupine sleep AHI at 17.2.  He had severe oxygen desaturation to a nadir of 80%.  He is still awaiting approval from insurance for his titration and if unable the recommendation was to initiate AutoPap initially at 6 to 18 cm of water.  He evaluated by Dr. Curt Bears on August 10, 2021.  ECG that day showed sinus rhythm.  However, with his 21% atrial fibrillation burden and on  his monitor, it was advised that he initiate Eliquis and start amiodarone at a lengthy discussion concerning future treatment resulted in scheduling him for atrial fibrillation ablation tentatively scheduled for September 26, 2021.  I saw him on August 23, 2021.  He states his episodes of atrial fibrillation in the past typically could occur for 3 hours and then resolved.  I reviewed his sleep study which showed severe sleep apnea and I have recommended that if he cannot have in lab titration that we initiate AutoPap therapy.  I reviewed his most recent echo Doppler study which showed normal systolic function with grade 2 diastolic dysfunction.  I also reviewed laboratory from June 07, 2021 which showed total cholesterol 282 triglycerides 225 and LDL cholesterol 191.  Presently, he has been on amiodarone 200 mg daily (but previously was on twice daily for 2 weeks, Eliquis 5 mg twice a day, lisinopril 10 mg, and metoprolol succinate 25 mg twice a day.    I last saw him on Dec 04, 2021.  Prior to that evaluation he had undergone a successful ablation Dr. Curt Bears on September 26, 2020 for  his paroxysmal atrial fibrillation and typical appearing atrial flutter.  He is not aware of any recurrent atrial fibrillation since and feels significantly improved.  He received a CPAP AirSense 10 unit on August 30, 2021.  He notes marked improvement in his sleep.  Initial download from February 15 through March 16 showed 100% compliance with average use at 7 hours and 5 minutes.  Pressure range was set at 6 to 18 cm and AHI was 1.3.  More recent download from April 19 through Nov 30, 2021 continues to show excellent compliance with AHI 0.7.  His 95th percentile pressure is 10.9 with maximum average pressure 12.5 he denies any chest pain.  He was started on rosuvastatin 20 mg for LDL cholesterol 164 from lab work on September 15, 2021.  Total cholesterol was 230.  He continues to be on Eliquis for anticoagulation.  He has been taking furosemide 20 mg, lisinopril 10 mg, metoprolol succinate 25 mg twice a day for blood pressure.  He is tolerating rosuvastatin.  He continues to be on amiodarone 200 mg daily.    He was evaluated on December 25, 2021 by Dr. Curt Bears and remained stable without any episodes of recurrent A-fib since his ablation.  I last saw him on May 30, 2022 at which time he continued to feel well and remained stable without palpitations, chest pain, shortness of breath PND orthopnea or lower extremity edema.  He continues to use CPAP with 100% compliance.  A download was obtained from October 15 through May 28, 2022 which shows 100% use with average use of 6 hours and 42 minutes.  His pressure is set at a range of 6 to 18 cm of water and AHI is excellent at 0.6 with 95th percentile pressure 11.1 and maximum average pressure 12.6.  He stays busy and participates in corn hole tournaments.  He needs clearance to undergo colonoscopy.  He continues to be on lisinopril 10 mg, metoprolol succinate 25 mg twice a day, rosuvastatin 20 mg, and just started on Ozempic 0.5 weekly injection.  Since I saw  him, he apparently started to notice some chest fluttering and heart rate irregularity.  His symptoms have been occurring intermittently over the past 3 days.  Due to significant concern he called the office and was worked into my schedule today for follow-up evaluation.  He continues to be on  metoprolol succinate for 50 mg in the morning and 25 mg in the evening, alprazolam 0.5 mg, Eliquis 5 mg twice daily, lisinopril 10 mg daily, rosuvastatin 40 mg hyperlipidemia and pantoprazole 40 mg for GERD.  He presents for evaluation.   Past Medical History:  Diagnosis Date   Anxiety    Atrial fibrillation ([place])    Hypertension     Past Surgical History:  Procedure Laterality Date   ATRIAL FIBRILLATION ABLATION N/A 09/26/2021   Procedure: ATRIAL FIBRILLATION ABLATION;  Surgeon: Constance Haw, MD;  Location: [facility];  Service: Cardiovascular;  Laterality: N/A;   CHOLECYSTECTOMY N/A 08/14/2017   Procedure: LAPAROSCOPIC CHOLECYSTECTOMY WITH INTRAOPERATIVE CHOLANGIOGRAM;  Surgeon: Donnie Mesa, MD;  Location: [facility];  Service: General;  Laterality: N/A;   LEFT HEART CATH AND CORONARY ANGIOGRAPHY N/A 05/26/2018   Procedure: LEFT HEART CATH AND CORONARY ANGIOGRAPHY;  Surgeon: Troy Sine, MD;  Location: [facility];  Service: Cardiovascular;  Laterality: N/A;   SHOULDER SURGERY      Allergies  Allergen Reactions   Trolamine Salicylate Rash    Current Outpatient Medications  Medication Sig Dispense Refill   ALPRAZolam (XANAX) 0.5 MG tablet Take 0.5 mg by mouth daily with lunch.     apixaban (ELIQUIS) 5 MG TABS tablet Take 5 mg by mouth 2 (two) times daily.     cetirizine (ZYRTEC) 10 MG tablet Take 10 mg by mouth daily.     metoprolol tartrate (LOPRESSOR) 25 MG tablet Take 1 tablet by mouth every 8 hours as needed for afib HR over 120 30 tablet 1   ondansetron (ZOFRAN-ODT) 4 MG disintegrating tablet Take 1 tablet (4 mg total) by mouth every 8 (eight) hours as needed for  nausea or vomiting. 10 tablet 0   pantoprazole (PROTONIX) 40 MG tablet Take 40 mg by mouth daily.     rosuvastatin (CRESTOR) 40 MG tablet Take 1 tablet (40 mg total) by mouth daily. 90 tablet 3   lisinopril (ZESTRIL) 10 MG tablet Take 1 tablet (10 mg total) by mouth daily. 90 tablet 3   metoprolol succinate (TOPROL-XL) 50 MG 24 hr tablet Take 1 tablet (50 mg total) by mouth 2 (two) times daily. 180 tablet 3   nitroGLYCERIN (NITROSTAT) 0.4 MG SL tablet Place 1 tablet (0.4 mg total) under the tongue every 5 (five) minutes as needed for chest pain. 25 tablet 4   No current facility-administered medications for this visit.    Social History   Socioeconomic History   Marital status: Divorced    Spouse name: Not on file   Number of children: Not on file   Years of education: Not on file   Highest education level: Not on file  Occupational History   Not on file  Tobacco Use   Smoking status: Never   Smokeless tobacco: Never   Tobacco comments:    Never smoke 10/24/21  Vaping Use   Vaping Use: Never used  Substance and Sexual Activity   Alcohol use: Not Currently    Alcohol/week: 6.0 - 8.0 standard drinks of alcohol    Types: 6 - 8 Cans of beer per week    Comment: no drink since 06/23/21   Drug use: No   Sexual activity: Not on file  Other Topics Concern   Not on file  Social History Narrative   Not on file   Social Determinants of Health   Financial Resource Strain: Not on file  Food Insecurity: Not on file  Transportation Needs: Not on file  Physical Activity: Not on file  Stress: Not on file  Social Connections: Not on file  Intimate Partner Violence: Not on file   Social history is notable that he is married for > 18 years.  He has one child.  He works at the [employer].  He completed 11th grade of education.  There is no history of tobacco use.  He does not routinely exercise but does walk occasionally.  Family History  Problem Relation Age of Onset   Lung  cancer Father    Family history is notable that both parents are living at age 58.  Mother has issues with anxiety.  Father had cancer of his kidney.  He is one brother age 23 and 1 sister, age 23.  His child is age 36.  ROS General: Negative; No fevers, chills, or night sweats HEENT: Negative; No changes in vision or hearing, sinus congestion, difficulty swallowing Pulmonary: Negative; No cough, wheezing, shortness of breath, hemoptysis Cardiovascular:  See HPI;  GI: Negative; No nausea, vomiting, diarrhea, or abdominal pain GU: Negative; No dysuria, hematuria, or difficulty voiding Musculoskeletal:Status post right rotator cuff surgery. Hematologic/Oncologic: Negative; no easy bruising, bleeding Endocrine: Negative; no heat/cold intolerance; no diabetes Neuro: Negative; no changes in balance, headaches Skin: Negative; No rashes or skin lesions Psychiatric: Positive for anxiety Sleep: SA, severe, recently started on CPAP therapy was set up today August 30, 2021 [facility] as his DME company. Other comprehensive 14 point system review is negative   Physical Exam BP 118/86 (BP Location: Left Arm, Patient Position: Sitting, Cuff Size: Large)   Pulse 79   Ht 6' (1.829 m)   Wt 290 lb (131.5 kg)   BMI 39.33 kg/m    Repeat blood pressure by me was 110/74  Wt Readings from Last 3 Encounters:  06/18/22 290 lb (131.5 kg)  05/30/22 295 lb 3.2 oz (133.9 kg)  12/25/21 289 lb 9.6 oz (131.4 kg)   General: Alert, oriented, no distress.  Mesomorphic body habitus Skin: normal turgor, no rashes, warm and dry HEENT: Normocephalic, atraumatic. Pupils equal round and reactive to light; sclera anicteric; extraocular muscles intact;  Nose without nasal septal hypertrophy Mouth/Parynx benign; Mallinpatti scale 3/4 Neck: Thick neck; no JVD, no carotid bruits; normal carotid upstroke Lungs: clear to ausculatation and percussion; no wheezing or rales Chest wall: without tenderness to  palpitation Heart: PMI not displaced, RRR, s1 s2 normal, 1/6 systolic murmur, no diastolic murmur, no rubs, gallops, thrills, or heaves Abdomen: soft, nontender; no hepatosplenomehaly, BS+; abdominal aorta nontender and not dilated by palpation. Back: no CVA tenderness Pulses 2+ Musculoskeletal: full range of motion, normal strength, no joint deformities Extremities: no clubbing cyanosis or edema, Homan's sign negative  Neurologic: grossly nonfocal; Cranial nerves grossly wnl Psychologic: Normal mood and affect   June 18, 2022 ECG (independently read by me):  Sinus rhythm with atrial bigeminy  May 30, 2022 ECG (independently read by me): NSR at 79, STT changes   August 23, 2021 ECG (independently read by me): Normal sinus rhythm at 68 bpm, nonspecific T changes.  December 2019 ECG (independently read by me): Normal sinus rhythm at 75 bpm, occasional QTc interval 464 ms.  PR interval 196 ms.  [facility]  June 2019 ECG (independently read by me): Sinus rhythm at 67 bpm.  Normal intervals.  No ectopy.  No ST segment changes.  May 2018 ECG (independently read by me): Normal sinus rhythm at 66 bpm.  QTc interval  425 ms, PR interval 192 ms.  No significant ST-T changes.  Mild nondiagnostic T change in lead 3.  April 2017 ECG (independently read by me): Normal sinus rhythm at 63 bpm.  Normal intervals.  January 2017 ECG (independently read by me): Normal sinus rhythm at 67 bpm.  QTc interval 452 ms.  No significant ST-T changes  LABS:     Latest Ref Rng & Units 06/05/2022    8:20 AM 04/27/2022    3:01 PM 09/15/2021   10:27 AM  BMP  Glucose 70 - 99 mg/dL 115  75  95   BUN 6 - 20 mg/dL _0 Creatinine 0.61 - 1.24 mg/dL 1.20  1.08  1.16   BUN/Creat Ratio 9 - _1 Sodium 135 - 145 mmol/L 140  146  140   Potassium 3.5 - 5.1 mmol/L 4.6  4.6  4.5   Chloride 98 - 111 mmol/L 104  104  103   CO2 22 - 32 mmol/L _2 Calcium 8.9 - 10.3 mg/dL 10.2  10.1  9.6         Latest Ref Rng & Units 06/05/2022    8:20 AM 04/27/2022    3:01 PM 09/15/2021   10:27 AM  Hepatic Function  Total Protein 6.5 - 8.1 g/dL 7.6  7.7  6.8   Albumin 3.5 - 5.0 g/dL 5.0  5.3  4.7   AST 15 - 41 U/L _3 ALT 0 - 44 U/L 31  33  46   Alk Phosphatase 38 - 126 U/L 76  90  83   Total Bilirubin 0.3 - 1.2 mg/dL 2.4  1.9  1.3   Bilirubin, Direct 0.00 - 0.40 mg/dL   0.26        Latest Ref Rng & Units 06/05/2022    8:20 AM 09/15/2021   10:27 AM 08/24/2021   12:52 PM  CBC  WBC 4.0 - 10.5 K/uL 7.3  5.4  6.1   Hemoglobin 13.0 - 17.0 g/dL 17.6  16.6  17.5   Hematocrit 39.0 - 52.0 % 50.5  47.3  50.6   Platelets 150 - 400 K/uL 185  195  232    Lab Results  Component Value Date   MCV 92.3 06/05/2022   MCV 91 09/15/2021   MCV 90.4 08/24/2021   Lab Results  Component Value Date   TSH 4.060 09/15/2021   No results found for: "HGBA1C"   BNP No results found for: "BNP"  ProBNP No results found for: "PROBNP"   Lipid Panel     Component Value Date/Time   CHOL 160 04/27/2022 1501   TRIG 122 04/27/2022 1501   HDL 55 04/27/2022 1501   CHOLHDL 2.9 04/27/2022 1501   LDLCALC 83 04/27/2022 1501    RADIOLOGY: DG Chest 2 View  Result Date: 06/05/2022 CLINICAL DATA:  Dyspnea EXAM: CHEST - 2 VIEW COMPARISON:  08/24/2021 FINDINGS: The heart size and mediastinal contours are within normal limits. Both lungs are clear. The visualized skeletal structures are unremarkable. IMPRESSION: No acute abnormality of the lungs. Electronically Signed   By: Delanna Ahmadi M.D.   On: 06/05/2022 08:58     ECHO: 07/06/2021 IMPRESSIONS   1. Left ventricular ejection fraction, by estimation, is 60 to 65%. The  left ventricle has normal function. The left ventricle has no regional  wall motion abnormalities. Left ventricular diastolic parameters are  consistent with Grade II diastolic  dysfunction (pseudonormalization).   2. Right ventricular systolic function is normal. The right ventricular   size is normal.   3. The mitral valve is normal in structure. Trivial mitral valve  regurgitation. No evidence of mitral stenosis.   4. The aortic valve is normal in structure. Aortic valve regurgitation is  not visualized. No aortic stenosis is present.   5. The inferior vena cava is normal in size with greater than 50%  respiratory variability, suggesting right atrial pressure of 3 mmHg.    07/18/2021 CLINICAL INFORMATION Sleep Study Type: HST   Indication for sleep study: Excessive Daytime Sleepiness, Snoring, Atrial fibrillation   Epworth Sleepiness Score: 5   SLEEP STUDY TECHNIQUE A multi-channel overnight portable sleep study was performed. The channels recorded were: nasal airflow, thoracic respiratory movement, and oxygen saturation with a pulse oximetry. Snoring was also monitored.   MEDICATIONS ALPRAZolam (XANAX) 0.5 MG tablet amiodarone (PACERONE) 200 MG tablet amoxicillin-clavulanate (AUGMENTIN) 875-125 MG tablet apixaban (ELIQUIS) 5 MG TABS tablet atorvastatin (LIPITOR) 10 MG tablet cetirizine (ZYRTEC) 10 MG tablet fluticasone (FLONASE) 50 MCG/ACT nasal spray lisinopril (ZESTRIL) 10 MG tablet metoprolol succinate (TOPROL-XL) 50 MG 24 hr tablet metoprolol tartrate (LOPRESSOR) 25 MG tablet nitroGLYCERIN (NITROSTAT) 0.4 MG SL tablet Patient self administered medications include: N/A.   SLEEP ARCHITECTURE Patient was studied for 365.5 minutes. The sleep efficiency was 100.0 % and the patient was supine for 56.7%. The arousal index was 0.0 per hour.   RESPIRATORY PARAMETERS The overall AHI was 38.7 per hour, with a central apnea index of 0 per hour.   The oxygen nadir was 80% during sleep.   CARDIAC DATA Mean heart rate during sleep was 60.6 bpm.   IMPRESSIONS - Severe obstructive sleep apnea occurred during this study (AHI 38.7/h). There is a significant positional component with supine sleep AHI 55.3/h versus nonsupine sleep AHI 17.2/h. - Severe oxygen  desaturation to a nadir of 80%. - Patient snored 19.4% during the sleep.   DIAGNOSIS - Obstructive Sleep Apnea (G47.33) - Nocturnal Hypoxemia (G47.36)   RECOMMENDATIONS - In this patient with severe sleep apnea and cardiovascular comorbidities incliuding atrial fibrillation recommend an in-lab CPAP titration study. If unable for in-lab evaluation, initiate Auto-PAP at 6 - 18 cm of water. - Effort should be made to optimize nasal and oropharyngeal patency. - Positional therapy avoiding supine position during sleep. - Avoid alcohol, sedatives and other CNS depressants that may worsen sleep apnea and disrupt normal sleep architecture. - Sleep hygiene should be reviewed to assess factors that may improve sleep quality. - Weight management (BMI 37) and regular exercise should be initiated or continued. - Recommend a download and sleep clinic evaluation after one month of therapy.  IMPRESSION:  1. Atrial bigeminy   2. Essential hypertension   3. Morbid obesity (HCC)   4. Paroxysmal atrial fibrillation [facility]): s/p AF ablation by Dr. Curt Bears September 26, 2021   5. OSA (obstructive sleep apnea) on CPAP: set-up 08/30/2021     ASSESSMENT AND PLAN: Mr. Joshua Weyenberg is a 51 year-old gentleman who has a history of obesity, hypertension, OSA on CPAP and anxiety.  Remotely, he had noticed episodes of increasing shortness of breath and palpitations as well as nocturnal palpitations with snoring while sleeping.  A routine treadmill test was negative for ischemia but had demonstrated  systolic hypertension with stress.  A remote echo Doppler study in 2017 had shown normal systolic function with grade 2 diastolic dysfunction.  Remotely, I discussed  sleep evaluation but he never pursued this in the past before he was working due to financial concerns.  He had developed increasing chest pain symptomatology and ultimately underwent cardiac catheterization in November 2019 and was found to have normal epicardial  coronary arteries.  LVEDP was increased to 22 mm.  LV function was low normal at 50%.  Over the last several years, he had experienced palpitations intermittently.  Ultimately he developed atrial fibrillation in November 2022.  Subsequent echo Doppler study has continued to show preserved global LV contractility with EF at 60 to 65% and grade 2 diastolic dysfunction.  Cardiac monitoring has revealed bursts of short-lived SVT as well as 21% atrial fibrillation/flutter burden.  A sleep study on July 18, 2021  confirmed my suspicion for severe obstructive sleep apnea.  He was started on AutoPap therapy pressure range of 6 to 18 cm set up date on August 30, 2021.  Since CPAP initiation he has continued to have excellent compliance.  His recent download from October 15 through May 28, 2022 showed an AHI of 0.6 with a 95th percentile pressure 11.1 with maximum average pressure 12.6.  There is no mask leak.  He is averaging 6 hours and 42 minutes of CPAP use per night.  He underwent successful atrial fibrillation ablation by Dr. Curt Bears on September 26, 2021.  When seen by him on December 25, 2021 he was maintaining sinus rhythm and amiodarone and Eliquis were discontinued.  When I saw him in follow-up on May 30, 2022 his blood pressure was controlled and resting pulse was 79 and he noted his heart rate increasing particularly during activity.  At that time I suggested slight titration of metoprolol to 50 mg in the morning and continue the 25 mg evening dose.  He has been started recently on Ozempic for weight loss.  On June 16, 2022 he began to notice some heart rate fluttering throughout the day.  He has been experiencing 3 days of heart rate irregularity.  He was worked into my schedule today.  Blood pressure is stable at 118/86.  EKG today shows sinus rhythm with an atrial bigeminal pattern.  I reassured him that he was not back in atrial fibrillation.  I have recommended further titration of metoprolol to 50  mg twice a day.  I reviewed laboratory from June 05, 2022.  Hemoglobin and hematocrit were stable with hemoglobin 17.6.  Creatinine was 1.2.  Potassium 4.6.  He has been successful with weight loss with BMI decreased today to 39.3.  He continues to use CPAP.  He will contact us if he continues to experience arrhythmia but I will schedule him to see either Dr. Curt Bears or A-fib clinic in 1 to 2 weeks following his medication adjustment.     Troy Sine, MD, [facility] 06/25/2022 4:52 PM

## 2022-06-18 NOTE — Telephone Encounter (Signed)
STAT if HR is under 50 or over 120 (normal HR is 60-100 beats per minute)  What is your heart rate? 70  Do you have a log of your heart rate readings (document readings)? No  Do you have any other symptoms? Light CP and SOB See note from 12/2

## 2022-06-18 NOTE — Patient Instructions (Signed)
Medication Instructions:   INCREASE Metoprolol Succinate to 50 mg 2 times a day  *If you need a refill on your cardiac medications before your next appointment, please call your pharmacy*  Lab Work: NONE ordered at this time of appointment   If you have labs (blood work) drawn today and your tests are completely normal, you will receive your results only by: MyChart Message (if you have MyChart) OR A paper copy in the mail If you have any lab test that is abnormal or we need to change your treatment, we will call you to review the results.  Testing/Procedures: NONE ordered at this time of appointment   Follow-Up: At [facility], you and your health needs are our priority.  As part of our continuing mission to provide you with exceptional heart care, we have created designated Provider Care Teams.  These Care Teams include your primary Cardiologist (physician) and Advanced Practice Providers (APPs -  Physician Assistants and Nurse Practitioners) who all work together to provide you with the care you need, when you need it.   Your next appointment:   1-2 week(s)  The format for your next appointment:   In Person  Provider:   Dr. Elberta Fortis or Afib clinic      Other Instructions  Important Information About Sugar

## 2022-06-18 NOTE — Telephone Encounter (Signed)
Patient stated he has had irregular heart beat since Friday evening (ongoing). He called on-call cardiology and was given prescription for eliquis 5mg  twice daily. Has a little bit of SOB. Denies dizziness. Stated BP and P are normal. Stated he does not like feeling this way (palpitations). He is staying hydrated. Appointment made with Dr. for this afternoon. Recommended that if he feels like symptoms are getting worse, to go to the ED. He verbalized understanding.

## 2022-06-25 ENCOUNTER — Encounter: Payer: Self-pay | Admitting: Cardiovascular Disease

## 2022-07-06 ENCOUNTER — Telehealth: Payer: Self-pay | Admitting: Cardiology

## 2022-07-06 ENCOUNTER — Ambulatory Visit: Payer: BC Managed Care – PPO | Attending: Cardiology | Admitting: Cardiology

## 2022-07-06 ENCOUNTER — Encounter: Payer: Self-pay | Admitting: Cardiology

## 2022-07-06 VITALS — BP 124/80 | HR 71 | Ht 72.0 in | Wt 294.2 lb

## 2022-07-06 DIAGNOSIS — I480 Paroxysmal atrial fibrillation: Secondary | ICD-10-CM | POA: Diagnosis not present

## 2022-07-06 MED ORDER — METOPROLOL SUCCINATE ER 100 MG PO TB24: 100.0000 mg | ORAL_TABLET | Freq: Every day | ORAL | 2 refills | Status: DC

## 2022-07-06 NOTE — Telephone Encounter (Signed)
Pt c/o medication issue:  1. Name of Medication:  metoprolol succinate (TOPROL-XL) 100 MG 24 hr tablet   2. How are you currently taking this medication (dosage and times per day)? Need to verify instructions  3. Are you having a reaction (difficulty breathing--STAT)? no  4. What is your medication issue? Joshua Simpson with [facility] states they received 2 prescriptions for the medication. She says the instructions just say to take 1 tablet daily, but one had a quantity of 90 and the other 180. They would like to verify if the patient is supposed to take 1 tablet daily or 2 tablets daily. Phone: [phone]

## 2022-07-06 NOTE — Patient Instructions (Signed)
Medication Instructions:  Your physician has recommended you make the following change in your medication:  INCREASE Toprol to 100 mg twice daily  *If you need a refill on your cardiac medications before your next appointment, please call your pharmacy*   Lab Work: None ordered If you have labs (blood work) drawn today and your tests are completely normal, you will receive your results only by: MyChart Message (if you have MyChart) OR A paper copy in the mail If you have any lab test that is abnormal or we need to change your treatment, we will call you to review the results.   Testing/Procedures: None ordered   Follow-Up: At [facility], you and your health needs are our priority.  As part of our continuing mission to provide you with exceptional heart care, we have created designated Provider Care Teams.  These Care Teams include your primary Cardiologist (physician) and Advanced Practice Providers (APPs -  Physician Assistants and Nurse Practitioners) who all work together to provide you with the care you need, when you need it.  Your next appointment:   3 month(s)  The format for your next appointment:   In Person  Provider:   Loman Brooklyn, MD    Thank you for choosing [facility]!!   Dory Horn, RN [phone]  Other Instructions   Important Information About Sugar

## 2022-07-06 NOTE — Telephone Encounter (Signed)
.    PACs: In a pattern of atrial bigeminy.  He was reassured that this is not a dangerous arrhythmia.  He is mildly symptomatic.  Will increase Toprol-XL to 100 mg twice daily.  If he does not feel better in the next 2 weeks, we will plan for a monitor to assess his PAC burden.  Above is from Dr Elberta Fortis documentation from OV 07/06/22.  Joni Reining at [facility] is aware.

## 2022-07-06 NOTE — Progress Notes (Signed)
Electrophysiology Office Note   Date:  07/06/2022   ID:  Joshua Simpson, DOB August 14, 1970, MRN [id number]  PCP:  Erskine Emery, NP  Cardiologist:  Tresa Endo Primary Electrophysiologist:  Pryce Folts Jorja Loa, MD    Chief Complaint: AF   History of Present Illness: Joshua Simpson is a 51 y.o. male who is being seen today for the evaluation of AF at the request of Erskine Emery, NP. Presenting today for electrophysiology evaluation.  History significant hypertension, atrial fibrillation, sleep apnea.  He was diagnosed with atrial fibrillation 06/07/2021 after presenting to his PCP.  He wore a monitor that showed a 20.1% burden.  He is now post ablation 09/26/2021.  He read presented to his cardiologist office with palpitations and was noted to be in atrial bigeminy.  Today, denies symptoms of chest pain, shortness of breath, orthopnea, PND, lower extremity edema, claudication, dizziness, presyncope, syncope, bleeding, or neurologic sequela. The patient is tolerating medications without difficulties.  He is currently feeling well.  He does note palpitations.  Palpitations started when he got a promotion at work.  He is under quite a bit more stress.  He states that he takes Xanax which improved his palpitations somewhat.  He is not checking his heart rate as much as he did when he was having atrial fibrillation, though it does make him quite anxious.   Past Medical History:  Diagnosis Date   Anxiety    Atrial fibrillation (HCC)    Hypertension    Past Surgical History:  Procedure Laterality Date   ATRIAL FIBRILLATION ABLATION N/A 09/26/2021   Procedure: ATRIAL FIBRILLATION ABLATION;  Surgeon: Regan Lemming, MD;  Location: [facility] INVASIVE CV LAB;  Service: Cardiovascular;  Laterality: N/A;   CHOLECYSTECTOMY N/A 08/14/2017   Procedure: LAPAROSCOPIC CHOLECYSTECTOMY WITH INTRAOPERATIVE CHOLANGIOGRAM;  Surgeon: Manus Rudd, MD;  Location: [facility] OR;  Service: General;  Laterality: N/A;   LEFT  HEART CATH AND CORONARY ANGIOGRAPHY N/A 05/26/2018   Procedure: LEFT HEART CATH AND CORONARY ANGIOGRAPHY;  Surgeon: Lennette Bihari, MD;  Location: [facility] INVASIVE CV LAB;  Service: Cardiovascular;  Laterality: N/A;   SHOULDER SURGERY       Current Outpatient Medications  Medication Sig Dispense Refill   ALPRAZolam (XANAX) 0.5 MG tablet Take 0.5 mg by mouth daily with lunch.     cetirizine (ZYRTEC) 10 MG tablet Take 10 mg by mouth daily.     pantoprazole (PROTONIX) 40 MG tablet Take 40 mg by mouth daily.     rosuvastatin (CRESTOR) 40 MG tablet Take 1 tablet (40 mg total) by mouth daily. 90 tablet 3   lisinopril (ZESTRIL) 10 MG tablet Take 1 tablet (10 mg total) by mouth daily. 90 tablet 3   metoprolol succinate (TOPROL-XL) 100 MG 24 hr tablet Take 1 tablet (100 mg total) by mouth daily. Take with or immediately following a meal. 180 tablet 2   nitroGLYCERIN (NITROSTAT) 0.4 MG SL tablet Place 1 tablet (0.4 mg total) under the tongue every 5 (five) minutes as needed for chest pain. 25 tablet 4   No current facility-administered medications for this visit.    Allergies:   Trolamine salicylate   Social History:  The patient  reports that he has never smoked. He has never used smokeless tobacco. He reports that he does not currently use alcohol after a past usage of about 6.0 - 8.0 standard drinks of alcohol per week. He reports that he does not use drugs.   Family History:  The patient's  family history includes Lung cancer in his father.   ROS:  Please see the history of present illness.   Otherwise, review of systems is positive for none.   All other systems are reviewed and negative.   PHYSICAL EXAM: VS:  BP 124/80   Pulse 71   Ht 6' (1.829 m)   Wt 294 lb 3.2 oz (133.4 kg)   SpO2 97%   BMI 39.90 kg/m  , BMI Body mass index is 39.9 kg/m. GEN: Well nourished, well developed, in no acute distress  HEENT: normal  Neck: no JVD, carotid bruits, or masses Cardiac: RRR; no murmurs, rubs, or  gallops,no edema  Respiratory:  clear to auscultation bilaterally, normal work of breathing GI: soft, nontender, nondistended, + BS MS: no deformity or atrophy  Skin: warm and dry Neuro:  Strength and sensation are intact Psych: euthymic mood, full affect  EKG:  EKG is ordered today. Personal review of the ekg ordered shows sinus rhythm, PACs   Recent Labs: 07/08/2021: Magnesium 2.1 09/15/2021: TSH 4.060 06/05/2022: ALT 31; BUN 11; Creatinine, Ser 1.20; Hemoglobin 17.6; Platelets 185; Potassium 4.6; Sodium 140    Lipid Panel     Component Value Date/Time   CHOL 160 04/27/2022 1501   TRIG 122 04/27/2022 1501   HDL 55 04/27/2022 1501   CHOLHDL 2.9 04/27/2022 1501   LDLCALC 83 04/27/2022 1501     Wt Readings from Last 3 Encounters:  07/06/22 294 lb 3.2 oz (133.4 kg)  06/18/22 290 lb (131.5 kg)  05/30/22 295 lb 3.2 oz (133.9 kg)      Other studies Reviewed: Additional studies/ records that were reviewed today include: TTE 07/06/21  Review of the above records today demonstrates:   1. Left ventricular ejection fraction, by estimation, is 60 to 65%. The  left ventricle has normal function. The left ventricle has no regional  wall motion abnormalities. Left ventricular diastolic parameters are  consistent with Grade II diastolic  dysfunction (pseudonormalization).   2. Right ventricular systolic function is normal. The right ventricular  size is normal.   3. The mitral valve is normal in structure. Trivial mitral valve  regurgitation. No evidence of mitral stenosis.   4. The aortic valve is normal in structure. Aortic valve regurgitation is  not visualized. No aortic stenosis is present.   5. The inferior vena cava is normal in size with greater than 50%  respiratory variability, suggesting right atrial pressure of 3 mmHg.   Cardiac monitor 08/08/2021 personally reviewed Predominant underlying rhythm was sinus rhythm Multiple SVT episodes, all less than 18 seconds 21%  atrial fibrillation/flutter burden Less than 1% ventricular and supraventricular ectopy Triggered events associated with atrial fibrillation/flutter and PACs   ASSESSMENT AND PLAN:  1.  Paroxysmal atrial fibrillation: CHA2DS2-VASc of 1.  Currently on Eliquis 5 mg twice daily.  No further episodes of atrial fibrillation.  No changes.  2.  Obesity: Weight loss encouraged Body mass index is 39.9 kg/m.  3.  Obstructive sleep apnea: CPAP compliance encouraged  4.  Hypertension: well controlled  5.  PACs: In a pattern of atrial bigeminy.  He was reassured that this is not a dangerous arrhythmia.  He is mildly symptomatic.  Deryk Bozman increase Toprol-XL to 100 mg twice daily.  If he does not feel better in the next 2 weeks, we Oniya Mandarino plan for a monitor to assess his PAC burden.  Current medicines are reviewed at length with the patient today.   The patient does not have concerns regarding  his medicines.  The following changes were made today: Increase metoprolol  Labs/ tests ordered today include:  Orders Placed This Encounter  Procedures   EKG 12-Lead     Disposition:   FU 3 months  Signed, Ember Gottwald Jorja Loa, MD  07/06/2022 10:58 AM     [facility] [address] [place] [address] [phone] (office) [phone] (fax)

## 2022-07-21 LAB — LAB REPORT - SCANNED: EGFR: 73.4

## 2022-07-23 ENCOUNTER — Telehealth: Payer: Self-pay | Admitting: Cardiology

## 2022-07-23 DIAGNOSIS — I480 Paroxysmal atrial fibrillation: Secondary | ICD-10-CM

## 2022-07-23 DIAGNOSIS — I498 Other specified cardiac arrhythmias: Secondary | ICD-10-CM

## 2022-07-23 MED ORDER — METOPROLOL SUCCINATE ER 100 MG PO TB24: 100.0000 mg | ORAL_TABLET | Freq: Two times a day (BID) | ORAL | 1 refills | Status: DC

## 2022-07-23 NOTE — Telephone Encounter (Signed)
Pt reports he cut Toprol back b/c his HRs were in the low 60s, sometimes upper 50s in the morning when waking. Pt made aware 43s were normal HR numbers, and that 50s are a problem unless he is symptomatic. Pt denies having symptoms w/ morning HRs. Pt advised to go back to taking Toprol 100 mg BID.  Pt agreeable. Updated medication list as well (it still reflected taking once daily) Aware 2 week monitor will be ordered and mailed to his home address. Aware will determine treatment plan once monitor completed/reviewed by MD. Patient verbalized understanding and agreeable to plan.

## 2022-07-23 NOTE — Telephone Encounter (Signed)
Patient c/o Palpitations:  High priority if patient c/o lightheadedness, shortness of breath, or chest pain  How long have you had palpitations/irregular HR/ Afib? Are you having the symptoms now? Yes   Are you currently experiencing lightheadedness, SOB or CP? No   Do you have a history of afib (atrial fibrillation) or irregular heart rhythm? No   Have you checked your BP or HR? (document readings if available): no   Are you experiencing any other symptoms? No. Patient said that he was told if medication did not work, he would have to wear a heart monitor. Please call back to discuss.

## 2022-07-24 ENCOUNTER — Ambulatory Visit: Payer: BC Managed Care – PPO | Attending: Cardiology

## 2022-07-24 ENCOUNTER — Emergency Department (HOSPITAL_BASED_OUTPATIENT_CLINIC_OR_DEPARTMENT_OTHER)
Admission: EM | Admit: 2022-07-24 | Discharge: 2022-07-24 | Disposition: A | Discharge status: Home / Self Care | Payer: BC Managed Care – PPO | Attending: Emergency Medicine | Admitting: Emergency Medicine

## 2022-07-24 ENCOUNTER — Encounter (HOSPITAL_BASED_OUTPATIENT_CLINIC_OR_DEPARTMENT_OTHER): Payer: Self-pay | Admitting: Emergency Medicine

## 2022-07-24 ENCOUNTER — Other Ambulatory Visit: Payer: Self-pay

## 2022-07-24 ENCOUNTER — Emergency Department (HOSPITAL_BASED_OUTPATIENT_CLINIC_OR_DEPARTMENT_OTHER): Payer: BC Managed Care – PPO | Admitting: Radiology

## 2022-07-24 DIAGNOSIS — I498 Other specified cardiac arrhythmias: Secondary | ICD-10-CM

## 2022-07-24 DIAGNOSIS — R079 Chest pain, unspecified: Secondary | ICD-10-CM | POA: Diagnosis not present

## 2022-07-24 DIAGNOSIS — I10 Essential (primary) hypertension: Secondary | ICD-10-CM | POA: Insufficient documentation

## 2022-07-24 DIAGNOSIS — Z79899 Other long term (current) drug therapy: Secondary | ICD-10-CM | POA: Diagnosis not present

## 2022-07-24 DIAGNOSIS — I480 Paroxysmal atrial fibrillation: Secondary | ICD-10-CM

## 2022-07-24 LAB — CBC
HCT: 46.6 % (ref 39.0–52.0)
Hemoglobin: 16.8 g/dL (ref 13.0–17.0)
MCH: 32.9 pg (ref 26.0–34.0)
MCHC: 36.1 g/dL — ABNORMAL HIGH (ref 30.0–36.0)
MCV: 91.2 fL (ref 80.0–100.0)
Platelets: 220 10*3/uL (ref 150–400)
RBC: 5.11 MIL/uL (ref 4.22–5.81)
RDW: 12.2 % (ref 11.5–15.5)
WBC: 8.1 10*3/uL (ref 4.0–10.5)
nRBC: 0 % (ref 0.0–0.2)

## 2022-07-24 LAB — BASIC METABOLIC PANEL
Anion gap: 11 (ref 5–15)
BUN: 15 mg/dL (ref 6–20)
CO2: 25 mmol/L (ref 22–32)
Calcium: 9.9 mg/dL (ref 8.9–10.3)
Chloride: 104 mmol/L (ref 98–111)
Creatinine, Ser: 1.21 mg/dL (ref 0.61–1.24)
GFR, Estimated: >60 mL/min (ref >60)
Glucose, Bld: 111 mg/dL — ABNORMAL HIGH (ref 70–99)
Potassium: 3.8 mmol/L (ref 3.5–5.1)
Sodium: 140 mmol/L (ref 135–145)

## 2022-07-24 LAB — TROPONIN I (HIGH SENSITIVITY)
Troponin I (High Sensitivity): <2 ng/L (ref <18)
Troponin I (High Sensitivity): <2 ng/L (ref <18)

## 2022-07-24 NOTE — Discharge Instructions (Signed)
The test today in the ED were reassuring.  Continue your current medications.  Follow-up with your cardiologist for further eval ration.  Return to the ED for worsening symptoms.

## 2022-07-24 NOTE — Progress Notes (Unsigned)
Enrolled for [employer] to mail a ZIO XT long term holter monitor to the patients address on file.  

## 2022-07-24 NOTE — ED Provider Notes (Signed)
[facility] EMERGENCY DEPT Provider Note   CSN: [id number] Arrival date & time: 07/24/22  1705     History  Chief Complaint  Patient presents with   Chest Pain    Joshua Simpson is a 52 y.o. male.   Chest Pain    Patient has a history of atrial fibrillation.  He also has hypertension hypercholesterolemia and acid reflux.  He presents to the ED with complaints of intermittent chest discomfort.  Patient states he has been having some chest aching ongoing for about an hour or so coming and going throughout the day.  Patient states he has some shortness of breath with that and feels slightly diaphoretic.  He does have history of atrial fibrillation is concerned that might be coming back.  He has had an ablation in the past.  He denies any history of heart disease.  He has had a slight cough.  No leg swelling.  No fevers.  Home Medications Prior to Admission medications   Medication Sig Start Date End Date Taking? Authorizing Provider  ALPRAZolam Prudy Feeler) 0.5 MG tablet Take 0.5 mg by mouth daily with lunch. 07/06/21   [provider]  cetirizine (ZYRTEC) 10 MG tablet Take 10 mg by mouth daily.    [provider]  lisinopril (ZESTRIL) 10 MG tablet Take 1 tablet (10 mg total) by mouth daily. 08/23/21 05/30/22  Lennette Bihari, MD  metoprolol succinate (TOPROL-XL) 100 MG 24 hr tablet Take 1 tablet (100 mg total) by mouth 2 (two) times daily. Take with or immediately following a meal. 07/23/22   Camnitz, Andree Coss, MD  nitroGLYCERIN (NITROSTAT) 0.4 MG SL tablet Place 1 tablet (0.4 mg total) under the tongue every 5 (five) minutes as needed for chest pain. 08/10/21 05/30/22  Camnitz, Andree Coss, MD  pantoprazole (PROTONIX) 40 MG tablet Take 40 mg by mouth daily. 08/28/21   [provider]  rosuvastatin (CRESTOR) 40 MG tablet Take 1 tablet (40 mg total) by mouth daily. 05/30/22 05/25/23  Lennette Bihari, MD      Allergies    Trolamine salicylate    Review  of Systems   Review of Systems  Cardiovascular:  Positive for chest pain.    Physical Exam Updated Vital Signs BP 122/89 (BP Location: Left Arm)   Pulse 76   Temp 97.9 F (36.6 C)   Resp 16   SpO2 96%  Physical Exam Vitals and nursing note reviewed.  Constitutional:      General: He is not in acute distress.    Appearance: He is well-developed.  HENT:     Head: Normocephalic and atraumatic.     Right Ear: External ear normal.     Left Ear: External ear normal.  Eyes:     General: No scleral icterus.       Right eye: No discharge.        Left eye: No discharge.     Conjunctiva/sclera: Conjunctivae normal.  Neck:     Trachea: No tracheal deviation.  Cardiovascular:     Rate and Rhythm: Normal rate and regular rhythm.  Pulmonary:     Effort: Pulmonary effort is normal. No respiratory distress.     Breath sounds: Normal breath sounds. No stridor. No wheezing or rales.  Abdominal:     General: Bowel sounds are normal. There is no distension.     Palpations: Abdomen is soft.     Tenderness: There is no abdominal tenderness. There is no guarding or rebound.  Musculoskeletal:  General: No tenderness or deformity.     Cervical back: Neck supple.  Skin:    General: Skin is warm and dry.     Findings: No rash.  Neurological:     General: No focal deficit present.     Mental Status: He is alert.     Cranial Nerves: No cranial nerve deficit, dysarthria or facial asymmetry.     Sensory: No sensory deficit.     Motor: No abnormal muscle tone or seizure activity.     Coordination: Coordination normal.  Psychiatric:        Mood and Affect: Mood normal.     ED Results / Procedures / Treatments   Labs (all labs ordered are listed, but only abnormal results are displayed) Labs Reviewed  BASIC METABOLIC PANEL - Abnormal; Notable for the following components:      Result Value   Glucose, Bld 111 (*)    All other components within normal limits  CBC - Abnormal; Notable  for the following components:   MCHC 36.1 (*)    All other components within normal limits  TROPONIN I (HIGH SENSITIVITY)  TROPONIN I (HIGH SENSITIVITY)    EKG EKG Interpretation  Date/Time:  Tuesday July 24 2022 17:15:29 EST Ventricular Rate:  79 PR Interval:  196 QRS Duration: 84 QT Interval:  394 QTC Calculation: 451 R Axis:   23 Text Interpretation: Normal sinus rhythm Nonspecific T wave abnormality Abnormal ECG When compared with ECG of 05-Jun-2022 08:09, No significant change was found Confirmed by Linwood Dibbles [phone]) on 07/24/2022 5:18:10 PM  Radiology DG Chest 2 View  Result Date: 07/24/2022 CLINICAL DATA:  Chest pain and shortness of breath EXAM: CHEST - 2 VIEW COMPARISON:  06/05/2022 FINDINGS: The heart size and mediastinal contours are within normal limits. Both lungs are clear. The visualized skeletal structures are unremarkable. Air-fluid level along the stomach beneath the left hemidiaphragm. IMPRESSION: No acute cardiopulmonary disease Electronically Signed   By: Karen Kays M.D.   On: 07/24/2022 17:39    Procedures Procedures    Medications Ordered in ED Medications - No data to display  ED Course/ Medical Decision Making/ A&P Clinical Course as of 07/24/22 2046  Tue Jul 24, 2022  1810 Troponin I (High Sensitivity) Initial troponin normal [JK]  1810 CBC(!) Normal [JK]  1810 Basic metabolic panel(!) Normal [JK]  2031 Troponin I (High Sensitivity) Serial troponins normal [JK]    Clinical Course User Index [JK] Linwood Dibbles, MD           HEART Score: 4                Medical Decision Making Problems Addressed: Chest pain, unspecified type: acute illness or injury that poses a threat to life or bodily functions  Amount and/or Complexity of Data Reviewed Labs: ordered. Decision-making details documented in ED Course. Radiology: ordered and independent interpretation performed.   Patient presented to the ED for evaluation of chest pain.  Patient has  cardiac risk factors.  Moderate risk heart score but ED workup overall reassuring with normal troponins.  Doubt acute coronary syndrome.  Symptoms not suggestive of pulmonary embolism.  No pneumonia or pneumothorax on x-ray.  Patient is feeling better.  I think he is stable for discharge and close outpatient follow-up.  Cardiology referral placed        Final Clinical Impression(s) / ED Diagnoses Final diagnoses:  Chest pain, unspecified type    Rx / DC Orders ED Discharge Orders  Ordered    Ambulatory referral to Cardiology       Comments: If you have not heard from the Cardiology office within the next 72 hours please call [phone].   07/24/22 2043              Dorie Rank, MD 07/24/22 2046

## 2022-07-24 NOTE — ED Triage Notes (Signed)
Chest pain, sob, periods of diaphoresis. Started this morning. Comes and goes.

## 2022-07-29 DIAGNOSIS — I498 Other specified cardiac arrhythmias: Secondary | ICD-10-CM | POA: Diagnosis not present

## 2022-07-29 DIAGNOSIS — I480 Paroxysmal atrial fibrillation: Secondary | ICD-10-CM | POA: Diagnosis not present

## 2022-08-23 ENCOUNTER — Encounter (HOSPITAL_COMMUNITY): Payer: Self-pay | Admitting: *Deleted

## 2022-09-28 ENCOUNTER — Ambulatory Visit: Payer: BC Managed Care – PPO | Attending: Cardiology | Admitting: Cardiology

## 2022-09-28 ENCOUNTER — Encounter: Payer: Self-pay | Admitting: Cardiology

## 2022-09-28 VITALS — BP 126/84 | HR 73 | Ht 72.0 in | Wt 298.0 lb

## 2022-09-28 DIAGNOSIS — I491 Atrial premature depolarization: Secondary | ICD-10-CM

## 2022-09-28 DIAGNOSIS — I480 Paroxysmal atrial fibrillation: Secondary | ICD-10-CM

## 2022-09-28 DIAGNOSIS — G4733 Obstructive sleep apnea (adult) (pediatric): Secondary | ICD-10-CM | POA: Diagnosis not present

## 2022-09-28 DIAGNOSIS — I10 Essential (primary) hypertension: Secondary | ICD-10-CM | POA: Diagnosis not present

## 2022-09-28 NOTE — Patient Instructions (Signed)
Medication Instructions:  °Your physician recommends that you continue on your current medications as directed. Please refer to the Current Medication list given to you today. ° °*If you need a refill on your cardiac medications before your next appointment, please call your pharmacy* ° ° °Lab Work: °None ordered ° ° °Testing/Procedures: °None ordered ° ° °Follow-Up: °At [facility], you and your health needs are our priority.  As part of our continuing mission to provide you with exceptional heart care, we have created designated Provider Care Teams.  These Care Teams include your primary Cardiologist (physician) and Advanced Practice Providers (APPs -  Physician Assistants and Nurse Practitioners) who all work together to provide you with the care you need, when you need it. ° °Your next appointment:   °6 month(s) ° °The format for your next appointment:   °In Person ° °Provider:   °Will Camnitz, MD ° ° ° °Thank you for choosing [facility]!! ° ° °Jaquille Kau, RN °[phone] °  °

## 2022-09-28 NOTE — Progress Notes (Signed)
Electrophysiology Office Note   Date:  09/28/2022   ID:  Joshua A Bowron, DOB 01/30/1971, MRN [id number]  PCP:  Rhea Bleacher, NP  Cardiologist:  Claiborne Billings Primary Electrophysiologist:  Raysha Tilmon Meredith Leeds, MD    Chief Complaint: AF   History of Present Illness: Joshua Simpson is a 52 y.o. male who is being seen today for the evaluation of AF at the request of Rhea Bleacher, NP. Presenting today for electrophysiology evaluation.  He has a history significant for hypertension, atrial fibrillation, sleep apnea.  He was diagnosed with atrial fibrillation 06/07/2021 after presenting to the PCP.  He wore a cardiac monitor that showed a 20% burden.  He is now post ablation 09/26/2021.  He continued to have palpitations and was noted to be in atrial bigeminy.  His metoprolol was increased with a monitor showing a less than 1% PAC burden.  Today, denies symptoms of palpitations, chest pain, shortness of breath, orthopnea, PND, lower extremity edema, claudication, dizziness, presyncope, syncope, bleeding, or neurologic sequela. The patient is tolerating medications without difficulties.  Since being seen he has done well.  He has occasional palpitations, but overall has remained in sinus rhythm.     Past Medical History:  Diagnosis Date   Anxiety    Atrial fibrillation ([place])    Hypertension    Past Surgical History:  Procedure Laterality Date   ATRIAL FIBRILLATION ABLATION N/A 09/26/2021   Procedure: ATRIAL FIBRILLATION ABLATION;  Surgeon: Constance Haw, MD;  Location: [facility];  Service: Cardiovascular;  Laterality: N/A;   CHOLECYSTECTOMY N/A 08/14/2017   Procedure: LAPAROSCOPIC CHOLECYSTECTOMY WITH INTRAOPERATIVE CHOLANGIOGRAM;  Surgeon: Donnie Mesa, MD;  Location: [facility];  Service: General;  Laterality: N/A;   LEFT HEART CATH AND CORONARY ANGIOGRAPHY N/A 05/26/2018   Procedure: LEFT HEART CATH AND CORONARY ANGIOGRAPHY;  Surgeon: Troy Sine, MD;  Location: [facility];  Service: Cardiovascular;  Laterality: N/A;   SHOULDER SURGERY       Current Outpatient Medications  Medication Sig Dispense Refill   ALPRAZolam (XANAX) 0.5 MG tablet Take 0.5 mg by mouth 3 (three) times daily as needed for anxiety.     cetirizine (ZYRTEC) 10 MG tablet Take 10 mg by mouth daily.     metoprolol succinate (TOPROL-XL) 100 MG 24 hr tablet Take 1 tablet (100 mg total) by mouth 2 (two) times daily. Take with or immediately following a meal. (Patient taking differently: Take 100 mg by mouth 2 (two) times daily. 75 mg twice daily) 180 tablet 1   pantoprazole (PROTONIX) 40 MG tablet Take 40 mg by mouth daily.     rosuvastatin (CRESTOR) 40 MG tablet Take 1 tablet (40 mg total) by mouth daily. 90 tablet 3   lisinopril (ZESTRIL) 10 MG tablet Take 1 tablet (10 mg total) by mouth daily. 90 tablet 3   nitroGLYCERIN (NITROSTAT) 0.4 MG SL tablet Place 1 tablet (0.4 mg total) under the tongue every 5 (five) minutes as needed for chest pain. 25 tablet 4   No current facility-administered medications for this visit.    Allergies:   Trolamine salicylate   Social History:  The patient  reports that he has never smoked. He has never used smokeless tobacco. He reports that he does not currently use alcohol after a past usage of about 6.0 - 8.0 standard drinks of alcohol per week. He reports that he does not use drugs.   Family History:  The patient's family history includes Lung cancer  in his father.   ROS:  Please see the history of present illness.   Otherwise, review of systems is positive for none.   All other systems are reviewed and negative.   PHYSICAL EXAM: VS:  BP 126/84   Pulse 73   Ht 6' (1.829 m)   Wt 298 lb (135.2 kg)   SpO2 96%   BMI 40.42 kg/m  , BMI Body mass index is 40.42 kg/m. GEN: Well nourished, well developed, in no acute distress  HEENT: normal  Neck: no JVD, carotid bruits, or masses Cardiac: RRR; no murmurs, rubs, or gallops,no edema  Respiratory:   clear to auscultation bilaterally, normal work of breathing GI: soft, nontender, nondistended, + BS MS: no deformity or atrophy  Skin: warm and dry Neuro:  Strength and sensation are intact Psych: euthymic mood, full affect  EKG:  EKG is ordered today. Personal review of the ekg ordered shows sinus rhythm   Recent Labs: 06/05/2022: ALT 31 07/24/2022: BUN 15; Creatinine, Ser 1.21; Hemoglobin 16.8; Platelets 220; Potassium 3.8; Sodium 140    Lipid Panel     Component Value Date/Time   CHOL 160 04/27/2022 1501   TRIG 122 04/27/2022 1501   HDL 55 04/27/2022 1501   CHOLHDL 2.9 04/27/2022 1501   LDLCALC 83 04/27/2022 1501     Wt Readings from Last 3 Encounters:  09/28/22 298 lb (135.2 kg)  07/06/22 294 lb 3.2 oz (133.4 kg)  06/18/22 290 lb (131.5 kg)      Other studies Reviewed: Additional studies/ records that were reviewed today include: TTE 07/06/21  Review of the above records today demonstrates:   1. Left ventricular ejection fraction, by estimation, is 60 to 65%. The  left ventricle has normal function. The left ventricle has no regional  wall motion abnormalities. Left ventricular diastolic parameters are  consistent with Grade II diastolic  dysfunction (pseudonormalization).   2. Right ventricular systolic function is normal. The right ventricular  size is normal.   3. The mitral valve is normal in structure. Trivial mitral valve  regurgitation. No evidence of mitral stenosis.   4. The aortic valve is normal in structure. Aortic valve regurgitation is  not visualized. No aortic stenosis is present.   5. The inferior vena cava is normal in size with greater than 50%  respiratory variability, suggesting right atrial pressure of 3 mmHg.   Cardiac monitor 08/16/2022 personally reviewed Prominent rhythm was sinus rhythm Less than 1% ventricular and supraventricular ectopy burden Triggered episodes associated with sinus rhythm  ASSESSMENT AND PLAN:  1.  Paroxysmal  atrial fibrillation: CHA2DS2-VASc of 1.  Currently on Eliquis.  Status post ablation 09/26/2021.  In sinus rhythm.  No changes.  2.  Obesity: Lifestyle modification encouraged Body mass index is 40.42 kg/m.  3.  Obstructive sleep apnea: CPAP compliance encouraged  4.  Hypertension: Currently well-controlled  5.  PACs: Was found to be in bigeminy.  Metoprolol increased at last visit.  Recent cardiac monitor with a less than 1% burden.   Current medicines are reviewed at length with the patient today.   The patient does not have concerns regarding his medicines.  The following changes were made today: None  Labs/ tests ordered today include:  Orders Placed This Encounter  Procedures   EKG 12-Lead     Disposition:   FU 6 months  Signed, Shawntel Farnworth Meredith Leeds, MD  09/28/2022 3:11 PM     [facility] [address] [place] [address] [phone] (  office) [phone] (fax)

## 2022-12-02 ENCOUNTER — Telehealth: Payer: Self-pay | Admitting: Student

## 2022-12-02 NOTE — Telephone Encounter (Signed)
   Patient called After Hours Line with concerns that he was back in atrial fibrillation. Called and spoke with patient. He has a history of atrial fibrillation and underwent an ablation in 09/2022. He has been doing well since then with just some PACs. However, he says yesterday he feels like he went back into atrial fibrillation. He reports palpitations with irregularly heart rate with associated weakness and mild shortness of breath. He describes an intermittent atypical twiinge near is left collar bone that will last a few seconds and then resolve but no real chest pain. He denies any lightheadedness, dizziness, or syncope. He states his heart rates was as high as the 120s this morning but currently in the 70s on his pulse oximeter. BP was in the 120s/70s this morning. It does sound like he may be make in atrial fibrillation. He states this is similar to how he has previously felt while in atrial fibrillation but less severe. He takes Toprol-XL 75mg  twice daily. Advised patient to continue this current dose for now (given recent monitor showed average heart rates in the 60s but as low as 50)  but can increase to 100mg  twice daily if heart rates consistently >100s (he has both 100mg  and 25mg  tablets at home). I will also send a message to the [facility] (he has seen them before) to see if we can get him a visit earlier this coming week (he is leaving to go to the beach on Thursday). However, advised patient to go to the ED if he has any worsening symtpoms (progressive shortness so breath, orthopnea, chest pain, dizziness, near syncope) prior to visit. He voiced understanding and thanked me for calling.  Corrin Parker, PA-C 12/02/2022 5:08 PM

## 2022-12-03 ENCOUNTER — Ambulatory Visit (HOSPITAL_COMMUNITY)
Admission: RE | Admit: 2022-12-03 | Discharge: 2022-12-03 | Disposition: A | Discharge status: Home / Self Care | Payer: BC Managed Care – PPO | Source: Ambulatory Visit | Attending: Physician Assistant | Admitting: Physician Assistant

## 2022-12-03 VITALS — BP 122/90 | HR 69 | Ht 72.0 in | Wt 297.0 lb

## 2022-12-03 DIAGNOSIS — I4892 Unspecified atrial flutter: Secondary | ICD-10-CM | POA: Insufficient documentation

## 2022-12-03 DIAGNOSIS — G4733 Obstructive sleep apnea (adult) (pediatric): Secondary | ICD-10-CM | POA: Diagnosis not present

## 2022-12-03 DIAGNOSIS — E669 Obesity, unspecified: Secondary | ICD-10-CM | POA: Diagnosis not present

## 2022-12-03 DIAGNOSIS — Z7182 Exercise counseling: Secondary | ICD-10-CM | POA: Insufficient documentation

## 2022-12-03 DIAGNOSIS — Z9049 Acquired absence of other specified parts of digestive tract: Secondary | ICD-10-CM | POA: Insufficient documentation

## 2022-12-03 DIAGNOSIS — I10 Essential (primary) hypertension: Secondary | ICD-10-CM | POA: Diagnosis not present

## 2022-12-03 DIAGNOSIS — Z6841 Body Mass Index (BMI) 40.0 and over, adult: Secondary | ICD-10-CM | POA: Insufficient documentation

## 2022-12-03 DIAGNOSIS — I480 Paroxysmal atrial fibrillation: Secondary | ICD-10-CM | POA: Diagnosis present

## 2022-12-03 NOTE — Progress Notes (Signed)
Primary Care Physician: Erskine Emery, NP Primary Cardiologist: Dr Tresa Endo Primary Electrophysiologist: Dr Elberta Fortis Referring Physician: [facility]   Joshua Simpson is a 52 y.o. male with a history of HTN, OSA, and atrial fibrillation who presents for follow up in the [facility].  The patient was initially diagnosed with atrial fibrillation 06/07/21 after presenting to his PCP with sinus congestion. ECG showed rapid afib and he was sent to the ED. He was discharged in rate controlled afib. He has a long history of PACs and palpitations. Patient has a CHADS2VASC score of 1. He converted to SR after leaving the ED. He drinks alcohol occasionally but does report snoring and daytime somnolence. He has been diagnosed with severe OSA. He was seen by Dr Elberta Fortis and started on amiodarone as a bridge to ablation. He underwent afib and flutter ablation with Dr Elberta Fortis on 09/26/21.  On follow up today, patient reports that on 12/02/22 he woke with tachypalpitations. His symptoms lasted for about 2 hours. His heart rates fluctuated between 70-120 bpm. He admits he has been drinking more alcohol on the weekends lately. He is in SR today.   Today, he denies symptoms of shortness of breath, orthopnea, PND, lower extremity edema, presyncope, syncope, bleeding, or neurologic sequela. The patient is tolerating medications without difficulties and is otherwise without complaint today.    Atrial Fibrillation Risk Factors:  he does have symptoms or diagnosis of sleep apnea. he is compliant with CPAP therapy.  he does not have a history of rheumatic fever. he does have a history of alcohol use. The patient does have a history of early familial atrial fibrillation or other arrhythmias. Mother, father, sister have afib.  he has a BMI of Body mass index is 40.28 kg/m.Marland Kitchen Filed Weights   12/03/22 1555  Weight: 134.7 kg    Family History  Problem Relation Age of Onset   Lung  cancer Father      Atrial Fibrillation Management history:  Previous antiarrhythmic drugs: flecainide, amiodarone   Previous cardioversions: none Previous ablations: 09/26/21 CHADS2VASC score: 1 Anticoagulation history: Eliquis   Past Medical History:  Diagnosis Date   Anxiety    Atrial fibrillation ([facility])    Hypertension    Past Surgical History:  Procedure Laterality Date   ATRIAL FIBRILLATION ABLATION N/A 09/26/2021   Procedure: ATRIAL FIBRILLATION ABLATION;  Surgeon: Regan Lemming, MD;  [facility]: [facility];  Service: Cardiovascular;  Laterality: N/A;   CHOLECYSTECTOMY N/A 08/14/2017   Procedure: LAPAROSCOPIC CHOLECYSTECTOMY WITH INTRAOPERATIVE CHOLANGIOGRAM;  Surgeon: Manus Rudd, MD;  Location: [facility];  Service: General;  Laterality: N/A;   LEFT HEART CATH AND CORONARY ANGIOGRAPHY N/A 05/26/2018   Procedure: LEFT HEART CATH AND CORONARY ANGIOGRAPHY;  Surgeon: Lennette Bihari, MD;  Location: [facility];  Service: Cardiovascular;  Laterality: N/A;   SHOULDER SURGERY      Current Outpatient Medications  Medication Sig Dispense Refill   ALPRAZolam (XANAX) 0.5 MG tablet Take 0.5 mg by mouth 3 (three) times daily as needed for anxiety.     cetirizine (ZYRTEC) 10 MG tablet Take 10 mg by mouth daily.     lisinopril (ZESTRIL) 10 MG tablet Take 1 tablet (10 mg total) by mouth daily. 90 tablet 3   metoprolol succinate (TOPROL-XL) 100 MG 24 hr tablet Take 1 tablet (100 mg total) by mouth 2 (two) times daily. Take with or immediately following a meal. (Patient taking differently: Take 75 mg by  mouth 2 (two) times daily. 75 mg twice daily) 180 tablet 1   nitroGLYCERIN (NITROSTAT) 0.4 MG SL tablet Place 1 tablet (0.4 mg total) under the tongue every 5 (five) minutes as needed for chest pain. 25 tablet 4   pantoprazole (PROTONIX) 40 MG tablet Take 40 mg by mouth daily.     rosuvastatin (CRESTOR) 40 MG tablet Take 1 tablet (40 mg total) by mouth daily. 90 tablet 3    No current facility-administered medications for this encounter.    Allergies  Allergen Reactions   Trolamine Salicylate Rash    Social History   Socioeconomic History   Marital status: Divorced    Spouse name: Not on file   Number of children: Not on file   Years of education: Not on file   Highest education level: Not on file  Occupational History   Not on file  Tobacco Use   Smoking status: Never   Smokeless tobacco: Never   Tobacco comments:    Never smoke 10/24/21  Vaping Use   Vaping Use: Never used  Substance and Sexual Activity   Alcohol use: Not Currently    Alcohol/week: 6.0 - 8.0 standard drinks of alcohol    Types: 6 - 8 Cans of beer per week    Comment: no drink since 06/23/21   Drug use: No   Sexual activity: Not on file  Other Topics Concern   Not on file  Social History Narrative   Not on file   Social Determinants of Health   Financial Resource Strain: Not on file  Food Insecurity: Not on file  Transportation Needs: Not on file  Physical Activity: Not on file  Stress: Not on file  Social Connections: Not on file  Intimate Partner Violence: Not on file     ROS- All systems are reviewed and negative except as per the HPI above.  Physical Exam: Vitals:   12/03/22 1555  Weight: 134.7 kg  Height: 6' (1.829 m)    GEN- The patient is a well appearing male, alert and oriented x 3 today.   HEENT-head normocephalic, atraumatic, sclera clear, conjunctiva pink, hearing intact, trachea midline. Lungs- Clear to ausculation bilaterally, normal work of breathing Heart- Regular rate and rhythm, no murmurs, rubs or gallops  GI- soft, NT, ND, + BS Extremities- no clubbing, cyanosis, or edema MS- no significant deformity or atrophy Skin- no rash or lesion Psych- euthymic mood, full affect Neuro- strength and sensation are intact   Wt Readings from Last 3 Encounters:  12/03/22 134.7 kg  09/28/22 135.2 kg  07/06/22 133.4 kg    EKG today  demonstrates SR, NST Vent. rate 69 BPM PR interval 186 ms QRS duration 84 ms QT/QTcB 418/447 ms   Echo 07/06/21 demonstrated   1. Left ventricular ejection fraction, by estimation, is 60 to 65%. The  left ventricle has normal function. The left ventricle has no regional  wall motion abnormalities. Left ventricular diastolic parameters are  consistent with Grade II diastolic dysfunction (pseudonormalization).   2. Right ventricular systolic function is normal. The right ventricular  size is normal.   3. The mitral valve is normal in structure. Trivial mitral valve  regurgitation. No evidence of mitral stenosis.   4. The aortic valve is normal in structure. Aortic valve regurgitation is  not visualized. No aortic stenosis is present.   5. The inferior vena cava is normal in size with greater than 50%  respiratory variability, suggesting right atrial pressure of 3 mmHg.  Epic records are reviewed at length today  CHA2DS2-VASc Score = 1  The patient's score is based upon: CHF History: 0 HTN History: 1 Diabetes History: 0 Stroke History: 0 Vascular Disease History: 0 Age Score: 0 Gender Score: 0        ASSESSMENT AND PLAN: 1. Paroxysmal Atrial Fibrillation/atrial flutter The patient's CHA2DS2-VASc score is 1, indicating a 0.6% annual risk of stroke.   S/p afib and flutter ablation 09/26/21 Patient maintaining SR. This was his first episode since his ablation.  Continue Toprol 75 mg BID We discussed relationship between alcohol and afib, recommend no more than 3 drinks in a week.   2. Obesity Body mass index is 40.28 kg/m. Lifestyle modification was discussed and encouraged including regular physical activity and weight reduction.  3. OSA Severe OSA Followed by Dr Tresa Endo. Encouraged compliance with CPAP therapy.  4. HTN Stable, no changes today.   Follow up with Dr Elberta Fortis as scheduled.    Jorja Loa PA-C Af[facility] Clinic [facility] [address] [place], [place] [address] [phone] 12/03/2022 4:16 PM

## 2023-02-06 ENCOUNTER — Telehealth: Payer: Self-pay | Admitting: Cardiology

## 2023-02-06 NOTE — Telephone Encounter (Signed)
On-call outpatient service line: Medication question/bee stings  Patient called today stating that he had just been stung approximately 30 minutes ago by 7 yellow jackets.  No prior history of anaphylaxis or allergic reactions before.  His main concern was whether or not he could take his home regimen of medications and his Toprol-XL.  Blood pressure 130/80.  Heart rate 80.  After discussing with patient there are no signs of anaphylaxis.  Patient denied any shortness of breath, chest pain, chest tightness, difficulty breathing, redness, itchiness, swelling.  I advised patient that if any of these aforementioned signs were to occur to immediately seek emergency care.  In addition I told patient that it was okay to continue home regimen of medications.  Patient voices agreement and understanding.

## 2023-02-27 ENCOUNTER — Encounter: Payer: Self-pay | Admitting: Cardiology

## 2023-02-28 ENCOUNTER — Other Ambulatory Visit: Payer: Self-pay

## 2023-03-01 ENCOUNTER — Telehealth: Payer: Self-pay

## 2023-03-01 ENCOUNTER — Telehealth: Payer: Self-pay | Admitting: Student

## 2023-03-01 MED ORDER — METOPROLOL SUCCINATE ER 25 MG PO TB24: 25.0000 mg | ORAL_TABLET | Freq: Two times a day (BID) | ORAL | 3 refills | Status: DC

## 2023-03-01 MED ORDER — METOPROLOL SUCCINATE ER 50 MG PO TB24: 50.0000 mg | ORAL_TABLET | Freq: Two times a day (BID) | ORAL | 3 refills | Status: DC

## 2023-03-01 NOTE — Addendum Note (Signed)
Addended by: Luellen Pucker on: 03/01/2023 05:41 PM   Modules accepted: Orders

## 2023-03-01 NOTE — Telephone Encounter (Signed)
Patient wants 50 mg and 25 mg tablets so he doesn't have to cut up 100 mg tablets to get the dose he prefers. Sent to pharmacy per Dr. Elberta Fortis.

## 2023-03-01 NOTE — Telephone Encounter (Signed)
   Patient called Answering Service with concerns that he is back in atrial fibrillation. Called and spoke with patient. He has a history of atrial fibrillation and underwent an ablation in 09/2021. He states his heart rate is currently in the 140s and has been so for about 1 hour. BP in the 140/90s. He took his even dose of Toprol-XL around 6:30pm. However, heart rates are still in the 140s. He is minimally symptomatic with this. He denies any chest pain, shortness of breath, dizziness, or syncope. He states he just hasn't felt great today and reports a little fatigue/ sluggishness. He is worried about having a stroke because he is no longer on anticoagulation. His wife is currently admitted at [facility] and he is currently there visiting her so recommended he go down to the ED there for at least an EKG. Patient voiced understanding and agreed. He was very thankful for the call.  Corrin Parker, PA-C 03/01/2023 8:17 PM

## 2023-03-04 ENCOUNTER — Encounter: Payer: Self-pay | Admitting: Student

## 2023-03-04 ENCOUNTER — Ambulatory Visit: Payer: BC Managed Care – PPO | Attending: Student | Admitting: Student

## 2023-03-04 VITALS — BP 110/78 | HR 67 | Ht 72.0 in | Wt 298.6 lb

## 2023-03-04 DIAGNOSIS — G4733 Obstructive sleep apnea (adult) (pediatric): Secondary | ICD-10-CM | POA: Diagnosis not present

## 2023-03-04 DIAGNOSIS — I480 Paroxysmal atrial fibrillation: Secondary | ICD-10-CM

## 2023-03-04 DIAGNOSIS — I10 Essential (primary) hypertension: Secondary | ICD-10-CM

## 2023-03-04 MED ORDER — METOPROLOL SUCCINATE ER 100 MG PO TB24: 100.0000 mg | ORAL_TABLET | Freq: Two times a day (BID) | ORAL | Status: DC

## 2023-03-04 MED ORDER — APIXABAN 5 MG PO TABS: 5.0000 mg | ORAL_TABLET | Freq: Two times a day (BID) | ORAL | 3 refills | Status: DC

## 2023-03-04 NOTE — Progress Notes (Signed)
  Electrophysiology Office Note:   Date:  03/04/2023  ID:  Joshua A Colston, DOB 06-14-1971, MRN [id number]  Primary Cardiologist: Nicki Guadalajara, MD Electrophysiologist: Regan Lemming, MD      History of Present Illness:   Joshua Simpson is a 52 y.o. male with h/o anxiety, HTN, OSA on CPAP, Atrial fibrillation and atrial flutter seen today for acute visit due to recurrent AF.    Patient reports over the past couple of weeks he has had several episodes of breakthrough AF in the setting of stress. His wife broke her ankle, and while he was waiting with her in the ED felt tachy-palpitations and had HRs in the 140s. He himself presented then to the ED. EKG picture not available, but reports says AFL with 2:1 AV conduction at 143 bpm.   Pt denies ETOH use in the past 3 weeks, only caffeine through occasional chocolate.  Otherwise, he is doing well. Thinks prior to these clusters he has only had AF 3-4 times since ablation.   Review of systems complete and found to be negative unless listed in HPI.   EP Information / Studies Reviewed:    EKG is ordered today. Personal review as below.  EKG Interpretation Date/Time:  Monday March 04 2023 10:50:07 EDT Ventricular Rate:  67 PR Interval:  190 QRS Duration:  84 QT Interval:  424 QTC Calculation: 448 R Axis:   28  Text Interpretation: Normal sinus rhythm Confirmed by Maxine Glenn [phone]) on 03/04/2023 10:58:28 AM      AF history PVI + CTI 09/26/2021  Physical Exam:   VS:  BP 110/78   Pulse 67   Ht 6' (1.829 m)   Wt 298 lb 9.6 oz (135.4 kg)   SpO2 97%   BMI 40.50 kg/m    Wt Readings from Last 3 Encounters:  03/04/23 298 lb 9.6 oz (135.4 kg)  12/03/22 297 lb (134.7 kg)  09/28/22 298 lb (135.2 kg)     GEN: Well nourished, well developed in no acute distress NECK: No JVD; No carotid bruits CARDIAC: Regular rate and rhythm, no murmurs, rubs, gallops RESPIRATORY:  Clear to auscultation without rales, wheezing or rhonchi  ABDOMEN:  Soft, non-tender, non-distended EXTREMITIES:  No edema; No deformity   ASSESSMENT AND PLAN:    Paroxysmal atrial fibrillation Paroxysmal atrial flutter Breakthrough arrhyhtmia in 130-140s EKG from outside hospital says flutter, but unable to review.  Possibly triggered by stress Will resume Eliquis 5 mg BID for now, so that definitive management is not delayed.  Increase toprol to 100 mg BID We discussed flecainide and Tikosyn, he wishes to try and avoid new medications if he can.  We briefly discuss ablation, but would likely need to define the rhythm better first. It could potentially be an atypical flutter given prior CTI, and ablation may have less yield.   OSA  Encouraged nightly CPAP   Obesity Body mass index is 40.5 kg/m.  Encouraged lifestyle modification   Follow up with Dr. Elberta Fortis as scheduled to further assess and discuss  Signed, Graciella Freer, PA-C

## 2023-03-04 NOTE — Patient Instructions (Signed)
Medication Instructions:  1.Increase metoprolol succinate (Toprol XL) to 100 mg twice daily 2.Stop aspirin 81 mg 3.Take eliquis 5 mg twice daily *If you need a refill on your cardiac medications before your next appointment, please call your pharmacy*  Lab Work: None ordered If you have labs (blood work) drawn today and your tests are completely normal, you will receive your results only by: MyChart Message (if you have MyChart) OR A paper copy in the mail If you have any lab test that is abnormal or we need to change your treatment, we will call you to review the results.  Follow-Up: At [facility], you and your health needs are our priority.  As part of our continuing mission to provide you with exceptional heart care, we have created designated Provider Care Teams.  These Care Teams include your primary Cardiologist (physician) and Advanced Practice Providers (APPs -  Physician Assistants and Nurse Practitioners) who all work together to provide you with the care you need, when you need it.  Your next appointment:   04/02/23 at 4:15 PM  Provider:   Loman Brooklyn, MD

## 2023-03-08 ENCOUNTER — Telehealth: Payer: Self-pay | Admitting: Cardiology

## 2023-03-08 MED ORDER — DABIGATRAN ETEXILATE MESYLATE 150 MG PO CAPS: 150.0000 mg | ORAL_CAPSULE | Freq: Two times a day (BID) | ORAL | 5 refills | Status: DC

## 2023-03-08 NOTE — Telephone Encounter (Signed)
Spoke with pt and advised per Mardelle Matte pt can stop Eliquis tomorrow and begin Dabigatran 150mg  - 1 capsule by mouth twice daily and additional information as below.  Pt verbalizes understanding and states he will obtain EKG from [facility].          Dr. Estill Dooms said that Pradaxa is fine for now, also no clear reason to put on Zio for now if not having very often.  Is there any way for the patient to get an actual copy of the EKG from [facility]?   We're unable to access it, but if he can get a copy it will help [place] with the plan of what to do next.

## 2023-03-08 NOTE — Telephone Encounter (Signed)
  Per Answering Service Message:  Needing cheaper prescription for Eliquis, has pills for tomorrow, please call

## 2023-03-08 NOTE — Telephone Encounter (Signed)
Spoke with Joshua Simpson who reports he has spoken with his insurance company and cost of Eliquis will be $571 until his deductible is met.  Discussed warfarin/coumadin vs Pradaxa using Good Rx coupon.  Joshua Simpson is open to Pradaxa if Dr Elberta Fortis feels medication change is appropriate.  Joshua Simpson advised will forward to Dr Elberta Fortis and Otilio Saber, PA-C for further review and recommendation.  Joshua Simpson thanked Charity fundraiser for the call.

## 2023-03-18 ENCOUNTER — Telehealth: Payer: Self-pay | Admitting: Home Health

## 2023-03-18 NOTE — Telephone Encounter (Signed)
Patient called after-hours line, states he is currently diagnosed with a UTI, started on Cipro, EKF was done at his doctor office and was told "OK".  He is questioning if Cipro and Pradaxa can be taken concurrently.  Advised patient there is no drug-drug interaction between the 2 medications.  He reports he has been feeling poor over the past few days, yesterday noted he was in atrial fibrillation with heart rate of 130s to 140s for 3 hours with some lightheadedness and heart palpitations.  Today he noted his heart rate is at 130s with blood pressure 110/80, continue to feel not great.  He does not have any significant shortness of breath, chest pain, syncope.  Explained to the patient the atrial fibrillation has potential for higher recurrence and faster ventricular rate in the setting of bladder infection.  Advised patient to take his antibiotic and maintain adequate hydration.  Advised patient to continue monitor his symptoms and heart rate and blood pressure.  If he notes persistent elevated heart rate and continued worsening of symptoms, may go to the nearest ER for evaluation.  If he feels overall stable, may call EP office tomorrow for sooner appointment to discuss AAD therapy (this was discussed during recent encounter but he declined). He is agreeable.

## 2023-03-20 NOTE — Telephone Encounter (Signed)
Dr. Elberta Fortis -- pt is already scheduled to see you on 9/17

## 2023-03-21 ENCOUNTER — Other Ambulatory Visit (HOSPITAL_BASED_OUTPATIENT_CLINIC_OR_DEPARTMENT_OTHER): Payer: Self-pay

## 2023-03-21 ENCOUNTER — Emergency Department (HOSPITAL_BASED_OUTPATIENT_CLINIC_OR_DEPARTMENT_OTHER)
Admission: EM | Admit: 2023-03-21 | Discharge: 2023-03-21 | Disposition: A | Discharge status: Home / Self Care | Payer: BC Managed Care – PPO | Attending: Emergency Medicine | Admitting: Emergency Medicine

## 2023-03-21 ENCOUNTER — Encounter (HOSPITAL_BASED_OUTPATIENT_CLINIC_OR_DEPARTMENT_OTHER): Payer: Self-pay | Admitting: Emergency Medicine

## 2023-03-21 ENCOUNTER — Emergency Department (HOSPITAL_BASED_OUTPATIENT_CLINIC_OR_DEPARTMENT_OTHER): Payer: BC Managed Care – PPO

## 2023-03-21 ENCOUNTER — Other Ambulatory Visit: Payer: Self-pay

## 2023-03-21 DIAGNOSIS — N390 Urinary tract infection, site not specified: Secondary | ICD-10-CM | POA: Diagnosis not present

## 2023-03-21 DIAGNOSIS — D72829 Elevated white blood cell count, unspecified: Secondary | ICD-10-CM | POA: Diagnosis not present

## 2023-03-21 DIAGNOSIS — R109 Unspecified abdominal pain: Secondary | ICD-10-CM | POA: Insufficient documentation

## 2023-03-21 DIAGNOSIS — B9689 Other specified bacterial agents as the cause of diseases classified elsewhere: Secondary | ICD-10-CM | POA: Insufficient documentation

## 2023-03-21 DIAGNOSIS — R300 Dysuria: Secondary | ICD-10-CM | POA: Diagnosis present

## 2023-03-21 LAB — COMPREHENSIVE METABOLIC PANEL
ALT: 22 U/L (ref 0–44)
AST: 18 U/L (ref 15–41)
Albumin: 4.1 g/dL (ref 3.5–5.0)
Alkaline Phosphatase: 80 U/L (ref 38–126)
Anion gap: 9 (ref 5–15)
BUN: 9 mg/dL (ref 6–20)
CO2: 24 mmol/L (ref 22–32)
Calcium: 9.1 mg/dL (ref 8.9–10.3)
Chloride: 104 mmol/L (ref 98–111)
Creatinine, Ser: 1.01 mg/dL (ref 0.61–1.24)
GFR, Estimated: >60 mL/min (ref >60)
Glucose, Bld: 122 mg/dL — ABNORMAL HIGH (ref 70–99)
Potassium: 3.9 mmol/L (ref 3.5–5.1)
Sodium: 137 mmol/L (ref 135–145)
Total Bilirubin: 2.1 mg/dL — ABNORMAL HIGH (ref 0.3–1.2)
Total Protein: 7.6 g/dL (ref 6.5–8.1)

## 2023-03-21 LAB — URINALYSIS, W/ REFLEX TO CULTURE (INFECTION SUSPECTED)
Bilirubin Urine: NEGATIVE
Glucose, UA: NEGATIVE mg/dL
Ketones, ur: NEGATIVE mg/dL
Nitrite: NEGATIVE
Protein, ur: NEGATIVE mg/dL
Specific Gravity, Urine: 1.015 (ref 1.005–1.030)
pH: 6 (ref 5.0–8.0)

## 2023-03-21 LAB — CBC WITH DIFFERENTIAL/PLATELET
Abs Immature Granulocytes: 0.02 10*3/uL (ref 0.00–0.07)
Basophils Absolute: 0.1 10*3/uL (ref 0.0–0.1)
Basophils Relative: 1 %
Eosinophils Absolute: 0.2 10*3/uL (ref 0.0–0.5)
Eosinophils Relative: 2 %
HCT: 45.8 % (ref 39.0–52.0)
Hemoglobin: 16.1 g/dL (ref 13.0–17.0)
Immature Granulocytes: 0 %
Lymphocytes Relative: 13 %
Lymphs Abs: 1.4 10*3/uL (ref 0.7–4.0)
MCH: 32.3 pg (ref 26.0–34.0)
MCHC: 35.2 g/dL (ref 30.0–36.0)
MCV: 92 fL (ref 80.0–100.0)
Monocytes Absolute: 1 10*3/uL (ref 0.1–1.0)
Monocytes Relative: 10 %
Neutro Abs: 8 10*3/uL — ABNORMAL HIGH (ref 1.7–7.7)
Neutrophils Relative %: 74 %
Platelets: 214 10*3/uL (ref 150–400)
RBC: 4.98 MIL/uL (ref 4.22–5.81)
RDW: 11.8 % (ref 11.5–15.5)
WBC: 10.7 10*3/uL — ABNORMAL HIGH (ref 4.0–10.5)
nRBC: 0 % (ref 0.0–0.2)

## 2023-03-21 MED ORDER — SODIUM CHLORIDE 0.9 % IV BOLUS
1000.0000 mL | Freq: Once | INTRAVENOUS | Status: AC
  Administered 2023-03-21: 1000 mL via INTRAVENOUS

## 2023-03-21 MED ORDER — CEFDINIR 300 MG PO CAPS
300.0000 mg | ORAL_CAPSULE | Freq: Two times a day (BID) | ORAL | 0 refills | Status: DC
  Filled 2023-03-21: qty 28, 14d supply, fill #0

## 2023-03-21 MED ORDER — CEFDINIR 300 MG PO CAPS: 300.0000 mg | ORAL_CAPSULE | Freq: Two times a day (BID) | ORAL | 0 refills | Status: AC

## 2023-03-21 NOTE — ED Triage Notes (Signed)
Reports dysuria , right lower abd pain , flank pain . Was seen at [facility] , dx with UTI , cipro x 4 days . Reports new onset chills and sweats , lethargic with persistent pain lower abd .

## 2023-03-21 NOTE — ED Provider Notes (Signed)
[facility] Provider Note   CSN: [id number] Arrival date & time: 03/21/23  1003     History  Chief Complaint  Patient presents with   Dysuria    Joshua Simpson is a 52 y.o. male.  HPI 52 year old male presents with dysuria.  Has had about 1 week of symptoms.  He is also having increased urinary frequency and some on and off left-sided abdominal/low back pain.  However he has not had either of those symptoms for the last 2 days.  Was prescribed Cipro starting 3 days ago for a UTI though has not felt much relief.  He has been having some chills and sweats at night that whenever he checked his temperature it is normal.  No vomiting but he has had some diarrhea.  No blood in the urine.  No discharge or concern for STI.  Few years ago he had similar dysuria and was diagnosed with prostatitis but can't remember if it feels exactly the same.  Home Medications Prior to Admission medications   Medication Sig Start Date End Date Taking? Authorizing Provider  cefdinir (OMNICEF) 300 MG capsule Take 1 capsule (300 mg total) by mouth 2 (two) times daily for 14 days. 03/21/23 04/04/23 Yes Pricilla Loveless, MD  ALPRAZolam Prudy Feeler) 0.5 MG tablet Take 0.5 mg by mouth 3 (three) times daily as needed for anxiety. 07/06/21   [provider]  cetirizine (ZYRTEC) 10 MG tablet Take 10 mg by mouth daily.    [provider]  dabigatran (PRADAXA) 150 MG CAPS capsule Take 1 capsule (150 mg total) by mouth 2 (two) times daily. 03/08/23   Graciella Freer, PA-C  lisinopril (ZESTRIL) 10 MG tablet Take 1 tablet (10 mg total) by mouth daily. 08/23/21 12/03/22  Lennette Bihari, MD  metoprolol succinate (TOPROL-XL) 100 MG 24 hr tablet Take 1 tablet (100 mg total) by mouth 2 (two) times daily. Take with or immediately following a meal. 03/04/23   Tillery, Mariam Dollar, PA-C  nitroGLYCERIN (NITROSTAT) 0.4 MG SL tablet Place 1 tablet (0.4 mg total) under the  tongue every 5 (five) minutes as needed for chest pain. 08/10/21 12/03/22  Camnitz, Andree Coss, MD  pantoprazole (PROTONIX) 40 MG tablet Take 40 mg by mouth daily. 08/28/21   [provider]  rosuvastatin (CRESTOR) 40 MG tablet Take 1 tablet (40 mg total) by mouth daily. 05/30/22 05/25/23  Lennette Bihari, MD      Allergies    Trolamine salicylate    Review of Systems   Review of Systems  Constitutional:  Positive for chills and diaphoresis. Negative for fever.  Gastrointestinal:  Negative for vomiting.  Genitourinary:  Positive for dysuria. Negative for penile discharge.  Neurological:  Positive for weakness (generally).    Physical Exam Updated Vital Signs BP 103/71 (BP Location: Left Arm)   Pulse 80   Temp 98.3 F (36.8 C) (Oral)   Resp 16   Wt 129.3 kg   SpO2 98%   BMI 38.65 kg/m  Physical Exam Vitals and nursing note reviewed. Exam conducted with a chaperone present.  Constitutional:      General: He is not in acute distress.    Appearance: He is well-developed. He is obese. He is not ill-appearing or diaphoretic.  HENT:     Head: Normocephalic and atraumatic.  Cardiovascular:     Rate and Rhythm: Normal rate and regular rhythm.     Heart sounds: Normal heart sounds.  Pulmonary:  Effort: Pulmonary effort is normal.     Breath sounds: Normal breath sounds.  Abdominal:     Palpations: Abdomen is soft.     Tenderness: There is no abdominal tenderness. There is no right CVA tenderness or left CVA tenderness.  Genitourinary:    Rectum: No tenderness or external hemorrhoid.     Comments: Difficult to feel the prostate but no overt tenderness Skin:    General: Skin is warm and dry.  Neurological:     Mental Status: He is alert.     ED Results / Procedures / Treatments   Labs (all labs ordered are listed, but only abnormal results are displayed) Labs Reviewed  URINALYSIS, W/ REFLEX TO CULTURE (INFECTION SUSPECTED) - Abnormal; Notable for the following  components:      Result Value   Color, Urine STRAW (*)    Hgb urine dipstick SMALL (*)    Leukocytes,Ua TRACE (*)    Bacteria, UA RARE (*)    All other components within normal limits  CBC WITH DIFFERENTIAL/PLATELET - Abnormal; Notable for the following components:   WBC 10.7 (*)    Neutro Abs 8.0 (*)    All other components within normal limits  COMPREHENSIVE METABOLIC PANEL - Abnormal; Notable for the following components:   Glucose, Bld 122 (*)    Total Bilirubin 2.1 (*)    All other components within normal limits  URINE CULTURE    EKG None  Radiology CT Renal Stone Study  Result Date: 03/21/2023 CLINICAL DATA:  Abdominal pain and flank pain. Stone suspected. RIGHT lower quadrant pain. EXAM: CT ABDOMEN AND PELVIS WITHOUT CONTRAST TECHNIQUE: Multidetector CT imaging of the abdomen and pelvis was performed following the standard protocol without IV contrast. RADIATION DOSE REDUCTION: This exam was performed according to the departmental dose-optimization program which includes automated exposure control, adjustment of the mA and/or kV according to patient size and/or use of iterative reconstruction technique. COMPARISON:  None Available. FINDINGS: Lower chest: Lung bases are clear. Hepatobiliary: No focal hepatic lesion. Postcholecystectomy. No biliary dilatation. Pancreas: Pancreas is normal. No ductal dilatation. No pancreatic inflammation. Spleen: Normal spleen Adrenals/urinary tract: Adrenal glands and kidneys are normal. No nephrolithiasis or ureterolithiasis the ureters and bladder normal. Stomach/Bowel: Stomach, small bowel, appendix, and cecum are normal. The colon and rectosigmoid colon are normal. Vascular/Lymphatic: Abdominal aorta is normal caliber. No periportal or retroperitoneal adenopathy. No pelvic adenopathy. Reproductive: Prostate unremarkable Other: No free fluid. Musculoskeletal: No aggressive osseous lesion. IMPRESSION: 1. No nephrolithiasis, ureterolithiasis, or  hydronephrosis. 2. Normal appendix. 3. Postcholecystectomy. Electronically Signed   By: Genevive Bi M.D.   On: 03/21/2023 13:52    Procedures Procedures    Medications Ordered in ED Medications  sodium chloride 0.9 % bolus 1,000 mL ( Intravenous Stopped 03/21/23 1154)    ED Course/ Medical Decision Making/ A&P                                 Medical Decision Making Amount and/or Complexity of Data Reviewed Labs: ordered.    Details: Urine with leukocytes consistent with UTI.  Small amount of blood.  Mild leukocytosis. Radiology: ordered and independent interpretation performed.    Details: No ureteral stone  Risk Prescription drug management.   Patient had a similar episode several years ago that was diagnosed as prostatitis.  Was prostate is not particularly tender today, I am concerned as he otherwise would not have any significant reason to  have a UTI.  Last time he was given Omnicef and it worked well.  Would normally consider Bactrim but within began lisinopril think this would be better to avoid for now.  Otherwise, his labs are reassuring besides a slight leukocytosis.  He is otherwise feeling well.  Does not appear septic or ill.  Vitals are normal.  Will do 2 weeks of Omnicef and have him follow-up with PCP.  Given return precautions.        Final Clinical Impression(s) / ED Diagnoses Final diagnoses:  Acute urinary tract infection    Rx / DC Orders ED Discharge Orders          Ordered    cefdinir (OMNICEF) 300 MG capsule  2 times daily        03/21/23 1410              Pricilla Loveless, MD 03/21/23 1420

## 2023-03-21 NOTE — Discharge Instructions (Signed)
STOP taking the Cipro.  We are prescribing you a new antibiotic which will have to be for 14 days given your history of prostatitis.  Follow-up closely with your primary care physician.  If you develop new or worsening or not improving symptoms, fever, vomiting, or any other new/concerning symptoms then return to the ER or call 911.

## 2023-03-22 LAB — URINE CULTURE: Culture: NO GROWTH

## 2023-04-02 ENCOUNTER — Encounter: Payer: Self-pay | Admitting: Cardiology

## 2023-04-02 ENCOUNTER — Ambulatory Visit: Payer: BC Managed Care – PPO | Attending: Cardiology | Admitting: Cardiology

## 2023-04-02 VITALS — BP 146/86 | HR 74 | Ht 72.0 in | Wt 299.2 lb

## 2023-04-02 DIAGNOSIS — I483 Typical atrial flutter: Secondary | ICD-10-CM | POA: Diagnosis not present

## 2023-04-02 DIAGNOSIS — I480 Paroxysmal atrial fibrillation: Secondary | ICD-10-CM

## 2023-04-02 MED ORDER — PROPAFENONE HCL ER 325 MG PO CP12: 325.0000 mg | ORAL_CAPSULE | Freq: Two times a day (BID) | ORAL | 6 refills | Status: DC

## 2023-04-02 NOTE — Progress Notes (Signed)
Electrophysiology Office Note:   Date:  04/02/2023  ID:  Joshua A [place], DOB [birth year], MRN [id number]  Primary Cardiologist: Nicki Guadalajara, MD Electrophysiologist: Yareth Kearse Jorja Loa, MD      History of Present Illness:   Joshua Simpson is a 52 y.o. male with h/o hypertension, sleep apnea, atrial fibrillation/flutter seen today for routine electrophysiology followup.   Since last being seen in our clinic the patient reports doing okay.  He has had recurrent episodes of atrial fibrillation, atrial flutter, and what he thinks is PACs.  He feels palpitations, fatigue, shortness of breath when he is in his arrhythmia.  He has been avoiding alcohol.  Despite this, he continues to have his arrhythmia.  he denies chest pain, palpitations, dyspnea, PND, orthopnea, nausea, vomiting, dizziness, syncope, edema, weight gain, or early satiety.   Review of systems complete and found to be negative unless listed in HPI.   EP Information / Studies Reviewed:    EKG is not ordered today. EKG from 03/04/23 reviewed which showed sinus rhythm        Risk Assessment/Calculations:    CHA2DS2-VASc Score = 1   This indicates a 0.6% annual risk of stroke. The patient's score is based upon: CHF History: 0 HTN History: 1 Diabetes History: 0 Stroke History: 0 Vascular Disease History: 0 Age Score: 0 Gender Score: 0      Physical Exam:   VS:  BP (!) 146/86 (BP Location: Right Arm, Patient Position: Sitting, Cuff Size: Large)   Pulse 74   Ht 6' (1.829 m)   Wt 299 lb 3.2 oz (135.7 kg)   SpO2 95%   BMI 40.58 kg/m    Wt Readings from Last 3 Encounters:  04/02/23 299 lb 3.2 oz (135.7 kg)  03/21/23 285 lb (129.3 kg)  03/04/23 298 lb 9.6 oz (135.4 kg)     GEN: Well nourished, well developed in no acute distress NECK: No JVD; No carotid bruits CARDIAC: Regular rate and rhythm, no murmurs, rubs, gallops RESPIRATORY:  Clear to auscultation without rales, wheezing or rhonchi  ABDOMEN: Soft, non-tender,  non-distended EXTREMITIES:  No edema; No deformity   ASSESSMENT AND PLAN:    1.  Paroxysmal atrial fibrillation/flutter: Is unfortunately continued to have arrhythmias.  He feels poorly when he is in his arrhythmia.  He would prefer a rhythm control strategy.  Julya Alioto start propafenone 325 mg twice daily.  Abbigale Mcelhaney plan for an EKG in 2 weeks.  Trystan Akhtar also plan for repeat ablation.  The patient understands that risks include but are not limited to stroke (1 in 1000), death (1 in 1000), kidney failure [usually temporary] (1 in 500), bleeding (1 in 200), allergic reaction [possibly serious] (1 in 200), and agrees to proceed.  2.  Obstructive apnea: CPAP compliance encouraged  3.  Obesity: Lifestyle modification encouraged  Follow up with Dr. Elberta Fortis as usual post procedure  Signed, Suheyb Raucci Jorja Loa, MD

## 2023-04-02 NOTE — Patient Instructions (Addendum)
Medication Instructions:  Your physician has recommended you make the following change in your medication: START Propafenone 325 mg twice daily   *If you need a refill on your cardiac medications before your next appointment, please call your pharmacy*   Lab Work: Pre procedure labs -- see procedure instruction letter:  BMP & CBC  If you have any lab test that is abnormal and we need to change your treatment, we will call you to review the results -- otherwise no news is good news.    Testing/Procedures: Your physician has requested that you have cardiac CT within 7-10 days PRIOR to your ablation. Cardiac computed tomography (CT) is a painless test that uses an x-ray machine to take clear, detailed pictures of your heart.  Please follow instruction below located under "other instructions". You will get a call from our office to schedule the date for this test.  Your physician has recommended that you have a repeat ablation. Catheter ablation is a medical procedure used to treat some cardiac arrhythmias (irregular heartbeats). During catheter ablation, a long, thin, flexible tube is put into a blood vessel in your groin (upper thigh), or neck. This tube is called an ablation catheter. It is then guided to your heart through the blood vessel. Radio frequency waves destroy small areas of heart tissue where abnormal heartbeats may cause an arrhythmia to start.   You will be scheduled for 08/15/2023.  The EP scheduler, April, will be in touch with CT & procedure instructions.   Follow-Up: At [facility], you and your health needs are our priority.  As part of our continuing mission to provide you with exceptional heart care, we have created designated Provider Care Teams.  These Care Teams include your primary Cardiologist (physician) and Advanced Practice Providers (APPs -  Physician Assistants and Nurse Practitioners) who all work together to provide you with the care you need, when you need  it.  We recommend signing up for the patient portal called "MyChart".  Sign up information is provided on this After Visit Summary.  MyChart is used to connect with patients for Virtual Visits (Telemedicine).  Patients are able to view lab/test results, encounter notes, upcoming appointments, etc.  Non-urgent messages can be sent to your provider as well.   To learn more about what you can do with MyChart, go to [email or web address].     Your physician recommends that you schedule a follow-up appointment in: 2 weeks for nurse visit EKG   Your next appointment:   1 month(s) after your ablation  The format for your next appointment:   In Person  Provider:   AFib clinic   Thank you for choosing [facility]!!   Dory Horn, RN [phone]    Other Instructions   Cardiac Ablation Cardiac ablation is a procedure to destroy (ablate) some heart tissue that is sending bad signals. These bad signals cause problems in heart rhythm. The heart has many areas that make these signals. If there are problems in these areas, they can make the heart beat in a way that is not normal. Destroying some tissues can help make the heart rhythm normal. Tell your doctor about: Any allergies you have. All medicines you are taking. These include vitamins, herbs, eye drops, creams, and over-the-counter medicines. Any problems you or family members have had with medicines that make you fall asleep (anesthetics). Any blood disorders you have. Any surgeries you have had. Any medical conditions you have, such as kidney failure. Whether you are  pregnant or may be pregnant. What are the risks? This is a safe procedure. But problems may occur, including: Infection. Bruising and bleeding. Bleeding into the chest. Stroke or blood clots. Damage to nearby areas of your body. Allergies to medicines or dyes. The need for a pacemaker if the normal system is damaged. Failure of the procedure to treat  the problem. What happens before the procedure? Medicines Ask your doctor about: Changing or stopping your normal medicines. This is important. Taking aspirin and ibuprofen. Do not take these medicines unless your doctor tells you to take them. Taking other medicines, vitamins, herbs, and supplements. General instructions Follow instructions from your doctor about what you cannot eat or drink. Plan to have someone take you home from the hospital or clinic. If you will be going home right after the procedure, plan to have someone with you for 24 hours. Ask your doctor what steps will be taken to prevent infection. What happens during the procedure?  An IV tube will be put into one of your veins. You will be given a medicine to help you relax. The skin on your neck or groin will be numbed. A cut (incision) will be made in your neck or groin. A needle will be put through your cut and into a large vein. A tube (catheter) will be put into the needle. The tube will be moved to your heart. Dye may be put through the tube. This helps your doctor see your heart. Small devices (electrodes) on the tube will send out signals. A type of energy will be used to destroy some heart tissue. The tube will be taken out. Pressure will be held on your cut. This helps stop bleeding. A bandage will be put over your cut. The exact procedure may vary among doctors and hospitals. What happens after the procedure? You will be watched until you leave the hospital or clinic. This includes checking your heart rate, breathing rate, oxygen, and blood pressure. Your cut will be watched for bleeding. You will need to lie still for a few hours. Do not drive for 24 hours or as long as your doctor tells you. Summary Cardiac ablation is a procedure to destroy some heart tissue. This is done to treat heart rhythm problems. Tell your doctor about any medical conditions you may have. Tell him or her about all medicines you  are taking to treat them. This is a safe procedure. But problems may occur. These include infection, bruising, bleeding, and damage to nearby areas of your body. Follow what your doctor tells you about food and drink. You may also be told to change or stop some of your medicines. After the procedure, do not drive for 24 hours or as long as your doctor tells you. This information is not intended to replace advice given to you by your health care provider. Make sure you discuss any questions you have with your health care provider. Document Revised: 09/22/2021 Document Reviewed: 06/04/2019 Elsevier Patient Education  2023 Elsevier Inc.   Cardiac Ablation, Care After  This sheet gives you information about how to care for yourself after your procedure. Your health care provider may also give you more specific instructions. If you have problems or questions, contact your health care provider. What can I expect after the procedure? After the procedure, it is common to have: Bruising around your puncture site. Tenderness around your puncture site. Skipped heartbeats. If you had an atrial fibrillation ablation, you may have atrial fibrillation during the  first several months after your procedure.  Tiredness (fatigue).  Follow these instructions at home: Puncture site care  Follow instructions from your health care provider about how to take care of your puncture site. Make sure you: If present, leave stitches (sutures), skin glue, or adhesive strips in place. These skin closures may need to stay in place for up to 2 weeks. If adhesive strip edges start to loosen and curl up, you may trim the loose edges. Do not remove adhesive strips completely unless your health care provider tells you to do that. If a large square bandage is present, this may be removed 24 hours after surgery.  Check your puncture site every day for signs of infection. Check for: Redness, swelling, or pain. Fluid or blood. If  your puncture site starts to bleed, lie down on your back, apply firm pressure to the area, and contact your health care provider. Warmth. Pus or a bad smell. A pea or small marble sized lump at the site is normal and can take up to three months to resolve.  Driving Do not drive for at least 4 days after your procedure or however long your health care provider recommends. (Do not resume driving if you have previously been instructed not to drive for other health reasons.) Do not drive or use heavy machinery while taking prescription pain medicine. Activity Avoid activities that take a lot of effort for at least 7 days after your procedure. Do not lift anything that is heavier than 5 lb (4.5 kg) for one week.  No sexual activity for 1 week.  Return to your normal activities as told by your health care provider. Ask your health care provider what activities are safe for you. General instructions Take over-the-counter and prescription medicines only as told by your health care provider. Do not use any products that contain nicotine or tobacco, such as cigarettes and e-cigarettes. If you need help quitting, ask your health care provider. You may shower after 24 hours, but Do not take baths, swim, or use a hot tub for 1 week.  Do not drink alcohol for 24 hours after your procedure. Keep all follow-up visits as told by your health care provider. This is important. Contact a health care provider if: You have redness, mild swelling, or pain around your puncture site. You have fluid or blood coming from your puncture site that stops after applying firm pressure to the area. Your puncture site feels warm to the touch. You have pus or a bad smell coming from your puncture site. You have a fever. You have chest pain or discomfort that spreads to your neck, jaw, or arm. You have chest pain that is worse with lying on your back or taking a deep breath. You are sweating a lot. You feel nauseous. You  have a fast or irregular heartbeat. You have shortness of breath. You are dizzy or light-headed and feel the need to lie down. You have pain or numbness in the arm or leg closest to your puncture site. Get help right away if: Your puncture site suddenly swells. Your puncture site is bleeding and the bleeding does not stop after applying firm pressure to the area. These symptoms may represent a serious problem that is an emergency. Do not wait to see if the symptoms will go away. Get medical help right away. Call your local emergency services (911 in the [place] Do not drive yourself to the hospital. Summary After the procedure, it is normal to have  bruising and tenderness at the puncture site in your groin, neck, or forearm. Check your puncture site every day for signs of infection. Get help right away if your puncture site is bleeding and the bleeding does not stop after applying firm pressure to the area. This is a medical emergency. This information is not intended to replace advice given to you by your health care provider. Make sure you discuss any questions you have with your health care provider.

## 2023-04-15 ENCOUNTER — Telehealth: Payer: Self-pay | Admitting: *Deleted

## 2023-04-15 NOTE — Telephone Encounter (Signed)
Spoke to pt earlier this morning about scheduling RN visit EKG. Pt reports he has not started the Propafenone yet.  States he has been feeling well and doesn't want to start it unless his afib starts giving him issues again.  Dr. Elberta Fortis aware and ok w/ plan for now to hold off on starting Propafenone.  Pt will let us know if/when he does need to start it prior to ablation.  He understands he will need a nurse visit 2 weeks after starting it and will let me know if he does so that we may arrange EKG visit. Patient verbalized understanding and agreeable to plan.

## 2023-05-06 ENCOUNTER — Telehealth: Payer: Self-pay | Admitting: Cardiovascular Disease

## 2023-05-06 MED ORDER — METOPROLOL SUCCINATE ER 100 MG PO TB24: 100.0000 mg | ORAL_TABLET | Freq: Two times a day (BID) | ORAL | 3 refills | Status: DC

## 2023-05-06 NOTE — Telephone Encounter (Signed)
Pt's medication was sent to pt's pharmacy as requested. Confirmation received.  °

## 2023-05-06 NOTE — Telephone Encounter (Signed)
*  STAT* If patient is at the pharmacy, call can be transferred to refill team.   1. Which medications need to be refilled? (please list name of each medication and dose if known)   metoprolol succinate (TOPROL-XL) 100 MG 24 hr tablet   2. Would you like to learn more about the convenience, safety, & potential cost savings by using the [facility]?   3. Are you open to using the [facility] (Type [facility]. ).  4. Which pharmacy/location (including street and city if local pharmacy) is medication to be sent to?  [facility] - [place], [place] - [address] [place] [address]   5. Do they need a 30 day or 90 day supply?   90 day  Patient stated he only has two days left of this medication.  Patient has appointment scheduled on 11/8.

## 2023-05-24 ENCOUNTER — Ambulatory Visit: Payer: BC Managed Care – PPO | Attending: Physician Assistant | Admitting: Physician Assistant

## 2023-05-24 VITALS — BP 108/76 | HR 71 | Ht 72.0 in | Wt 297.0 lb

## 2023-05-24 DIAGNOSIS — E785 Hyperlipidemia, unspecified: Secondary | ICD-10-CM

## 2023-05-24 DIAGNOSIS — K8010 Calculus of gallbladder with chronic cholecystitis without obstruction: Secondary | ICD-10-CM

## 2023-05-24 DIAGNOSIS — I480 Paroxysmal atrial fibrillation: Secondary | ICD-10-CM

## 2023-05-24 DIAGNOSIS — I10 Essential (primary) hypertension: Secondary | ICD-10-CM

## 2023-05-24 NOTE — Progress Notes (Unsigned)
Cardiology Office Note:  .   Date:  05/24/2023  ID:  Joshua Simpson, DOB 1970/08/09, MRN [id number] PCP: Erskine Emery, NP  [facility] Providers Cardiologist:  Nicki Guadalajara, MD Electrophysiologist:  Will Jorja Loa, MD { Click to update primary MD,subspecialty MD or APP then REFRESH:1}   History of Present Illness: .   Joshua Simpson is a 52 y.o. male with PMH of hypertension, morbid obesity, PAF s/p ablation by Dr. Elberta Fortis 09/26/2021 and OSA on CPAP.  Echocardiogram in March 2017 showed EF 60 to 65%, no regional wall motion abnormality, grade 2 DD, trivial MR, dilated aortic root at 37 mm.  Exercise tolerance test in June 2018 was negative for ischemia.  He underwent cardiac catheterization on 05/26/2018 that showed EF 52%, LVEDP elevated at 22 mmHg, normal coronary arteries.  He had a multiple heart monitors in February 2019, January 2023 and again in February 2024.  He was evaluated by A-fib clinic in November 2022 after he developed atrial fibrillation.  Echocardiogram in December 2022 showed EF 60 to 65%, grade 2 DD.  Previous heart monitor in January 2023 showed 21% atrial fibrillation/atrial flutter burden.  Sleep study in January 2023 showed severe obstructive sleep apnea with AHI 38.7/h, severe oxygen desaturation of 80%.  He was evaluated by Dr., He was started on Eliquis and amiodarone.  He eventually underwent successful A-fib ablation by Dr. Elberta Fortis on 09/26/2021 for paroxysmal atrial fibrillation and atypical appearing atrial flutter.  He was last seen by Dr. Tresa Endo on 12//2023 at which time he was doing well.  Since the last visit, patient had several breakthrough atrial fibrillation in August 2024, he was restarted on Eliquis.  Metoprolol increased for rate control.  He was recently seen by Dr. Elberta Fortis in September 2024 who recommended repeat ablation.  He was also started on propafenone 325 mg twice a day.  Based on phone note, since the last visit with Dr. Elberta Fortis, his heart  A-fib has settled down, therefore he never started on the propafenone.  He presents today for follow-up, he has not had any recurrence of A-fib since the last visit.  Blood pressure and heart rate are very well-controlled.  He has been compliant with his CPAP therapy every night.  Lipid panel is being followed by PCP.  Overall, he has been doing well from the cardiac perspective.  He has not had any chest pain or shortness of breath.  He has no lower extremity edema, orthopnea or PND.  ROS: ***  Studies Reviewed: .        *** Risk Assessment/Calculations:   {Does this patient have ATRIAL FIBRILLATION?[phone]} No BP recorded.  {Refresh Note OR Click here to enter BP  :1}***       Physical Exam:   VS:  Ht 6' (1.829 m)   Wt 297 lb (134.7 kg)   BMI 40.28 kg/m    Wt Readings from Last 3 Encounters:  05/24/23 297 lb (134.7 kg)  04/02/23 299 lb 3.2 oz (135.7 kg)  03/21/23 285 lb (129.3 kg)    GEN: Well nourished, well developed in no acute distress NECK: No JVD; No carotid bruits CARDIAC: ***RRR, no murmurs, rubs, gallops RESPIRATORY:  Clear to auscultation without rales, wheezing or rhonchi  ABDOMEN: Soft, non-tender, non-distended EXTREMITIES:  No edema; No deformity   ASSESSMENT AND PLAN: .   ***    {Are you ordering a CV Procedure (e.g. stress test, cath, DCCV, TEE, etc)?   Press F2        :  [id number]}  Dispo: ***  Signed, Azalee Course, PA

## 2023-05-24 NOTE — Patient Instructions (Signed)
Medication Instructions:  NO CHANGES *If you need a refill on your cardiac medications before your next appointment, please call your pharmacy*   Lab Work: NO LABS If you have labs (blood work) drawn today and your tests are completely normal, you will receive your results only by: MyChart Message (if you have MyChart) OR A paper copy in the mail If you have any lab test that is abnormal or we need to change your treatment, we will call you to review the results.   Testing/Procedures: NO TESTING   Follow-Up: At [facility], you and your health needs are our priority.  As part of our continuing mission to provide you with exceptional heart care, we have created designated Provider Care Teams.  These Care Teams include your primary Cardiologist (physician) and Advanced Practice Providers (APPs -  Physician Assistants and Nurse Practitioners) who all work together to provide you with the care you need, when you need it.   Your next appointment:   1 year(s)  Provider:   Nicki Guadalajara, MD

## 2023-06-01 ENCOUNTER — Other Ambulatory Visit: Payer: Self-pay | Admitting: Cardiovascular Disease

## 2023-06-14 ENCOUNTER — Telehealth: Payer: Self-pay | Admitting: Cardiology

## 2023-06-14 DIAGNOSIS — I480 Paroxysmal atrial fibrillation: Secondary | ICD-10-CM

## 2023-06-14 NOTE — Telephone Encounter (Signed)
   Patient called after hours line today to inquire about possibly switching back to Eliquis from Pradaxa. He was switched to Pradaxa due to cost issues on Eliquis but has now met his deductible (which per patient doesn't reset until July 2025). He reports mild difficulty swallowing the Pradaxa capsules. Given that this is a non-urgent medical question, will forward to patient's primary cardiology care team for review. Set expectations that he would not likely receive a call back until next week.  Perlie Gold, PA-C

## 2023-06-17 ENCOUNTER — Telehealth: Payer: Self-pay | Admitting: Cardiology

## 2023-06-17 MED ORDER — APIXABAN 5 MG PO TABS
5.0000 mg | ORAL_TABLET | Freq: Two times a day (BID) | ORAL | 0 refills | Status: DC
Start: 2023-06-17 — End: 2023-11-11

## 2023-06-17 NOTE — Telephone Encounter (Signed)
Prescription refill request for Eliquis received. Indication: Afib  Last office visit: 05/24/23 Lisabeth Devoid)  Scr: 1.01 (03/21/23)  Age: 52 Weight: 134.7kg   Eliquis 5mg  BID is appropriate dose. Refill sent. Called pt and made him aware.

## 2023-06-17 NOTE — Telephone Encounter (Signed)
Just sent a note to anticoag about this. Ok to switch back to Eliquis per Dr. Elberta Fortis. Forwarding to anticoag team to address and send in Rx

## 2023-06-17 NOTE — Addendum Note (Signed)
Addended by: Betsy Coder B on: 06/17/2023 11:57 AM   Modules accepted: Orders

## 2023-06-17 NOTE — Telephone Encounter (Signed)
Per Dr. Elberta Fortis ok for pt to return to Eliquis if prefers. Forwarding to anticoag team to address and send in Rx.

## 2023-06-17 NOTE — Telephone Encounter (Signed)
Prescription refill request for Eliquis received. Indication: Afib  Last office visit: 05/24/23 Joshua Simpson)  Scr: 1.01 (03/21/23)  Age: 52 Weight: 134.7kg  Eliquis 5mg  BID is appropriate dose. Reill sent. Called pt and made him aware.

## 2023-06-17 NOTE — Telephone Encounter (Signed)
Pt c/o medication issue:  1. Name of Medication:  dabigatran (PRADAXA) 150 MG CAPS capsule   2. How are you currently taking this medication (dosage and times per day)? As prescribed  3. Are you having a reaction (difficulty breathing--STAT)? Yes  4. What is your medication issue? Patient states he believes the medication is causing him heart burn. He is requesting to switch over to eliquis due to meeting his deductible so he can now afford it. Please advise.

## 2023-06-29 ENCOUNTER — Other Ambulatory Visit: Payer: Self-pay | Admitting: Student

## 2023-07-01 ENCOUNTER — Other Ambulatory Visit: Payer: Self-pay | Admitting: Specialist

## 2023-07-01 DIAGNOSIS — R1310 Dysphagia, unspecified: Secondary | ICD-10-CM

## 2023-07-02 ENCOUNTER — Ambulatory Visit
Admission: RE | Admit: 2023-07-02 | Discharge: 2023-07-02 | Disposition: A | Discharge status: Home / Self Care | Payer: BC Managed Care – PPO | Source: Ambulatory Visit | Attending: Specialist | Admitting: Specialist

## 2023-07-02 DIAGNOSIS — R1310 Dysphagia, unspecified: Secondary | ICD-10-CM

## 2023-07-05 IMAGING — CT CT HEART MORPH/PULM VEIN W/ CM & W/O CA SCORE
2 of 7 series · 11 of 20 positions shown, 13 images · IV contrast (Omni 300)
Comparison: None.

Addendum:
CLINICAL DATA: Atrial fibrillation scheduled for an ablation.

EXAM:
Cardiac CT/CTA
TECHNIQUE: The patient was scanned on a Siemens Somatom scanner.

[Series 7: 0-90% · axial · 0.72mm/px · z∈[+1229,+1333]mm · 5 of 3100 slices shown]
[im 517/3100  vessel]
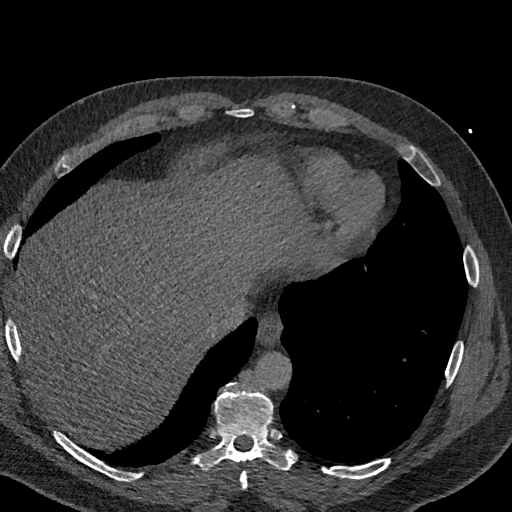
[im 1034/3100  vessel]
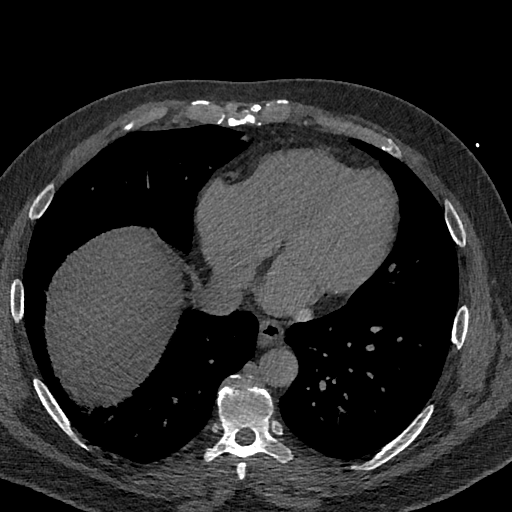
[im 1550/3100  vessel]
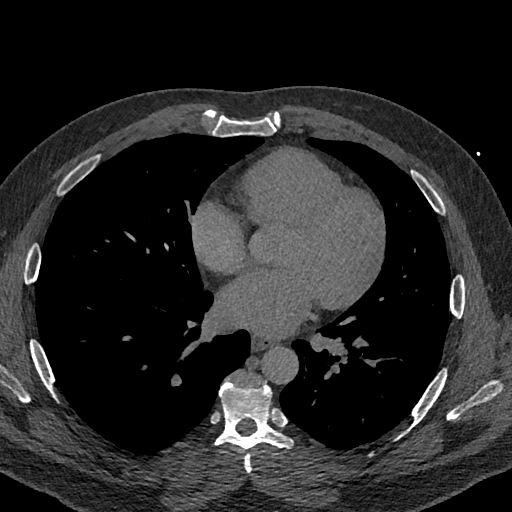
[im 2067/3100  vessel]
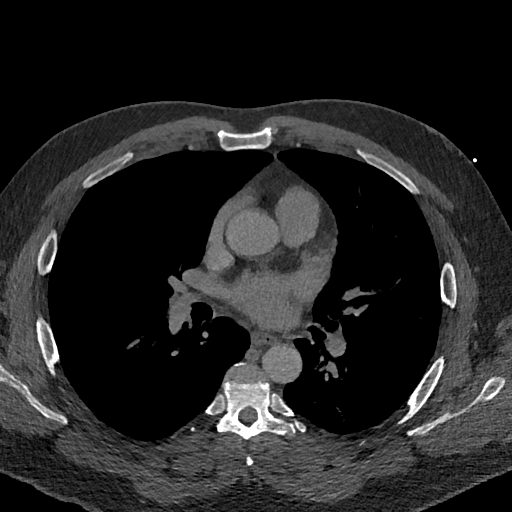
[im 2583/3100  vessel]
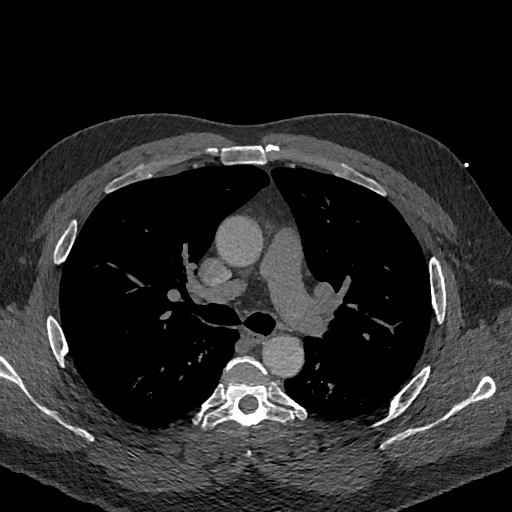

[Series 12: 5-95% · axial · 0.72mm/px · z∈[+1226,+1336]mm · 6 of 3100 slices shown, 8 images]
[im 443/3100  vessel]
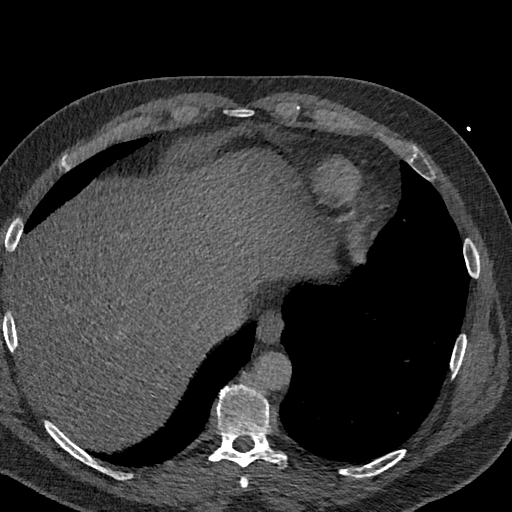
[im 443/3100  lung]
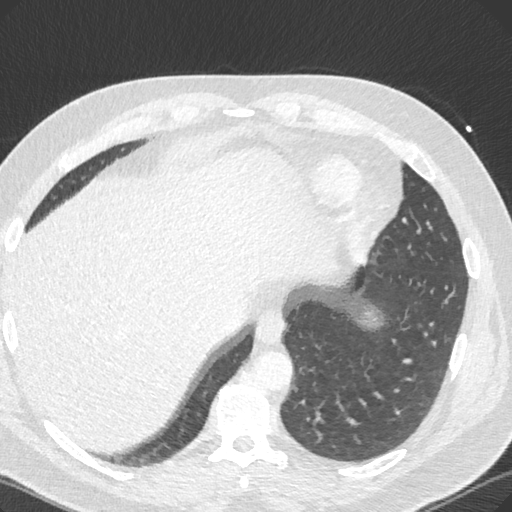
[im 886/3100  vessel]
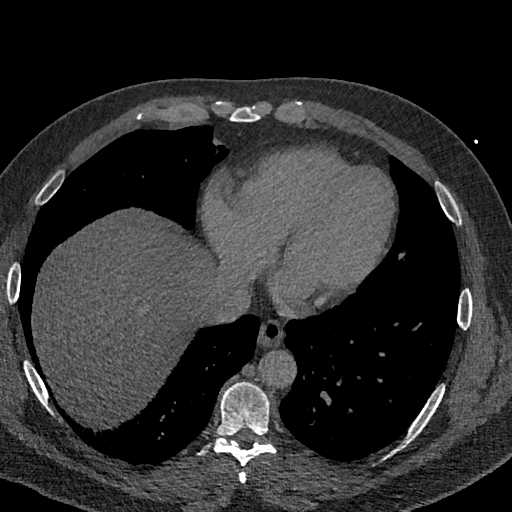
[im 1329/3100  vessel]
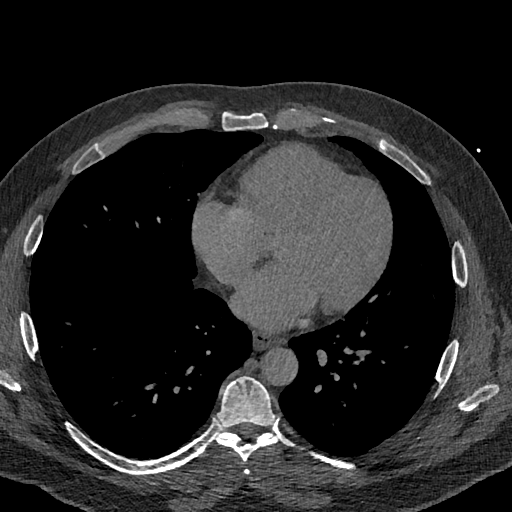
[im 1771/3100  vessel]
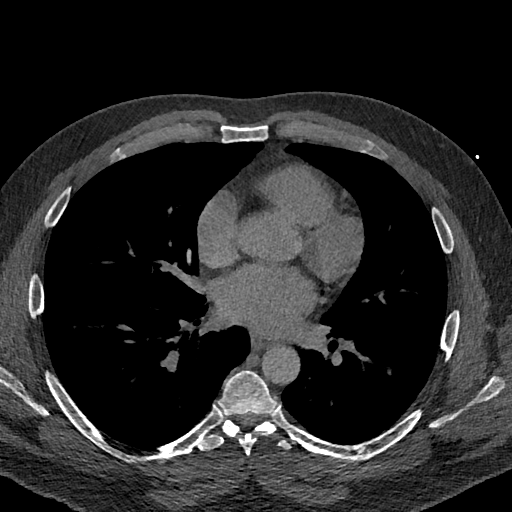
[im 2214/3100  vessel]
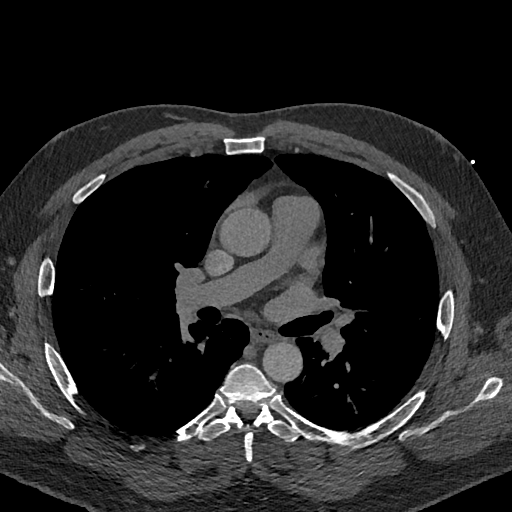
[im 2214/3100  lung]
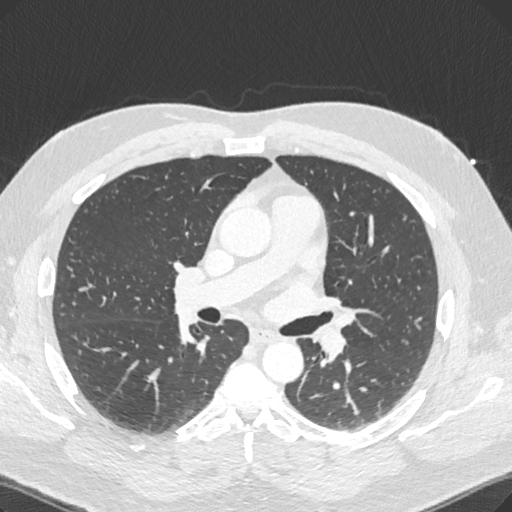
[im 2657/3100  vessel]
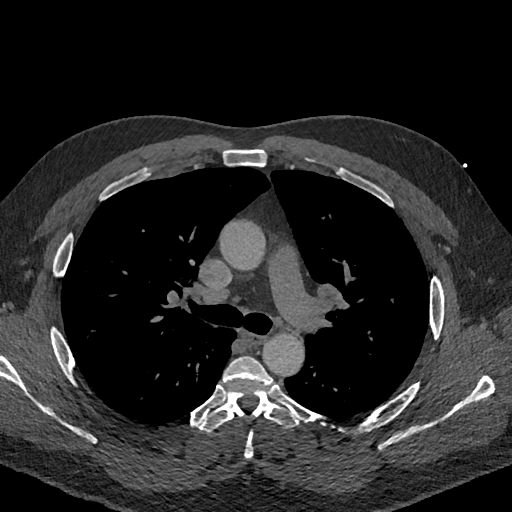

[11 of 20 positions shown; findings below may reference images not displayed]

FINDINGS: A 120 kV prospective scan was triggered in the descending thoracic
aorta at 111 HU's. Gantry rotation speed was 280 msecs and
collimation was .9 mm. No beta blockade and no NTG was given. The 3D
data set was reconstructed in 5% intervals of the 0-90% of the R-R
cycle. Diastolic phases were analyzed on a dedicated work station
using MPR, MIP and VRT modes. The patient received 80 cc of
contrast.

There is normal pulmonary vein drainage into the left atrium (2 on
the right and 2 on the left) with ostial measurements as follows:

LIPV: 19.9 x 17.6 mm, area 2.68 cm2

LSPV: 22.7 x 17.7 mm, area 3.11 cm2

RIPV: 23.1 x 21.9 mm, area 3.80 cm2

RSPV: 16.3 x 15.8 mm, area 1.71 cm2

The left atrial appendage is large chicken wing / broccoli type with
a single lobe and ostial size 32 x 25 mm and length 48 mm. There is
no thrombus in the left atrial appendage.

The esophagus runs in the left atrial midline and is not in the
proximity to any of the pulmonary veins.

Aorta:  Normal caliber.  No dissection or calcifications.

Aortic Valve:  Trileaflet.  No calcifications.

Coronary Arteries: Normal coronary origin. Right dominance. The
study was performed without use of NTG and insufficient for plaque
evaluation.

Calcium score: Coronary calcium score of 0. This was 0 percentile
for age-, race-, and sex-matched controls.
IMPRESSION: 1. There is normal pulmonary vein drainage into the left atrium.

2. The left atrial appendage is large - mixed chicken wing/broccoli
type with two lobes and ostial size 32 x 25 mm and length 48 mm.
There is no thrombus in the left atrial appendage.

3. The esophagus runs in the left atrial midline and is not in the
proximity to any of the pulmonary veins.

4. Calcium score: Coronary calcium score of 0. This was 0 percentile
for age-, race-, and sex-matched controls.

EXAM:
OVER-READ INTERPRETATION  CT CHEST

The following report is an over-read performed by radiologist Dr.
does not include interpretation of cardiac or coronary anatomy or
pathology. The coronary calcium score/coronary CTA interpretation by
the cardiologist is attached.
FINDINGS: No suspicious nodules, masses, or infiltrates are identified in the
visualized portion of the lungs. No pleural fluid seen.

The visualized portions of the mediastinum and chest wall are
unremarkable.
IMPRESSION: No significant non-cardiac abnormality identified.

*** End of Addendum ***
FINDINGS: A 120 kV prospective scan was triggered in the descending thoracic
aorta at 111 HU's. Gantry rotation speed was 280 msecs and
collimation was .9 mm. No beta blockade and no NTG was given. The 3D
data set was reconstructed in 5% intervals of the 0-90% of the R-R
cycle. Diastolic phases were analyzed on a dedicated work station
using MPR, MIP and VRT modes. The patient received 80 cc of
contrast.

There is normal pulmonary vein drainage into the left atrium (2 on
the right and 2 on the left) with ostial measurements as follows:

LIPV: 19.9 x 17.6 mm, area 2.68 cm2

LSPV: 22.7 x 17.7 mm, area 3.11 cm2

RIPV: 23.1 x 21.9 mm, area 3.80 cm2

RSPV: 16.3 x 15.8 mm, area 1.71 cm2

The left atrial appendage is large chicken wing / broccoli type with
a single lobe and ostial size 32 x 25 mm and length 48 mm. There is
no thrombus in the left atrial appendage.

The esophagus runs in the left atrial midline and is not in the
proximity to any of the pulmonary veins.

Aorta:  Normal caliber.  No dissection or calcifications.

Aortic Valve:  Trileaflet.  No calcifications.

Coronary Arteries: Normal coronary origin. Right dominance. The
study was performed without use of NTG and insufficient for plaque
evaluation.

Calcium score: Coronary calcium score of 0. This was 0 percentile
for age-, race-, and sex-matched controls.
IMPRESSION: 1. There is normal pulmonary vein drainage into the left atrium.

2. The left atrial appendage is large - mixed chicken wing/broccoli
type with two lobes and ostial size 32 x 25 mm and length 48 mm.
There is no thrombus in the left atrial appendage.

3. The esophagus runs in the left atrial midline and is not in the
proximity to any of the pulmonary veins.

4. Calcium score: Coronary calcium score of 0. This was 0 percentile
for age-, race-, and sex-matched controls.

## 2023-07-12 ENCOUNTER — Other Ambulatory Visit: Payer: Self-pay

## 2023-07-12 DIAGNOSIS — I480 Paroxysmal atrial fibrillation: Secondary | ICD-10-CM

## 2023-07-27 LAB — CBC
Hematocrit: 47.3 % (ref 37.5–51.0)
Hemoglobin: 16.2 g/dL (ref 13.0–17.7)
MCH: 32.1 pg (ref 26.6–33.0)
MCHC: 34.2 g/dL (ref 31.5–35.7)
MCV: 94 fL (ref 79–97)
Platelets: 195 10*3/uL (ref 150–450)
RBC: 5.04 x10E6/uL (ref 4.14–5.80)
RDW: 12.4 % (ref 11.6–15.4)
WBC: 6.2 10*3/uL (ref 3.4–10.8)

## 2023-07-27 LAB — BASIC METABOLIC PANEL
BUN/Creatinine Ratio: 8 — ABNORMAL LOW (ref 9–20)
BUN: 9 mg/dL (ref 6–24)
CO2: 25 mmol/L (ref 20–29)
Calcium: 9 mg/dL (ref 8.7–10.2)
Chloride: 105 mmol/L (ref 96–106)
Creatinine, Ser: 1.16 mg/dL (ref 0.76–1.27)
Glucose: 115 mg/dL — ABNORMAL HIGH (ref 70–99)
Potassium: 4.4 mmol/L (ref 3.5–5.2)
Sodium: 143 mmol/L (ref 134–144)
eGFR: 76 mL/min/{1.73_m2} (ref >59)

## 2023-07-30 ENCOUNTER — Other Ambulatory Visit: Payer: Self-pay

## 2023-07-30 ENCOUNTER — Emergency Department (HOSPITAL_BASED_OUTPATIENT_CLINIC_OR_DEPARTMENT_OTHER)
Admission: EM | Admit: 2023-07-30 | Discharge: 2023-07-31 | Disposition: A | Discharge status: Home / Self Care | Payer: BC Managed Care – PPO | Attending: Emergency Medicine | Admitting: Emergency Medicine

## 2023-07-30 ENCOUNTER — Ambulatory Visit (HOSPITAL_BASED_OUTPATIENT_CLINIC_OR_DEPARTMENT_OTHER)
Admission: RE | Admit: 2023-07-30 | Discharge: 2023-07-30 | Disposition: A | Discharge status: Home / Self Care | Payer: BC Managed Care – PPO | Source: Ambulatory Visit | Attending: Cardiology | Admitting: Cardiology

## 2023-07-30 ENCOUNTER — Emergency Department (HOSPITAL_BASED_OUTPATIENT_CLINIC_OR_DEPARTMENT_OTHER): Payer: BC Managed Care – PPO

## 2023-07-30 ENCOUNTER — Encounter (HOSPITAL_BASED_OUTPATIENT_CLINIC_OR_DEPARTMENT_OTHER): Payer: Self-pay

## 2023-07-30 ENCOUNTER — Telehealth: Payer: Self-pay | Admitting: Home Health

## 2023-07-30 DIAGNOSIS — I480 Paroxysmal atrial fibrillation: Secondary | ICD-10-CM | POA: Insufficient documentation

## 2023-07-30 DIAGNOSIS — Z79899 Other long term (current) drug therapy: Secondary | ICD-10-CM | POA: Diagnosis not present

## 2023-07-30 DIAGNOSIS — Z20822 Contact with and (suspected) exposure to covid-19: Secondary | ICD-10-CM | POA: Diagnosis not present

## 2023-07-30 DIAGNOSIS — R059 Cough, unspecified: Secondary | ICD-10-CM | POA: Diagnosis present

## 2023-07-30 DIAGNOSIS — Z7901 Long term (current) use of anticoagulants: Secondary | ICD-10-CM | POA: Insufficient documentation

## 2023-07-30 DIAGNOSIS — I4891 Unspecified atrial fibrillation: Secondary | ICD-10-CM | POA: Diagnosis not present

## 2023-07-30 DIAGNOSIS — J101 Influenza due to other identified influenza virus with other respiratory manifestations: Secondary | ICD-10-CM | POA: Diagnosis not present

## 2023-07-30 DIAGNOSIS — I10 Essential (primary) hypertension: Secondary | ICD-10-CM | POA: Diagnosis not present

## 2023-07-30 LAB — RESP PANEL BY RT-PCR (RSV, FLU A&B, COVID)  RVPGX2
Influenza A by PCR: POSITIVE — AB
Influenza B by PCR: NEGATIVE
Resp Syncytial Virus by PCR: NEGATIVE
SARS Coronavirus 2 by RT PCR: NEGATIVE

## 2023-07-30 LAB — BASIC METABOLIC PANEL
Anion gap: 10 (ref 5–15)
BUN: 12 mg/dL (ref 6–20)
CO2: 22 mmol/L (ref 22–32)
Calcium: 9.3 mg/dL (ref 8.9–10.3)
Chloride: 104 mmol/L (ref 98–111)
Creatinine, Ser: 1.03 mg/dL (ref 0.61–1.24)
GFR, Estimated: >60 mL/min (ref >60)
Glucose, Bld: 129 mg/dL — ABNORMAL HIGH (ref 70–99)
Potassium: 3.7 mmol/L (ref 3.5–5.1)
Sodium: 136 mmol/L (ref 135–145)

## 2023-07-30 LAB — D-DIMER, QUANTITATIVE: D-Dimer, Quant: 0.37 ug{FEU}/mL (ref 0.00–0.50)

## 2023-07-30 LAB — CBC
HCT: 47.5 % (ref 39.0–52.0)
Hemoglobin: 16.8 g/dL (ref 13.0–17.0)
MCH: 31.5 pg (ref 26.0–34.0)
MCHC: 35.4 g/dL (ref 30.0–36.0)
MCV: 89.1 fL (ref 80.0–100.0)
Platelets: 171 10*3/uL (ref 150–400)
RBC: 5.33 MIL/uL (ref 4.22–5.81)
RDW: 12.4 % (ref 11.5–15.5)
WBC: 6.6 10*3/uL (ref 4.0–10.5)
nRBC: 0 % (ref 0.0–0.2)

## 2023-07-30 LAB — TROPONIN I (HIGH SENSITIVITY)
Troponin I (High Sensitivity): <2 ng/L (ref <18)
Troponin I (High Sensitivity): 2 ng/L (ref <18)

## 2023-07-30 MED ORDER — OSELTAMIVIR PHOSPHATE 75 MG PO CAPS: 75.0000 mg | ORAL_CAPSULE | Freq: Two times a day (BID) | ORAL | 0 refills | Status: DC

## 2023-07-30 MED ORDER — IOHEXOL 350 MG/ML SOLN
95.0000 mL | Freq: Once | INTRAVENOUS | Status: AC | PRN
  Administered 2023-07-30: 95 mL via INTRAVENOUS

## 2023-07-30 MED ORDER — ACETAMINOPHEN 500 MG PO TABS
1000.0000 mg | ORAL_TABLET | Freq: Once | ORAL | Status: AC
  Administered 2023-07-30: 1000 mg via ORAL
  Filled 2023-07-30: qty 2

## 2023-07-30 MED ORDER — SODIUM CHLORIDE 0.9 % IV BOLUS
500.0000 mL | Freq: Once | INTRAVENOUS | Status: AC
  Administered 2023-07-30: 500 mL via INTRAVENOUS

## 2023-07-30 MED ORDER — IOHEXOL 350 MG/ML SOLN: 95.0000 mL | Freq: Once | INTRAVENOUS | Status: DC | PRN

## 2023-07-30 NOTE — ED Provider Notes (Addendum)
 [place] [facility] Provider Note   CSN: [id number] Arrival date & time: 07/30/23  2030     History  Chief Complaint  Patient presents with   URI   Chest Pain    Joshua Simpson is a 53 y.o. male.  With history of paroxysmal A-fib on Eliquis , hypertension, anxiety presenting to the ED for evaluation of tachycardia, headaches, cough, fatigue, chest tightness.  Symptoms began early this morning.  He presented to [facility] imaging earlier today and is found to have an elevated heart rate.  He took his metoprolol  after this and his heart rate continued to be elevated.  He denies any chest pain.  No shortness of breath.  Mild nonproductive cough.  He denies subjective fevers or chills.  He did not receive his flu vaccine this year.  He reports compliance with his Eliquis .   URI Associated symptoms: headaches   Chest Pain Associated symptoms: headache        Home Medications Prior to Admission medications   Medication Sig Start Date End Date Taking? Authorizing Provider  oseltamivir  (TAMIFLU ) 75 MG capsule Take 1 capsule (75 mg total) by mouth every 12 (twelve) hours. 07/30/23  Yes Tatum Corl, Marsa HERO, PA-C  ALPRAZolam  (XANAX ) 0.5 MG tablet Take 0.5 mg by mouth 3 (three) times daily as needed for anxiety. 07/06/21   [provider]  apixaban  (ELIQUIS ) 5 MG TABS tablet Take 1 tablet (5 mg total) by mouth 2 (two) times daily. 06/17/23   Camnitz, Soyla Lunger, MD  cetirizine (ZYRTEC) 10 MG tablet Take 10 mg by mouth daily.    [provider]  lisinopril  (ZESTRIL ) 10 MG tablet Take 1 tablet (10 mg total) by mouth daily. 08/23/21 12/03/22  Burnard Debby LABOR, MD  metoprolol  succinate (TOPROL -XL) 100 MG 24 hr tablet Take 1 tablet (100 mg total) by mouth 2 (two) times daily. Take with or immediately following a meal. 05/06/23   Camnitz, Soyla Lunger, MD  nitroGLYCERIN  (NITROSTAT ) 0.4 MG SL tablet Place 1 tablet (0.4 mg total) under the tongue every 5  (five) minutes as needed for chest pain. 08/10/21 12/03/22  Camnitz, Soyla Lunger, MD  pantoprazole  (PROTONIX ) 40 MG tablet Take 40 mg by mouth daily. 08/28/21   [provider]  propafenone  (RYTHMOL  SR) 325 MG 12 hr capsule Take 1 capsule (325 mg total) by mouth 2 (two) times daily. Patient not taking: Reported on 05/24/2023 04/02/23   Inocencio Soyla Lunger, MD  rosuvastatin  (CRESTOR ) 40 MG tablet TAKE 1 TABLET BY MOUTH DAILY 06/04/23   Burnard Debby LABOR, MD      Allergies    Ciprofibrate and Trolamine salicylate    Review of Systems   Review of Systems  Respiratory:  Positive for chest tightness.   Cardiovascular:  Positive for chest pain.  Neurological:  Positive for headaches.  All other systems reviewed and are negative.   Physical Exam Updated Vital Signs BP 106/72   Pulse 94   Temp 100 F (37.8 C) (Oral)   Resp 16   Ht 6' (1.829 m)   Wt 129.3 kg   SpO2 93%   BMI 38.65 kg/m  Physical Exam  ED Results / Procedures / Treatments   Labs (all labs ordered are listed, but only abnormal results are displayed) Labs Reviewed  RESP PANEL BY RT-PCR (RSV, FLU A&B, COVID)  RVPGX2 - Abnormal; Notable for the following components:      Result Value   Influenza A by PCR POSITIVE (*)  All other components within normal limits  BASIC METABOLIC PANEL - Abnormal; Notable for the following components:   Glucose, Bld 129 (*)    All other components within normal limits  CBC  D-DIMER, QUANTITATIVE  TROPONIN I (HIGH SENSITIVITY)  TROPONIN I (HIGH SENSITIVITY)    EKG None  Radiology DG Chest 2 View Result Date: 07/30/2023 CLINICAL DATA:  CP EXAM: CHEST - 2 VIEW COMPARISON:  Chest x-ray 08/24/2021 FINDINGS: The heart and mediastinal contours are unchanged. Atherosclerotic plaque. Low lung volumes. No focal consolidation. No pulmonary edema. No pleural effusion. No pneumothorax. No acute osseous abnormality. IMPRESSION: 1. Low lung volumes with no active cardiopulmonary disease. 2.   Aortic Atherosclerosis (ICD10-I70.0). Electronically Signed   By: Morgane  Naveau M.D.   On: 07/30/2023 21:35   CT CARDIAC MORPH/PULM VEIN W/CM&W/O CA SCORE Result Date: 07/30/2023 CLINICAL DATA:  Atrial fibrillation scheduled for ablation. EXAM: Cardiac CTA TECHNIQUE: A non-contrast, gated CT scan was obtained with axial slices of 3 mm through the heart for calcium  scoring. Calcium  scoring was performed using the Agatston method. A 120 kV prospective, gated, contrast cardiac scan was obtained. Gantry rotation speed was 250 msecs and collimation was 0.6 mm. Nitroglycerin  was not given. A delayed scan was obtained to exclude left atrial appendage thrombus. The 3D dataset was reconstructed in 5% intervals of the 25-50% of the R-R cycle. Late systolic phases were analyzed on a dedicated workstation using MPR, MIP, and VRT modes. The patient received 80 cc of contrast. FINDINGS: Image quality: Excellent. Noise artifact is: Limited. Pulmonary Veins: There is normal pulmonary vein drainage into the left atrium (2 on the right and 2 on the left). Left Atrium: The left atrial size is dilated. Small PFO. The left atrial appendage is large chicken wing type without thrombus on contrast or delayed imaging. The esophagus runs in the left atrial midline and is not in proximity to any of the pulmonary vein ostia. Coronary Arteries: CAC score of 0. Normal coronary origin. Right dominance. The study was performed without use of NTG and is insufficient for plaque evaluation. Right Atrium: Right atrial size is dilated. Right Ventricle: The right ventricular cavity is within normal limits. Left Ventricle: The ventricular cavity size is within normal limits. Pericardium: Normal thickness without significant effusion or calcium  present. Pulmonary Artery: Normal caliber. Cardiac valves: The aortic valve is trileaflet without significant calcification. The mitral valve is a normal structure without significant calcification. Aorta:  Normal caliber with no significant disease. Extra-cardiac findings: See attached radiology report for non-cardiac structures. IMPRESSION: 1. There is normal pulmonary vein drainage into the left atrium. 2. There is no thrombus in the left atrial appendage. 3. The esophagus runs in the left atrial midline and is not in proximity to any of the pulmonary vein ostia. 4. Small PFO. 5. CAC score of 0. Darryle Decent, MD Electronically Signed   By: Darryle Decent M.D.   On: 07/30/2023 20:35    Procedures Procedures    Medications Ordered in ED Medications  sodium chloride  0.9 % bolus 500 mL (0 mLs Intravenous Stopped 07/30/23 2323)  acetaminophen  (TYLENOL ) tablet 1,000 mg (1,000 mg Oral Given 07/30/23 2217)    ED Course/ Medical Decision Making/ A&P                                 Medical Decision Making Amount and/or Complexity of Data Reviewed Labs: ordered. Radiology: ordered.  Risk OTC drugs. Prescription  drug management.  This patient presents to the ED for concern of headache, fatigue, chest tightness, tachycardia, this involves an extensive number of treatment options, and is a complaint that carries with it a high risk of complications and morbidity.  The differential diagnosis includes upper respiratory infection, pneumonia, PE  My initial workup includes labs, imaging, fluids  Additional history obtained from: Nursing notes from this visit.  I ordered, reviewed and interpreted labs which include: Respiratory panel, CBC, BMP, troponin, D-dimer.  Influenza A positive.  No significant electrolyte derangement or kidney dysfunction.  No leukocytosis or anemia.  Initial and delta troponin negative.  D-dimer negative.  I ordered imaging studies including chest x-ray I independently visualized and interpreted imaging which showed normal I agree with the radiologist interpretation  Afebrile, initially borderline tachycardic but otherwise hemodynamically stable.  53 year old male  presents to the ED for evaluation of upper respiratory infection symptoms and tachycardia.  Symptoms began this morning.  He was encouraged to come to the emergency department by his cardiologist office.  He appears well on physical exam.  No adventitious breath sounds, respiratory distress, tachypnea.  Lab workup reassuring.  Per he appears to be normal sinus rhythm.  He is not currently in atrial fibrillation.  Respiratory panel positive for influenza A.  Initial and delta troponin negative.  D-dimer negative.  He is anticoagulated on Eliquis  and reports compliance with his medication.  Very low suspicion for PE.  Symptoms likely secondary to influenza A.  I have shared decision-making conversation with the patient regarding Tamiflu .  He wishes to proceed with this.  Believe this is reasonable.  Prescription sent.  He was educated on supportive care for influenza.  He was encouraged to quarantine until symptoms continues to improve for 48 hours.  He was educated on typical timeline of symptoms.  He was given return precautions.  Stable at discharge.  At this time there does not appear to be any evidence of an acute emergency medical condition and the patient appears stable for discharge with appropriate outpatient follow up. Diagnosis was discussed with patient who verbalizes understanding of care plan and is agreeable to discharge. I have discussed return precautions with patient who verbalizes understanding. Patient encouraged to follow-up with their PCP within 1 week. All questions answered.  Note: Portions of this report may have been transcribed using voice recognition software. Every effort was made to ensure accuracy; however, inadvertent computerized transcription errors may still be present.         Final Clinical Impression(s) / ED Diagnoses Final diagnoses:  Influenza A    Rx / DC Orders ED Discharge Orders          Ordered    oseltamivir  (TAMIFLU ) 75 MG capsule  Every 12 hours         07/30/23 2332              Edwardo Marsa HERO, PA-C 07/30/23 2340    Woody Kronberg, Marsa HERO, PA-C 07/30/23 2349    Dreama Longs, MD 07/31/23 1425

## 2023-07-30 NOTE — Discharge Instructions (Addendum)
 You have been seen today for your complaint of fast heart rate, upper respiratory infection symptoms. Your lab work was positive for influenza A, was otherwise reassuring. Your imaging was reassuring and showed no abnormalities. Your discharge medications include Tylenol  for fevers and bodyaches.  You may take up to 1000 mg every 6 hours. Claritin and Flonase for congestion and runny nose.  Take these daily until your symptoms resolve. Tamiflu .  This is an antiviral used to help with influenza infections.  Take this as prescribed and for the entire duration of the prescription. Home care instructions are as follows:  Drink plenty of water.  Eat a normal diet.  Quarantine for at least 5 days and until your symptoms continuously improved for 48 hours. Follow up with: Your primary care provider in 7 days for reevaluation Please seek immediate medical care if you develop any of the following symptoms: You become short of breath or have trouble breathing. Your skin or nails turn blue. You have very bad pain or stiffness in your neck. You get a sudden headache or pain in your face or ear. You vomit each time you eat or drink. At this time there does not appear to be the presence of an emergent medical condition, however there is always the potential for conditions to change. Please read and follow the below instructions.  Do not take your medicine if  develop an itchy rash, swelling in your mouth or lips, or difficulty breathing; call 911 and seek immediate emergency medical attention if this occurs.  You may review your lab tests and imaging results in their entirety on your MyChart account.  Please discuss all results of fully with your primary care provider and other specialist at your follow-up visit.  Note: Portions of this text may have been transcribed using voice recognition software. Every effort was made to ensure accuracy; however, inadvertent computerized transcription errors may still be  present.

## 2023-07-30 NOTE — ED Triage Notes (Signed)
 Pt reports elevated HR today in the 120's after his CT scan (calcium  scan). Pt reports he began to have flu like sx last night with low grade temp, HA, fatigue, and dizziness. Pt endorses some chest tightness x1 week. Pt has cardiac hx and sees cardiology for afib and takes Eliquis ; ablation scheduled in 2 weeks.

## 2023-07-30 NOTE — Telephone Encounter (Signed)
 Patient called after hour line reporting he has A fib ablation scheduled at the end of this month,  has been feeling poor since yesterday, he had cough, congestion, headache. His wife just got over RSV pneumonia. He states he noted his HR has been elevated from baseline 70 to 100s today. He does not have means to monitor if he is in A fib at home. He had taken his metoprolol  and eliquis  2 minutes before this call. Explained to the patient that he can potentially have tachycardia if he suffers a active respiratory infection. No way to tell if he is in A fib or sinus tachycardia. Advised him go to ER if worsening SOB, chest pain, dizziness, syncope, profound fatigue, or if HR sustained 110-120s  without improvement on home meds. Otherwise he can call A fib clinic tomorrow for an earlier follow up appointment. He agreed.

## 2023-07-31 NOTE — ED Notes (Signed)
 AVS provided by edp was discussed with pt. Pt verbalized understanding with no additional questions at this time. Pt verified pharmacy.

## 2023-08-13 ENCOUNTER — Telehealth: Payer: Self-pay | Admitting: Cardiology

## 2023-08-13 NOTE — Telephone Encounter (Signed)
Reviewed pt instructions for upcoming ablation. Patient verbalized understanding and agreeable to plan.

## 2023-08-13 NOTE — Telephone Encounter (Signed)
Patient is calling to ask questions about his ablation on Thursday

## 2023-08-14 NOTE — Pre-Procedure Instructions (Signed)
Instructed patient on the following items: Arrival time 0800 Nothing to eat or drink after midnight No meds AM of procedure Responsible person to drive you home and stay with you for 24 hrs  Have you missed any doses of anti-coagulant Elqiuis- takes twice a day, hasn't missed any doses.  Don't take dose in the morning.

## 2023-08-15 ENCOUNTER — Ambulatory Visit (HOSPITAL_COMMUNITY): Payer: BC Managed Care – PPO

## 2023-08-15 ENCOUNTER — Other Ambulatory Visit: Payer: Self-pay

## 2023-08-15 ENCOUNTER — Ambulatory Visit (HOSPITAL_COMMUNITY)
Admission: RE | Admit: 2023-08-15 | Discharge: 2023-08-15 | Disposition: A | Discharge status: Home / Self Care | Payer: BC Managed Care – PPO | Attending: Cardiology | Admitting: Cardiology

## 2023-08-15 ENCOUNTER — Ambulatory Visit (HOSPITAL_COMMUNITY): Payer: Self-pay

## 2023-08-15 ENCOUNTER — Encounter (HOSPITAL_COMMUNITY)
Admission: RE | Disposition: A | Discharge status: Home / Self Care | Payer: BC Managed Care – PPO | Source: Home / Self Care | Attending: Cardiology

## 2023-08-15 ENCOUNTER — Encounter (HOSPITAL_COMMUNITY): Payer: Self-pay | Admitting: Cardiology

## 2023-08-15 DIAGNOSIS — I4892 Unspecified atrial flutter: Secondary | ICD-10-CM | POA: Insufficient documentation

## 2023-08-15 DIAGNOSIS — I4819 Other persistent atrial fibrillation: Secondary | ICD-10-CM | POA: Insufficient documentation

## 2023-08-15 DIAGNOSIS — K219 Gastro-esophageal reflux disease without esophagitis: Secondary | ICD-10-CM | POA: Diagnosis not present

## 2023-08-15 DIAGNOSIS — G4730 Sleep apnea, unspecified: Secondary | ICD-10-CM | POA: Insufficient documentation

## 2023-08-15 DIAGNOSIS — I10 Essential (primary) hypertension: Secondary | ICD-10-CM | POA: Diagnosis not present

## 2023-08-15 DIAGNOSIS — Z79899 Other long term (current) drug therapy: Secondary | ICD-10-CM | POA: Insufficient documentation

## 2023-08-15 HISTORY — PX: ATRIAL FIBRILLATION ABLATION: EP1191

## 2023-08-15 LAB — POCT ACTIVATED CLOTTING TIME: Activated Clotting Time: 239 s

## 2023-08-15 SURGERY — ATRIAL FIBRILLATION ABLATION
Anesthesia: General

## 2023-08-15 MED ORDER — PROTAMINE SULFATE 10 MG/ML IV SOLN
INTRAVENOUS | Status: DC | PRN
  Administered 2023-08-15: 40 mg via INTRAVENOUS

## 2023-08-15 MED ORDER — HEPARIN SODIUM (PORCINE) 1000 UNIT/ML IJ SOLN
INTRAMUSCULAR | Status: AC
  Filled 2023-08-15: qty 30

## 2023-08-15 MED ORDER — DEXAMETHASONE SODIUM PHOSPHATE 10 MG/ML IJ SOLN
INTRAMUSCULAR | Status: DC | PRN
  Administered 2023-08-15: 5 mg via INTRAVENOUS

## 2023-08-15 MED ORDER — ACETAMINOPHEN 325 MG PO TABS
ORAL_TABLET | ORAL | Status: AC
  Filled 2023-08-15: qty 2

## 2023-08-15 MED ORDER — MIDAZOLAM HCL 2 MG/2ML IJ SOLN
INTRAMUSCULAR | Status: AC
  Filled 2023-08-15: qty 2

## 2023-08-15 MED ORDER — SODIUM CHLORIDE 0.9% FLUSH: 3.0000 mL | INTRAVENOUS | Status: DC | PRN

## 2023-08-15 MED ORDER — HEPARIN (PORCINE) IN NACL 1000-0.9 UT/500ML-% IV SOLN
INTRAVENOUS | Status: DC | PRN
  Administered 2023-08-15 (×3): 500 mL

## 2023-08-15 MED ORDER — FENTANYL CITRATE (PF) 100 MCG/2ML IJ SOLN
INTRAMUSCULAR | Status: AC
  Filled 2023-08-15: qty 2

## 2023-08-15 MED ORDER — PROPOFOL 10 MG/ML IV BOLUS
INTRAVENOUS | Status: DC | PRN
  Administered 2023-08-15: 200 mg via INTRAVENOUS

## 2023-08-15 MED ORDER — MIDAZOLAM HCL 2 MG/2ML IJ SOLN
INTRAMUSCULAR | Status: DC | PRN
  Administered 2023-08-15: 2 mg via INTRAVENOUS

## 2023-08-15 MED ORDER — HEPARIN SODIUM (PORCINE) 1000 UNIT/ML IJ SOLN
INTRAMUSCULAR | Status: DC | PRN
  Administered 2023-08-15: 15000 [IU] via INTRAVENOUS
  Administered 2023-08-15: 8000 [IU] via INTRAVENOUS

## 2023-08-15 MED ORDER — ATROPINE SULFATE 1 MG/10ML IJ SOSY
PREFILLED_SYRINGE | INTRAMUSCULAR | Status: DC | PRN
  Administered 2023-08-15: 1 mg via INTRAVENOUS

## 2023-08-15 MED ORDER — ONDANSETRON HCL 4 MG/2ML IJ SOLN
INTRAMUSCULAR | Status: DC | PRN
  Administered 2023-08-15: 4 mg via INTRAVENOUS

## 2023-08-15 MED ORDER — ACETAMINOPHEN 325 MG PO TABS
650.0000 mg | ORAL_TABLET | ORAL | Status: DC | PRN
Start: 2023-08-15 — End: 2023-08-15
  Administered 2023-08-15: 650 mg via ORAL

## 2023-08-15 MED ORDER — LIDOCAINE 2% (20 MG/ML) 5 ML SYRINGE
INTRAMUSCULAR | Status: DC | PRN
  Administered 2023-08-15: 80 mg via INTRAVENOUS

## 2023-08-15 MED ORDER — SODIUM CHLORIDE 0.9 % IV SOLN: 250.0000 mL | INTRAVENOUS | Status: DC | PRN

## 2023-08-15 MED ORDER — PHENYLEPHRINE 80 MCG/ML (10ML) SYRINGE FOR IV PUSH (FOR BLOOD PRESSURE SUPPORT)
PREFILLED_SYRINGE | INTRAVENOUS | Status: DC | PRN
  Administered 2023-08-15 (×2): 80 ug via INTRAVENOUS

## 2023-08-15 MED ORDER — SODIUM CHLORIDE 0.9 % IV SOLN: INTRAVENOUS | Status: DC

## 2023-08-15 MED ORDER — ONDANSETRON HCL 4 MG/2ML IJ SOLN: 4.0000 mg | Freq: Four times a day (QID) | INTRAMUSCULAR | Status: DC | PRN

## 2023-08-15 MED ORDER — SUGAMMADEX SODIUM 200 MG/2ML IV SOLN
INTRAVENOUS | Status: DC | PRN
  Administered 2023-08-15 (×2): 200 mg via INTRAVENOUS

## 2023-08-15 MED ORDER — FENTANYL CITRATE (PF) 100 MCG/2ML IJ SOLN
INTRAMUSCULAR | Status: DC | PRN
  Administered 2023-08-15: 100 ug via INTRAVENOUS

## 2023-08-15 MED ORDER — ROCURONIUM BROMIDE 10 MG/ML (PF) SYRINGE
PREFILLED_SYRINGE | INTRAVENOUS | Status: DC | PRN
  Administered 2023-08-15: 10 mg via INTRAVENOUS
  Administered 2023-08-15: 90 mg via INTRAVENOUS

## 2023-08-15 SURGICAL SUPPLY — 21 items
BAG SNAP BAND KOVER 36X36 (MISCELLANEOUS) IMPLANT
CABLE PFA RX CATH CONN (CABLE) IMPLANT
CATH FARAWAVE ABLATION 31 (CATHETERS) IMPLANT
CATH OCTARAY 2.0 F 3-3-3-3-3 (CATHETERS) IMPLANT
CATH SOUNDSTAR ECO 8FR (CATHETERS) IMPLANT
CATH WEBSTER BI DIR CS D-F CRV (CATHETERS) IMPLANT
CLOSURE MYNX CONTROL 6F/7F (Vascular Products) IMPLANT
CLOSURE PERCLOSE PROSTYLE (VASCULAR PRODUCTS) IMPLANT
COVER SWIFTLINK CONNECTOR (BAG) ×1 IMPLANT
DILATOR VESSEL 38 20CM 16FR (INTRODUCER) IMPLANT
GUIDEWIRE INQWIRE 1.5J.035X260 (WIRE) IMPLANT
INQWIRE 1.5J .035X260CM (WIRE) ×1 IMPLANT
KIT VERSACROSS CNCT FARADRIVE (KITS) IMPLANT
MAT PREVALON FULL STRYKER (MISCELLANEOUS) IMPLANT
PACK EP LF (CUSTOM PROCEDURE TRAY) ×1 IMPLANT
PAD DEFIB RADIO PHYSIO CONN (PAD) ×1 IMPLANT
PATCH CARTO3 (PAD) IMPLANT
SHEATH FARADRIVE STEERABLE (SHEATH) IMPLANT
SHEATH PINNACLE 8F 10CM (SHEATH) IMPLANT
SHEATH PINNACLE 9F 10CM (SHEATH) IMPLANT
SHEATH PROBE COVER 6X72 (BAG) IMPLANT

## 2023-08-15 NOTE — H&P (Signed)
  Electrophysiology Office Note:   Date:  08/15/2023  ID:  Joshua A Milby, DOB December 16, 1970, MRN [id number]  Primary Cardiologist: Nicki Guadalajara, MD Electrophysiologist: Zohar Laing Jorja Loa, MD      History of Present Illness:   Joshua Simpson is a 53 y.o. male with h/o hypertension, sleep apnea, atrial fibrillation/flutter seen today for routine electrophysiology followup.   Today, denies symptoms of palpitations, chest pain, shortness of breath, orthopnea, PND, lower extremity edema, claudication, dizziness, presyncope, syncope, bleeding, or neurologic sequela. The patient is tolerating medications without difficulties. Plan ablation today.   EP Information / Studies Reviewed:    EKG is not ordered today. EKG from 03/04/23 reviewed which showed sinus rhythm        Risk Assessment/Calculations:    CHA2DS2-VASc Score = 1   This indicates a 0.6% annual risk of stroke. The patient's score is based upon: CHF History: 0 HTN History: 1 Diabetes History: 0 Stroke History: 0 Vascular Disease History: 0 Age Score: 0 Gender Score: 0      Physical Exam:   VS:  BP 118/83 (BP Location: Left Arm)   Temp (!) 97.5 F (36.4 C) (Oral)   Resp 18   Ht 6' (1.829 m)   Wt 131.5 kg   SpO2 96%   BMI 39.33 kg/m    Wt Readings from Last 3 Encounters:  08/15/23 131.5 kg  07/30/23 129.3 kg  05/24/23 134.7 kg    GEN: No acute distress.   Neck: No JVD Cardiac: RRR, no murmurs, rubs, or gallops.  Respiratory: decreased BS bases bilaterally. GI: Soft, nontender, non-distended  MS: No edema; No deformity. Neuro:  Nonfocal  Skin: warm and dry Psych: Normal affect    ASSESSMENT AND PLAN:    1.  Paroxysmal atrial fibrillation/flutter: Joshua A Knotts has presented today for surgery, with the diagnosis of AF.  The various methods of treatment have been discussed with the patient and family. After consideration of risks, benefits and other options for treatment, the patient has consented to   Procedure(s): Catheter ablation as a surgical intervention .  Risks include but not limited to complete heart block, stroke, esophageal damage, nerve damage, bleeding, vascular damage, tamponade, perforation, MI, and death. The patient's history has been reviewed, patient examined, no change in status, stable for surgery.  I have reviewed the patient's chart and labs.  Questions were answered to the patient's satisfaction.    Geraldin Habermehl Elberta Fortis, MD 08/15/2023 8:53 AM

## 2023-08-15 NOTE — Transfer of Care (Signed)
Immediate Anesthesia Transfer of Care Note  Patient: Joshua Simpson  Procedure(s) Performed: ATRIAL FIBRILLATION ABLATION  Patient Location: PACU and Cath Lab  Anesthesia Type:General  Level of Consciousness: awake, alert , and patient cooperative  Airway & Oxygen Therapy: Patient Spontanous Breathing and Patient connected to face mask oxygen  Post-op Assessment: Report given to RN and Post -op Vital signs reviewed and stable  Post vital signs: Reviewed and stable  Last Vitals:  Vitals Value Taken Time  BP 129/96 08/15/23 1125  Temp    Pulse 82 08/15/23 1128  Resp 22 08/15/23 1128  SpO2 90 % 08/15/23 1128  Vitals shown include unfiled device data.  Last Pain:  Vitals:   08/15/23 0821  TempSrc: Oral         Complications: There were no known notable events for this encounter.

## 2023-08-15 NOTE — Discharge Instructions (Signed)

## 2023-08-15 NOTE — Anesthesia Procedure Notes (Signed)
Procedure Name: Intubation Date/Time: 08/15/2023 9:56 AM  Performed by: Darlina Guys, CRNAPre-anesthesia Checklist: Patient identified, Emergency Drugs available, Suction available and Patient being monitored Patient Re-evaluated:Patient Re-evaluated prior to induction Oxygen Delivery Method: Circle System Utilized Preoxygenation: Pre-oxygenation with 100% oxygen Induction Type: IV induction Ventilation: Mask ventilation without difficulty Laryngoscope Size: Glidescope and 3 Grade View: Grade II Tube type: Oral Tube size: 7.5 mm Number of attempts: 1 Airway Equipment and Method: Stylet and Oral airway Placement Confirmation: ETT inserted through vocal cords under direct vision, positive ETCO2 and breath sounds checked- equal and bilateral Secured at: 23 cm Tube secured with: Tape Dental Injury: Teeth and Oropharynx as per pre-operative assessment  Comments: Grade 2 view with GS 3 blade- likely would be better view with GS 4 blade. Atraumatic induction/intubation. Dentition and oral mucosa as per preop.

## 2023-08-15 NOTE — Progress Notes (Signed)
Patient walked to the bathroom without difficulties. Bilateral groins level 0, clean, dry, and intact.

## 2023-08-15 NOTE — Anesthesia Preprocedure Evaluation (Signed)
Anesthesia Evaluation  Patient identified by MRN, date of birth, ID band Patient awake    Reviewed: Allergy & Precautions, NPO status , Patient's Chart, lab work & pertinent test results  History of Anesthesia Complications Negative for: history of anesthetic complications  Airway Mallampati: III  TM Distance: >3 FB Neck ROM: Full    Dental  (+) Dental Advisory Given, Teeth Intact   Pulmonary neg sleep apnea, neg COPD, neg recent URI   breath sounds clear to auscultation       Cardiovascular hypertension, Pt. on medications and Pt. on home beta blockers (-) angina (-) Past MI + dysrhythmias Atrial Fibrillation  Rhythm:Irregular   1. Left ventricular ejection fraction, by estimation, is 60 to 65%. The  left ventricle has normal function. The left ventricle has no regional  wall motion abnormalities. Left ventricular diastolic parameters are  consistent with Grade II diastolic  dysfunction (pseudonormalization).   2. Right ventricular systolic function is normal. The right ventricular  size is normal.   3. The mitral valve is normal in structure. Trivial mitral valve  regurgitation. No evidence of mitral stenosis.   4. The aortic valve is normal in structure. Aortic valve regurgitation is  not visualized. No aortic stenosis is present.   5. The inferior vena cava is normal in size with greater than 50%  respiratory variability, suggesting right atrial pressure of 3 mmHg.     Neuro/Psych   Anxiety     negative neurological ROS     GI/Hepatic Neg liver ROS,GERD  Medicated,,  Endo/Other  negative endocrine ROS    Renal/GU negative Renal ROS     Musculoskeletal negative musculoskeletal ROS (+)    Abdominal   Peds  Hematology Lab Results      Component                Value               Date                      WBC                      6.6                 07/30/2023                HGB                      16.8                 07/30/2023                HCT                      47.5                07/30/2023                MCV                      89.1                07/30/2023                PLT                      171  07/30/2023             eliquis   Anesthesia Other Findings   Reproductive/Obstetrics                              Anesthesia Physical Anesthesia Plan  ASA: 3  Anesthesia Plan: General   Post-op Pain Management:    Induction: Intravenous  PONV Risk Score and Plan: Ondansetron, Dexamethasone and Midazolam  Airway Management Planned: Oral ETT  Additional Equipment: None  Intra-op Plan:   Post-operative Plan: Extubation in OR  Informed Consent: I have reviewed the patients History and Physical, chart, labs and discussed the procedure including the risks, benefits and alternatives for the proposed anesthesia with the patient or authorized representative who has indicated his/her understanding and acceptance.     Dental advisory given  Plan Discussed with: CRNA and Anesthesiologist  Anesthesia Plan Comments:          Anesthesia Quick Evaluation

## 2023-08-16 NOTE — Anesthesia Postprocedure Evaluation (Signed)
Anesthesia Post Note  Patient: Joshua Simpson  Procedure(s) Performed: ATRIAL FIBRILLATION ABLATION     Patient location during evaluation: Cath Lab Anesthesia Type: General Level of consciousness: awake and alert Pain management: pain level controlled Vital Signs Assessment: post-procedure vital signs reviewed and stable Respiratory status: spontaneous breathing, nonlabored ventilation, respiratory function stable and patient connected to nasal cannula oxygen Cardiovascular status: blood pressure returned to baseline and stable Postop Assessment: no apparent nausea or vomiting Anesthetic complications: no   There were no known notable events for this encounter.                Margarito Dehaas

## 2023-08-18 ENCOUNTER — Emergency Department (HOSPITAL_BASED_OUTPATIENT_CLINIC_OR_DEPARTMENT_OTHER): Payer: BC Managed Care – PPO

## 2023-08-18 ENCOUNTER — Encounter (HOSPITAL_BASED_OUTPATIENT_CLINIC_OR_DEPARTMENT_OTHER): Payer: Self-pay | Admitting: Emergency Medicine

## 2023-08-18 ENCOUNTER — Emergency Department (HOSPITAL_BASED_OUTPATIENT_CLINIC_OR_DEPARTMENT_OTHER)
Admission: EM | Admit: 2023-08-18 | Discharge: 2023-08-18 | Disposition: A | Discharge status: Home / Self Care | Payer: BC Managed Care – PPO | Attending: Emergency Medicine | Admitting: Emergency Medicine

## 2023-08-18 ENCOUNTER — Other Ambulatory Visit: Payer: Self-pay

## 2023-08-18 ENCOUNTER — Telehealth: Payer: Self-pay | Admitting: Home Health

## 2023-08-18 DIAGNOSIS — J101 Influenza due to other identified influenza virus with other respiratory manifestations: Secondary | ICD-10-CM | POA: Diagnosis not present

## 2023-08-18 DIAGNOSIS — I480 Paroxysmal atrial fibrillation: Secondary | ICD-10-CM | POA: Insufficient documentation

## 2023-08-18 DIAGNOSIS — R079 Chest pain, unspecified: Secondary | ICD-10-CM

## 2023-08-18 DIAGNOSIS — Z7901 Long term (current) use of anticoagulants: Secondary | ICD-10-CM | POA: Diagnosis not present

## 2023-08-18 DIAGNOSIS — R0789 Other chest pain: Secondary | ICD-10-CM | POA: Diagnosis present

## 2023-08-18 DIAGNOSIS — Z79899 Other long term (current) drug therapy: Secondary | ICD-10-CM | POA: Insufficient documentation

## 2023-08-18 LAB — CBC WITH DIFFERENTIAL/PLATELET
Abs Immature Granulocytes: 0.04 10*3/uL (ref 0.00–0.07)
Basophils Absolute: 0.1 10*3/uL (ref 0.0–0.1)
Basophils Relative: 1 %
Eosinophils Absolute: 0.1 10*3/uL (ref 0.0–0.5)
Eosinophils Relative: 1 %
HCT: 45.4 % (ref 39.0–52.0)
Hemoglobin: 15.9 g/dL (ref 13.0–17.0)
Immature Granulocytes: 0 %
Lymphocytes Relative: 21 %
Lymphs Abs: 2.1 10*3/uL (ref 0.7–4.0)
MCH: 31.4 pg (ref 26.0–34.0)
MCHC: 35 g/dL (ref 30.0–36.0)
MCV: 89.7 fL (ref 80.0–100.0)
Monocytes Absolute: 0.9 10*3/uL (ref 0.1–1.0)
Monocytes Relative: 9 %
Neutro Abs: 6.9 10*3/uL (ref 1.7–7.7)
Neutrophils Relative %: 68 %
Platelets: 209 10*3/uL (ref 150–400)
RBC: 5.06 MIL/uL (ref 4.22–5.81)
RDW: 12.9 % (ref 11.5–15.5)
WBC: 10.2 10*3/uL (ref 4.0–10.5)
nRBC: 0 % (ref 0.0–0.2)

## 2023-08-18 LAB — RESP PANEL BY RT-PCR (RSV, FLU A&B, COVID)  RVPGX2
Influenza A by PCR: POSITIVE — AB
Influenza B by PCR: NEGATIVE
Resp Syncytial Virus by PCR: NEGATIVE
SARS Coronavirus 2 by RT PCR: NEGATIVE

## 2023-08-18 LAB — BASIC METABOLIC PANEL
Anion gap: 10 (ref 5–15)
BUN: 11 mg/dL (ref 6–20)
CO2: 21 mmol/L — ABNORMAL LOW (ref 22–32)
Calcium: 9.4 mg/dL (ref 8.9–10.3)
Chloride: 106 mmol/L (ref 98–111)
Creatinine, Ser: 0.84 mg/dL (ref 0.61–1.24)
GFR, Estimated: >60 mL/min (ref >60)
Glucose, Bld: 118 mg/dL — ABNORMAL HIGH (ref 70–99)
Potassium: 3.8 mmol/L (ref 3.5–5.1)
Sodium: 137 mmol/L (ref 135–145)

## 2023-08-18 LAB — TROPONIN I (HIGH SENSITIVITY)
Troponin I (High Sensitivity): 323 ng/L (ref <18)
Troponin I (High Sensitivity): 319 ng/L (ref <18)

## 2023-08-18 LAB — BRAIN NATRIURETIC PEPTIDE: B Natriuretic Peptide: 47.8 pg/mL (ref 0.0–100.0)

## 2023-08-18 LAB — SEDIMENTATION RATE: Sed Rate: 5 mm/h (ref 0–16)

## 2023-08-18 MED ORDER — COLCHICINE 0.6 MG PO TABS: 0.6000 mg | ORAL_TABLET | Freq: Two times a day (BID) | ORAL | 0 refills | Status: DC

## 2023-08-18 MED ORDER — IOHEXOL 350 MG/ML SOLN
75.0000 mL | Freq: Once | INTRAVENOUS | Status: AC | PRN
  Administered 2023-08-18: 75 mL via INTRAVENOUS

## 2023-08-18 NOTE — ED Notes (Signed)
 Patient transported to CT

## 2023-08-18 NOTE — ED Triage Notes (Signed)
Pt had an ablation Thurs; hasn't felt well since; now with prod cough; general weakness; LT side CP, describes as pressure/sharp, intermittent

## 2023-08-18 NOTE — ED Provider Notes (Signed)
[place] [facility] Provider Note   CSN: [id number] Arrival date & time: 08/18/23  1311     History  Chief Complaint  Patient presents with   Chest Pain    Joshua Simpson is a 53 y.o. male, history of A-fib, status post ablation, who presents to the ED secondary to increased fatigue, productive cough, coughing up blood, left-sided chest pain, has been going on for the last 2 days.  He states the pain is sharp and stabbing, in the left breast, and occurred directly after having his ablation done.  Also reports a sore throat, after having his ablation done in 1/31 for A-fib.  Has been compliant with his Eliquis has only missed 1 dose, the day of the ablation.  Denies any kind of nausea, vomiting, headaches, fevers, chills, or runny nose.  States that the pain in his left chest is worse when taking a deep breath, and he just feels very out of it.  Home Medications Prior to Admission medications   Medication Sig Start Date End Date Taking? Authorizing Provider  colchicine 0.6 MG tablet Take 1 tablet (0.6 mg total) by mouth 2 (two) times daily for 14 days. 08/18/23 09/01/23 Yes Xzaviar Maloof L, PA  ALPRAZolam (XANAX) 0.5 MG tablet Take 0.5 mg by mouth 3 (three) times daily as needed for anxiety. 07/06/21   [provider]  apixaban (ELIQUIS) 5 MG TABS tablet Take 1 tablet (5 mg total) by mouth 2 (two) times daily. 06/17/23   Camnitz, Andree Coss, MD  cetirizine (ZYRTEC) 10 MG tablet Take 10 mg by mouth daily.    [provider]  lisinopril (ZESTRIL) 10 MG tablet Take 10 mg by mouth daily.    [provider]  metoprolol succinate (TOPROL-XL) 100 MG 24 hr tablet Take 1 tablet (100 mg total) by mouth 2 (two) times daily. Take with or immediately following a meal. 05/06/23   Camnitz, Andree Coss, MD  nitroGLYCERIN (NITROSTAT) 0.4 MG SL tablet Place 0.4 mg under the tongue every 5 (five) minutes as needed for chest pain.    [provider]  pantoprazole (PROTONIX) 40 MG tablet Take 40 mg by mouth 2 (two) times daily before a meal. 08/28/21   [provider]  propafenone (RYTHMOL SR) 325 MG 12 hr capsule Take 1 capsule (325 mg total) by mouth 2 (two) times daily. Patient not taking: Reported on 05/24/2023 04/02/23   Regan Lemming, MD  rosuvastatin (CRESTOR) 40 MG tablet TAKE 1 TABLET BY MOUTH DAILY 06/04/23   Lennette Bihari, MD      Allergies    Ciprofloxacin and Trolamine salicylate    Review of Systems   Review of Systems  Respiratory:  Negative for shortness of breath.   Cardiovascular:  Positive for chest pain.    Physical Exam Updated Vital Signs BP 109/84   Pulse 77   Temp 97.9 F (36.6 C)   Resp 12   Ht 6' (1.829 m)   Wt 131.5 kg   SpO2 97%   BMI 39.32 kg/m  Physical Exam Vitals and nursing note reviewed.  Constitutional:      General: He is not in acute distress.    Appearance: He is well-developed.  HENT:     Head: Normocephalic and atraumatic.  Eyes:     Conjunctiva/sclera: Conjunctivae normal.  Cardiovascular:     Rate and Rhythm: Normal rate and regular rhythm.     Heart sounds: No murmur heard. Pulmonary:  Effort: Pulmonary effort is normal. No respiratory distress.     Breath sounds: Normal breath sounds.  Abdominal:     Palpations: Abdomen is soft.     Tenderness: There is no abdominal tenderness.  Musculoskeletal:        General: No swelling.     Cervical back: Neck supple.  Skin:    General: Skin is warm and dry.     Capillary Refill: Capillary refill takes less than 2 seconds.  Neurological:     Mental Status: He is alert.  Psychiatric:        Mood and Affect: Mood normal.     ED Results / Procedures / Treatments   Labs (all labs ordered are listed, but only abnormal results are displayed) Labs Reviewed  RESP PANEL BY RT-PCR (RSV, FLU A&B, COVID)  RVPGX2 - Abnormal; Notable for the following components:      Result Value   Influenza A by  PCR POSITIVE (*)    All other components within normal limits  BASIC METABOLIC PANEL - Abnormal; Notable for the following components:   CO2 21 (*)    Glucose, Bld 118 (*)    All other components within normal limits  TROPONIN I (HIGH SENSITIVITY) - Abnormal; Notable for the following components:   Troponin I (High Sensitivity) 323 (*)    All other components within normal limits  TROPONIN I (HIGH SENSITIVITY) - Abnormal; Notable for the following components:   Troponin I (High Sensitivity) 319 (*)    All other components within normal limits  CBC WITH DIFFERENTIAL/PLATELET  BRAIN NATRIURETIC PEPTIDE  SEDIMENTATION RATE  C-REACTIVE PROTEIN    EKG None  Radiology CT Angio Chest PE W and/or Wo Contrast Result Date: 08/18/2023 CLINICAL DATA:  Left-sided chest pain. EXAM: CT ANGIOGRAPHY CHEST WITH CONTRAST TECHNIQUE: Multidetector CT imaging of the chest was performed using the standard protocol during bolus administration of intravenous contrast. Multiplanar CT image reconstructions and MIPs were obtained to evaluate the vascular anatomy. RADIATION DOSE REDUCTION: This exam was performed according to the departmental dose-optimization program which includes automated exposure control, adjustment of the mA and/or kV according to patient size and/or use of iterative reconstruction technique. CONTRAST:  75mL OMNIPAQUE IOHEXOL 350 MG/ML SOLN COMPARISON:  July 30, 2023. FINDINGS: Cardiovascular: Satisfactory opacification of the pulmonary arteries to the segmental level. No evidence of pulmonary embolism. Normal heart size. No pericardial effusion. Mediastinum/Nodes: No enlarged mediastinal, hilar, or axillary lymph nodes. Thyroid gland, trachea, and esophagus demonstrate no significant findings. Lungs/Pleura: Lungs are clear. No pleural effusion or pneumothorax. Upper Abdomen: No acute abnormality. Musculoskeletal: No chest wall abnormality. No acute or significant osseous findings. Review of the  MIP images confirms the above findings. IMPRESSION: No definite evidence of pulmonary embolus. Electronically Signed   By: Lupita Raider M.D.   On: 08/18/2023 15:07   DG Chest 2 View Result Date: 08/18/2023 CLINICAL DATA:  Chest pain. EXAM: CHEST - 2 VIEW COMPARISON:  July 30, 2023. FINDINGS: The heart size and mediastinal contours are within normal limits. Both lungs are clear. The visualized skeletal structures are unremarkable. IMPRESSION: No active cardiopulmonary disease. Electronically Signed   By: Lupita Raider M.D.   On: 08/18/2023 13:45    Procedures Procedures    Medications Ordered in ED Medications  iohexol (OMNIPAQUE) 350 MG/ML injection 75 mL (75 mLs Intravenous Contrast Given 08/18/23 1451)    ED Course/ Medical Decision Making/ A&P  HEART Score: 6                    Medical Decision Making Patient is a 53 year old male, here for chest pain, has been going on for the last 2 days after having ablation for A-fib.  He also states he feels very fatigued and has hemoptysis.  Will obtain a chest x-ray, CTA, troponins for further evaluation.  As he is postop, having hemoptysis, and persistent chest pain.  Will obtain COVID/flu, given fatigue  Amount and/or Complexity of Data Reviewed Labs: ordered.    Details: Troponins elevated, but stable, at 323 and 319, positive for influenza Radiology: ordered.    Details: CTA shows no definitive evidence of pulmonary embolism ECG/medicine tests:  Decision-making details documented in ED Course. Discussion of management or test interpretation with external provider(s): Patient has some new T wave inversions, however CTA shows no evidence of any kind of PE or pericardial effusion.  I spoke to cardiology, Dr. Jenene Slicker, she reviewed patient's information, and recommends that he be put on colchicine 0.6 mg, twice daily, for 2 weeks and follow-up with EP.  I provided this information with the patient, he is in agreement with  plan.  Discharge, he is out of the range, for Tamiflu, thus not given Tamiflu.  Hemoptysis may have been secondary to frequent coughing, from influenza.  We discussed return precautions and he was discharged home  Risk Prescription drug management.   Final Clinical Impression(s) / ED Diagnoses Final diagnoses:  Chest pain, unspecified type  Influenza A  Paroxysmal atrial fibrillation (HCC)    Rx / DC Orders ED Discharge Orders          Ordered    Ambulatory referral to Cardiology       Comments: If you have not heard from the Cardiology office within the next 72 hours please call [phone]. Needs f/u w/EP clinic in 2 weeks per cards on call   08/18/23 1639    colchicine 0.6 MG tablet  2 times daily        08/18/23 1639              Nalini Alcaraz Elbert Ewings, PA 08/18/23 1856    Rozelle Logan, DO 09/04/23 0830

## 2023-08-18 NOTE — Telephone Encounter (Signed)
Patient called after hour line today, states he had A fib ablation 08/15/23, he has been very poor for the past 3 days. He noted nausea, intermittent chest pain that is pleuritic in nature at times, cough with blood tinted phlegm intermittently, irregular heartbeat.  He did not notice any fever.  He felt his chest pain is stable but not improving.  He feels overall poor and could not function like normal.  Reviewed the case with Dr. Jimmey Ralph, suspect hemoptysis is due to recent intubation and esophageal temp probe, query if there is pericarditis, advised patient go to the nearest ER for in person evaluation, will likely need labs and EKG. Patient has agreed.

## 2023-08-18 NOTE — Discharge Instructions (Addendum)
The cause of your chest pain may be secondary to inflammation around the sac around the heart.  This is called pericarditis.  We are going to go ahead and treat you for it with colchicine, and have you follow-up with the EP clinic, in 2 weeks.  Please take the colchicine as prescribed, started today.  Please continue take your medications as prescribed, and take Tylenol for your flu symptoms.  Make sure you are resting lots, and drinking lots of fluids.

## 2023-08-19 LAB — C-REACTIVE PROTEIN: CRP: 1.2 mg/dL — ABNORMAL HIGH (ref <1.0)

## 2023-08-20 ENCOUNTER — Telehealth: Payer: BC Managed Care – PPO | Admitting: Physician Assistant

## 2023-08-20 DIAGNOSIS — I480 Paroxysmal atrial fibrillation: Secondary | ICD-10-CM | POA: Diagnosis not present

## 2023-08-20 DIAGNOSIS — R002 Palpitations: Secondary | ICD-10-CM

## 2023-08-20 DIAGNOSIS — R0781 Pleurodynia: Secondary | ICD-10-CM | POA: Diagnosis not present

## 2023-08-20 DIAGNOSIS — D6869 Other thrombophilia: Secondary | ICD-10-CM

## 2023-08-20 NOTE — Progress Notes (Addendum)
 Virtual Visit via Video Note   Because of Joshua Simpson's co-morbid illnesses, he is at least at moderate risk for complications without adequate follow up.  This format is felt to be most appropriate for this patient at this time.  All issues noted in this document were discussed and addressed.  A limited physical exam was performed with this format.  Please refer to the patient's chart for his consent to telehealth for [name].   The patient was identified using 2 identifiers.  He was made aware of the rational for video visit and was agreeable to proceed    Cardiology Office Note:  .   Date:  08/20/2023  ID:  Joshua Simpson, DOB Aug 17, 1970, MRN [id number] PCP: Silvano Angeline FALCON, NP  [facility] Providers Cardiologist:  Debby Sor, MD Electrophysiologist:  Will Gladis Norton, MD {  History of Present Illness: .   Joshua Simpson is a 53 y.o. male w/PMHx of HTN, HLD, anxiety, GERD, OSA (described as severe) w/CPAP, AFib  Pt saw Dr. Norton 04/02/23, reported recurrent episodes of Afib/flutter, as well as PACs, started on propafenone  with plans to pursue repeat ablation  Afib ablation 08/15/23  ER visit 08/18/23 feeling poorly, progressive fatigue, sore throat sincehis procedure, reported CP described as sharp/stabbing worse with deep breath. Cough reported with some degree of hemoptysis LABS K+ 3.8 BUN/Creat 11/0.84 BNP 47.8 HS Trop 323, 319 CRP 1.2 ESR 5 WBC 10.2 H/H 15/45 Plts 209 FLU A + CT chest neg for PE, no pericardial effusion (noted below) EKGs were non-ischemic In conversation with cardiology on call, recommended colchicine  0.6mg  BID for 2 weeks and f/u with EP Felt to be out the window for TamiFlu  Today's visit is scheduled as post ER visit  ROS:   He is starting to feel better Cough is less productive and has not seen any blood in 2 days now CP is also better/resolved. Started the colchicine  the evening of his ER visit > had some stomach  upset yesterday > none today No SOB Groin sites are stable He feels palpitations that he feels are his PACs Reports compliance with his Eliquis    Arrhythmia/AAD hx AFib found Nov 2022 Flecainide  started Dec 2022 >> stopped jan 2023 2/2 dizziness (it seems) Amioadrone started Jan 2023 >> stopped post ablation June 2023 PVI and CTI ablation 09/26/21 Propafenone  ordered Sept 2024 >> he never started PVI ablation 08/14/22 (PFA)  Studies Reviewed: SABRA    EKG done 08/18/23 and reviewed by myself:  SR 77bpm, 2 PVCs, no acute/ischemic changes Personally measured QT 400/QTc423ms  08/18/23: CT chest FINDINGS: Cardiovascular: Satisfactory opacification of the pulmonary arteries to the segmental level. No evidence of pulmonary embolism. Normal heart size. No pericardial effusion.   Mediastinum/Nodes: No enlarged mediastinal, hilar, or axillary lymph nodes. Thyroid  gland, trachea, and esophagus demonstrate no significant findings.   Lungs/Pleura: Lungs are clear. No pleural effusion or pneumothorax.   Upper Abdomen: No acute abnormality.   Musculoskeletal: No chest wall abnormality. No acute or significant osseous findings.   Review of the MIP images confirms the above findings.   IMPRESSION: No definite evidence of pulmonary embolus.    08/15/23: EPS/ablation CONCLUSIONS: 1. Sinus rhythm upon presentation.   2. Successful electrical isolation and anatomical encircling of all four pulmonary veins with pulse field ablation. 3. Ablation of posterior wall with pulse field ablation 4. No early apparent complications.   07/30/2023: cardiac CT IMPRESSION: 1. There is normal pulmonary vein drainage into the  left atrium. 2. There is no thrombus in the left atrial appendage. 3. The esophagus runs in the left atrial midline and is not in proximity to any of the pulmonary vein ostia. 4. Small PFO. 5. CAC score of 0.   09/26/21: EPS/ablation CONCLUSIONS: 1. Sinus rhythm upon  presentation.   2. Successful electrical isolation and anatomical encircling of all four pulmonary veins with radiofrequency current.    3. Cavo-tricuspid isthmus ablation was performed with complete bidirectional isthmus block achieved.  4. No inducible arrhythmias following ablation both on and off of dobutamine  5. No early apparent complications.   07/06/21: TTE 1. Left ventricular ejection fraction, by estimation, is 60 to 65%. The  left ventricle has normal function. The left ventricle has no regional  wall motion abnormalities. Left ventricular diastolic parameters are  consistent with Grade II diastolic  dysfunction (pseudonormalization).   2. Right ventricular systolic function is normal. The right ventricular  size is normal.   3. The mitral valve is normal in structure. Trivial mitral valve  regurgitation. No evidence of mitral stenosis.   4. The aortic valve is normal in structure. Aortic valve regurgitation is  not visualized. No aortic stenosis is present.   5. The inferior vena cava is normal in size with greater than 50%  respiratory variability, suggesting right atrial pressure of 3 mmHg.    Risk Assessment/Calculations:    Physical Exam:   VS:  There were no vitals taken for this visit.   Wt Readings from Last 3 Encounters:  08/18/23 289 lb 14.5 oz (131.5 kg)  08/15/23 290 lb (131.5 kg)  07/30/23 285 lb (129.3 kg)    He looks well, comfortable in an in no distress Speaks in full sentences with ease No coughing  Reports his BP today 124/74, HR 82bpm   ASSESSMENT AND PLAN: .    paroxymal AFib CHA2DS2Vasc is one, on Eliquis , appropriately dosed Some palpitations post abletion We discussed he may have some post ablation, keep us  yupdated of he feels like he is having persistant palpitations or AFib  CP, cough, SOB, fatigue More likely 2/2 the Flu CT reassuring with no pericardial effusion, no tracheal or esophageal trauma/abnormality EKGs  non-ischemic Coronary Ca++ zero VSS in the ER, and today by his home check  He is starting to feel better each day No ongoing CP Cough is settling down with no ongoing hemoptysis Advised adequate hydration and oral intake  Discussed potential GI side effects of the colchicine  > ok to dose down to daily if needed  Secondary hypercoagulable state 2/2 AFib   Time spent on today's visit 30seconds    Dispo: as scheduled with the AFib clinic, sooner if needed    Signed, Charlies Macario Arthur, PA-C

## 2023-08-21 ENCOUNTER — Telehealth: Payer: Self-pay | Admitting: Cardiology

## 2023-08-21 NOTE — Telephone Encounter (Signed)
 Spoke with patient and he stated he needs a letter from provider stating he is free to return to work with no restrictions form his surgery.

## 2023-08-21 NOTE — Telephone Encounter (Signed)
 Pt would like a c/b regarding Return to Work note. Please advise

## 2023-08-22 ENCOUNTER — Telehealth (HOSPITAL_COMMUNITY): Payer: Self-pay

## 2023-08-22 ENCOUNTER — Encounter (HOSPITAL_COMMUNITY): Payer: Self-pay

## 2023-08-22 ENCOUNTER — Ambulatory Visit: Payer: BC Managed Care – PPO | Admitting: Pulmonary Disease

## 2023-08-22 NOTE — Telephone Encounter (Signed)
 Patient called to let us  know he is still having some swelling and soreness on both sides of his groin, right worse than left. He has a small marble sized area of swelling. Reviewed with Daril Bryant reassured patient this is normal post ablation. Patient will continue to monitor for now. Instructed patient to call clinic back if the swelling worsens, if he has drainage or bleeding at the site or signs of infection. Consulted with patient and he verbalized understanding.

## 2023-08-30 ENCOUNTER — Ambulatory Visit: Payer: BC Managed Care – PPO | Admitting: Pulmonary Disease

## 2023-09-12 ENCOUNTER — Encounter (HOSPITAL_COMMUNITY): Payer: Self-pay | Admitting: Physician Assistant

## 2023-09-12 ENCOUNTER — Ambulatory Visit (HOSPITAL_COMMUNITY)
Admission: RE | Admit: 2023-09-12 | Discharge: 2023-09-12 | Disposition: A | Discharge status: Home / Self Care | Payer: BC Managed Care – PPO | Source: Ambulatory Visit | Attending: Physician Assistant | Admitting: Physician Assistant

## 2023-09-12 VITALS — BP 124/88 | HR 76 | Ht 72.0 in | Wt 306.4 lb

## 2023-09-12 DIAGNOSIS — Z79899 Other long term (current) drug therapy: Secondary | ICD-10-CM | POA: Insufficient documentation

## 2023-09-12 DIAGNOSIS — I480 Paroxysmal atrial fibrillation: Secondary | ICD-10-CM | POA: Diagnosis present

## 2023-09-12 DIAGNOSIS — I4892 Unspecified atrial flutter: Secondary | ICD-10-CM | POA: Insufficient documentation

## 2023-09-12 DIAGNOSIS — Z7901 Long term (current) use of anticoagulants: Secondary | ICD-10-CM | POA: Diagnosis not present

## 2023-09-12 DIAGNOSIS — E669 Obesity, unspecified: Secondary | ICD-10-CM | POA: Insufficient documentation

## 2023-09-12 DIAGNOSIS — Z6841 Body Mass Index (BMI) 40.0 and over, adult: Secondary | ICD-10-CM | POA: Diagnosis not present

## 2023-09-12 DIAGNOSIS — I10 Essential (primary) hypertension: Secondary | ICD-10-CM | POA: Diagnosis not present

## 2023-09-12 DIAGNOSIS — G4733 Obstructive sleep apnea (adult) (pediatric): Secondary | ICD-10-CM | POA: Insufficient documentation

## 2023-09-12 NOTE — Progress Notes (Signed)
 Primary Care Physician: Erskine Emery, NP Primary Cardiologist: Dr Tresa Endo Primary Electrophysiologist: Dr Elberta Fortis Referring Physician: [facility]   Joshua Simpson is a 53 y.o. male with a history of HTN, OSA, and atrial fibrillation who presents for follow up in the [facility].  The patient was initially diagnosed with atrial fibrillation 06/07/21 after presenting to his PCP with sinus congestion. ECG showed rapid afib and he was sent to the ED. He was discharged in rate controlled afib. He has a long history of PACs and palpitations. Patient has a CHADS2VASC score of 1. He converted to SR after leaving the ED. He drinks alcohol occasionally but does report snoring and daytime somnolence. He has been diagnosed with severe OSA. He was seen by Dr Elberta Fortis and started on amiodarone as a bridge to ablation. He underwent afib and flutter ablation with Dr Elberta Fortis on 09/26/21. Patient unfortunately continued to have afib episodes and underwent repeat afib ablation on 08/15/23.  Patient returns for follow up for atrial fibrillation. He reports that he has done well since the procedure with no interim afib symptoms. He denies any chest pain. His groin sites have healed. No bleeding issues on anticoagulation.   Today, he denies symptoms of palpitations, chest pain, shortness of breath, orthopnea, PND, lower extremity edema, dizziness, presyncope, syncope, bleeding, or neurologic sequela. The patient is tolerating medications without difficulties and is otherwise without complaint today.    Atrial Fibrillation Risk Factors:  he does have symptoms or diagnosis of sleep apnea. he does not have a history of rheumatic fever. he does have a history of alcohol use. The patient does have a history of early familial atrial fibrillation or other arrhythmias. Mother, father, sister have afib.   Atrial Fibrillation Management history:  Previous antiarrhythmic drugs: flecainide,  amiodarone   Previous cardioversions: none Previous ablations: 09/26/21, 08/15/23 Anticoagulation history: Eliquis, Pradaxa    Past Medical History:  Diagnosis Date   Anxiety    Atrial fibrillation (HCC)    Hypertension     Current Outpatient Medications  Medication Sig Dispense Refill   ALPRAZolam (XANAX) 0.5 MG tablet Take 0.5 mg by mouth 3 (three) times daily as needed for anxiety.     apixaban (ELIQUIS) 5 MG TABS tablet Take 1 tablet (5 mg total) by mouth 2 (two) times daily. 180 tablet 0   cetirizine (ZYRTEC) 10 MG tablet Take 10 mg by mouth daily.     lisinopril (ZESTRIL) 10 MG tablet Take 10 mg by mouth daily.     metoprolol succinate (TOPROL-XL) 100 MG 24 hr tablet Take 1 tablet (100 mg total) by mouth 2 (two) times daily. Take with or immediately following a meal. 180 tablet 3   nitroGLYCERIN (NITROSTAT) 0.4 MG SL tablet Place 0.4 mg under the tongue every 5 (five) minutes as needed for chest pain.     pantoprazole (PROTONIX) 40 MG tablet Take 40 mg by mouth 2 (two) times daily before a meal.     rosuvastatin (CRESTOR) 40 MG tablet TAKE 1 TABLET BY MOUTH DAILY 90 tablet 3   colchicine 0.6 MG tablet Take 1 tablet (0.6 mg total) by mouth 2 (two) times daily for 14 days. 28 tablet 0   No current facility-administered medications for this encounter.    ROS- All systems are reviewed and negative except as per the HPI above.  Physical Exam: Vitals:   09/12/23 1135  BP: 124/88  Pulse: 76  Weight: (!) 139 kg  Height:  6' (1.829 m)    GEN: Well nourished, well developed in no acute distress CARDIAC: Regular rate and rhythm, no murmurs, rubs, gallops RESPIRATORY:  Clear to auscultation without rales, wheezing or rhonchi  ABDOMEN: Soft, non-tender, non-distended EXTREMITIES:  No edema; No deformity    Wt Readings from Last 3 Encounters:  09/12/23 (!) 139 kg  08/18/23 131.5 kg  08/15/23 131.5 kg    EKG today demonstrates SR, NST, PVC Vent. rate 76 BPM PR interval 198  ms QRS duration 82 ms QT/QTcB 404/454 ms   Echo 07/06/21 demonstrated   1. Left ventricular ejection fraction, by estimation, is 60 to 65%. The  left ventricle has normal function. The left ventricle has no regional  wall motion abnormalities. Left ventricular diastolic parameters are  consistent with Grade II diastolic dysfunction (pseudonormalization).   2. Right ventricular systolic function is normal. The right ventricular  size is normal.   3. The mitral valve is normal in structure. Trivial mitral valve  regurgitation. No evidence of mitral stenosis.   4. The aortic valve is normal in structure. Aortic valve regurgitation is  not visualized. No aortic stenosis is present.   5. The inferior vena cava is normal in size with greater than 50%  respiratory variability, suggesting right atrial pressure of 3 mmHg.   Epic records are reviewed at length today  CHA2DS2-VASc Score = 1  The patient's score is based upon: CHF History: 0 HTN History: 1 Diabetes History: 0 Stroke History: 0 Vascular Disease History: 0 Age Score: 0 Gender Score: 0       ASSESSMENT AND PLAN: Paroxysmal Atrial Fibrillation/atrial flutter The patient's CHA2DS2-VASc score is 1, indicating a 0.6% annual risk of stroke.   S/p afib and flutter ablation 09/26/21, repeat afib ablation 08/15/23 Patient appears to be maintaining SR. Continue Eliquis 5 mg BID with no missed doses for 3 months post ablation. Continue Toprol 100 mg BID  Obesity Body mass index is 41.56 kg/m.  Encouraged lifestyle modification  OSA  Encouraged nightly CPAP Followed by Dr Tresa Endo  HTN Stable on current regimen   Follow up with Dr Elberta Fortis as scheduled.    Jorja Loa PA-C Afib Clinic [facility] [address] [place], [place] [address] [phone] 09/12/2023 11:40 AM

## 2023-09-17 ENCOUNTER — Encounter: Payer: Self-pay | Admitting: Cardiology

## 2023-09-17 ENCOUNTER — Telehealth: Payer: Self-pay | Admitting: Cardiovascular Disease

## 2023-09-17 NOTE — Telephone Encounter (Signed)
 error

## 2023-09-17 NOTE — Telephone Encounter (Signed)
*  STAT* If patient is at the pharmacy, call can be transferred to refill team.   1. Which medications need to be refilled? (please list name of each medication and dose if known) lisinopril (ZESTRIL) 10 MG tablet    2. Would you like to learn more about the convenience, safety, & potential cost savings by using the [facility]? no   3. Are you open to using the [facility] (Type [facility] no   4. Which pharmacy/location (including street and city if local pharmacy) is medication to be sent to? [facility] - [place], [place] - [address]    5. Do they need a 30 day or 90 day supply? 90 day

## 2023-09-18 MED ORDER — LISINOPRIL 10 MG PO TABS: 10.0000 mg | ORAL_TABLET | Freq: Every day | ORAL | 3 refills | Status: AC

## 2023-09-18 NOTE — Telephone Encounter (Signed)
 Pt's medication was sent to pt's pharmacy as requested. Confirmation received.

## 2023-10-17 LAB — LAB REPORT - SCANNED
A1c: 6.3
EGFR: 87

## 2023-10-29 ENCOUNTER — Other Ambulatory Visit: Payer: Self-pay | Admitting: Cardiology

## 2023-11-10 NOTE — Progress Notes (Unsigned)
  Electrophysiology Office Note:   Date:  11/11/2023  ID:  Joshua Simpson, DOB 05-09-1971, MRN [id number]  Primary Cardiologist: Magnus Schuller, MD Primary Heart Failure: None Electrophysiologist: Martia Dalby [name], MD      History of Present Illness:   Joshua Simpson is a 53 y.o. male with h/o hypertension, sleep apnea, atrial fibrillation/flutter seen today for routine electrophysiology follow-up s/p atrial fibrillation ablation.  Since last being seen in our clinic the patient reports doing well.  He has noted no further episodes of atrial fibrillation since his ablation.  He did present to the emergency room a few days after ablation and was found to have the flu.  Since recovery, he had some PACs but no atrial fibrillation.  he denies chest pain, palpitations, dyspnea, PND, orthopnea, nausea, vomiting, dizziness, syncope, edema, weight gain, or early satiety.    Review of systems complete and found to be negative unless listed in HPI.   EP Information / Studies Reviewed:    EKG is ordered today. Personal review as below.  EKG Interpretation Date/Time:  Monday November 11 2023 10:24:02 EDT Ventricular Rate:  65 PR Interval:  200 QRS Duration:  84 QT Interval:  420 QTC Calculation: 436 R Axis:   53  Text Interpretation: Normal sinus rhythm Normal ECG When compared with ECG of 12-Sep-2023 11:37, Premature ventricular complexes are no longer Present Confirmed by Ailis Rigaud ([id number]) on 11/11/2023 10:30:16 AM     Risk Assessment/Calculations:    CHA2DS2-VASc Score = 1   This indicates a 0.6% annual risk of stroke. The patient's score is based upon: CHF History: 0 HTN History: 1 Diabetes History: 0 Stroke History: 0 Vascular Disease History: 0 Age Score: 0 Gender Score: 0             Physical Exam:   VS:  BP 128/86 (BP Location: Right Arm, Patient Position: Sitting, Cuff Size: Large)   Pulse 65   Ht 6' (1.829 m)   Wt 300 lb (136.1 kg)   SpO2 97%   BMI 40.69 kg/m    Wt  Readings from Last 3 Encounters:  11/11/23 300 lb (136.1 kg)  09/12/23 (!) 306 lb 6.4 oz (139 kg)  08/18/23 289 lb 14.5 oz (131.5 kg)     GEN: Well nourished, well developed in no acute distress NECK: No JVD; No carotid bruits CARDIAC: Regular rate and rhythm, no murmurs, rubs, gallops RESPIRATORY:  Clear to auscultation without rales, wheezing or rhonchi  ABDOMEN: Soft, non-tender, non-distended EXTREMITIES:  No edema; No deformity   ASSESSMENT AND PLAN:    1.  Paroxysmal atrial fibrillation/flutter: Post ablation x 2, most recently 08/15/2023.  He is feeling well.  He has noted no further episodes of atrial fibrillation.  His Eliquis  today.  Yoon Barca reduce metoprolol  to daily.  2.  Obstructive sleep apnea: CPAP compliance encouraged  3.  Obesity: Lifestyle modification encouraged  Follow up with Afib Clinic in 6 months  Signed, Linnie Mcglocklin [name], MD

## 2023-11-11 ENCOUNTER — Encounter: Payer: Self-pay | Admitting: Cardiology

## 2023-11-11 ENCOUNTER — Ambulatory Visit: Payer: BC Managed Care – PPO | Attending: Cardiology | Admitting: Cardiology

## 2023-11-11 ENCOUNTER — Ambulatory Visit: Payer: BC Managed Care – PPO | Admitting: Physician Assistant

## 2023-11-11 VITALS — BP 128/86 | HR 65 | Ht 72.0 in | Wt 300.0 lb

## 2023-11-11 DIAGNOSIS — I480 Paroxysmal atrial fibrillation: Secondary | ICD-10-CM

## 2023-11-11 DIAGNOSIS — R002 Palpitations: Secondary | ICD-10-CM | POA: Diagnosis not present

## 2023-11-11 DIAGNOSIS — I498 Other specified cardiac arrhythmias: Secondary | ICD-10-CM | POA: Diagnosis not present

## 2023-11-11 DIAGNOSIS — I491 Atrial premature depolarization: Secondary | ICD-10-CM

## 2023-11-11 DIAGNOSIS — G4733 Obstructive sleep apnea (adult) (pediatric): Secondary | ICD-10-CM

## 2023-11-11 MED ORDER — METOPROLOL SUCCINATE ER 100 MG PO TB24
100.0000 mg | ORAL_TABLET | Freq: Every day | ORAL | Status: DC
Start: 2023-11-11 — End: 2023-11-11

## 2023-11-11 MED ORDER — METOPROLOL SUCCINATE ER 100 MG PO TB24: 100.0000 mg | ORAL_TABLET | Freq: Every day | ORAL | 3 refills | Status: AC

## 2023-11-11 NOTE — Patient Instructions (Signed)
 Medication Instructions:  Your physician has recommended you make the following change in your medication:  STOP Eliquis  REDUCE Metoprolol  (Toprol ) to 100 mg ONCE daily  *If you need a refill on your cardiac medications before your next appointment, please call your pharmacy*   Lab Work: None ordered   Testing/Procedures: None ordered   Follow-Up: At [facility], you and your health needs are our priority.  As part of our continuing mission to provide you with exceptional heart care, we have created designated Provider Care Teams.  These Care Teams include your primary Cardiologist (physician) and Advanced Practice Providers (APPs -  Physician Assistants and Nurse Practitioners) who all work together to provide you with the care you need, when you need it.  Your next appointment:   6 month(s)  The format for your next appointment:   In Person  Provider:   You will follow up in the Atrial Fibrillation Clinic located at [facility]. Your provider will be: Clint R. Fenton, PA-C or Minnie Amber, PA-C    Thank you for choosing [facility]!!   Reece Cane, RN [phone]

## 2024-01-06 ENCOUNTER — Ambulatory Visit: Payer: Self-pay | Admitting: Cardiovascular Disease

## 2024-01-14 ENCOUNTER — Other Ambulatory Visit: Payer: Self-pay

## 2024-01-14 DIAGNOSIS — R7401 Elevation of levels of liver transaminase levels: Secondary | ICD-10-CM

## 2024-01-14 MED ORDER — ROSUVASTATIN CALCIUM 20 MG PO TABS: 20.0000 mg | ORAL_TABLET | Freq: Every day | ORAL | 3 refills | Status: AC

## 2024-04-29 ENCOUNTER — Other Ambulatory Visit: Payer: Self-pay | Admitting: Cardiology

## 2024-05-13 ENCOUNTER — Ambulatory Visit (HOSPITAL_COMMUNITY)
Admission: RE | Admit: 2024-05-13 | Discharge: 2024-05-13 | Disposition: A | Discharge status: Home / Self Care | Source: Ambulatory Visit | Attending: Physician Assistant | Admitting: Physician Assistant

## 2024-05-13 VITALS — BP 118/82 | HR 69 | Ht 72.0 in | Wt 310.8 lb

## 2024-05-13 DIAGNOSIS — I4891 Unspecified atrial fibrillation: Secondary | ICD-10-CM | POA: Diagnosis not present

## 2024-05-13 DIAGNOSIS — I480 Paroxysmal atrial fibrillation: Secondary | ICD-10-CM | POA: Diagnosis not present

## 2024-05-13 NOTE — Progress Notes (Signed)
 Primary Care Physician: Silvano Angeline FALCON, NP Primary Cardiologist: Dr Burnard Primary Electrophysiologist: Dr Inocencio Referring Physician: [facility]   Joshua Simpson is a 53 y.o. male with a history of HTN, OSA, and atrial fibrillation who presents for follow up in the [facility].  The patient was initially diagnosed with atrial fibrillation 06/07/21 after presenting to his PCP with sinus congestion. ECG showed rapid afib and he was sent to the ED. He was discharged in rate controlled afib. He has a long history of PACs and palpitations. Patient has a CHADS2VASC score of 1. He converted to SR after leaving the ED. He drinks alcohol occasionally but does report snoring and daytime somnolence. He has been diagnosed with severe OSA. He was seen by Dr Inocencio and started on amiodarone  as a bridge to ablation. He underwent afib and flutter ablation with Dr Inocencio on 09/26/21. Patient unfortunately continued to have afib episodes and underwent repeat afib ablation on 08/15/23.  Patient returns for follow up for atrial fibrillation. He is in SR today and feels well. He has not had any interim episodes of afib. He is considering weight loss surgery.   Today, he  denies symptoms of chest pain, shortness of breath, orthopnea, PND, lower extremity edema, dizziness, presyncope, syncope, bleeding, or neurologic sequela. The patient is tolerating medications without difficulties and is otherwise without complaint today.    Atrial Fibrillation Risk Factors:  he does have symptoms or diagnosis of sleep apnea. he does not have a history of rheumatic fever. he does have a history of alcohol use. The patient does have a history of early familial atrial fibrillation or other arrhythmias. Mother, father, sister have afib.   Atrial Fibrillation Management history:  Previous antiarrhythmic drugs: flecainide , amiodarone    Previous cardioversions: none Previous ablations: 09/26/21,  08/15/23 Anticoagulation history: Eliquis , Pradaxa     Past Medical History:  Diagnosis Date   Anxiety    Atrial fibrillation (HCC)    Hypertension     Current Outpatient Medications  Medication Sig Dispense Refill   ALPRAZolam  (XANAX ) 0.5 MG tablet Take 0.5 mg by mouth 3 (three) times daily as needed for anxiety.     cetirizine (ZYRTEC) 10 MG tablet Take 10 mg by mouth daily.     lisinopril  (ZESTRIL ) 10 MG tablet Take 1 tablet (10 mg total) by mouth daily. 90 tablet 3   metoprolol  succinate (TOPROL -XL) 100 MG 24 hr tablet Take 1 tablet (100 mg total) by mouth daily. Take with or immediately following a meal. 90 tablet 3   nitroGLYCERIN  (NITROSTAT ) 0.4 MG SL tablet Place 0.4 mg under the tongue every 5 (five) minutes as needed for chest pain.     pantoprazole  (PROTONIX ) 40 MG tablet Take 40 mg by mouth 2 (two) times daily before a meal.     rosuvastatin  (CRESTOR ) 20 MG tablet Take 1 tablet (20 mg total) by mouth daily. 90 tablet 3   No current facility-administered medications for this encounter.    ROS- All systems are reviewed and negative except as per the HPI above.  Physical Exam: Vitals:   05/13/24 0929  BP: 118/82  Pulse: 69  Weight: (!) 141 kg  Height: 6' (1.829 m)    GEN: Well nourished, well developed in no acute distress CARDIAC: Regular rate and rhythm, no murmurs, rubs, gallops RESPIRATORY:  Clear to auscultation without rales, wheezing or rhonchi  ABDOMEN: Soft, non-tender, non-distended EXTREMITIES:  No edema; No deformity    Wt Readings  from Last 3 Encounters:  05/13/24 (!) 141 kg  11/11/23 136.1 kg  09/12/23 (!) 139 kg    EKG today demonstrates SR, 1st degree AV block Vent. rate 69 BPM PR interval 202 ms QRS duration 82 ms QT/QTcB 428/458 ms   Echo 07/06/21 demonstrated   1. Left ventricular ejection fraction, by estimation, is 60 to 65%. The  left ventricle has normal function. The left ventricle has no regional  wall motion abnormalities.  Left ventricular diastolic parameters are  consistent with Grade II diastolic dysfunction (pseudonormalization).   2. Right ventricular systolic function is normal. The right ventricular  size is normal.   3. The mitral valve is normal in structure. Trivial mitral valve  regurgitation. No evidence of mitral stenosis.   4. The aortic valve is normal in structure. Aortic valve regurgitation is  not visualized. No aortic stenosis is present.   5. The inferior vena cava is normal in size with greater than 50%  respiratory variability, suggesting right atrial pressure of 3 mmHg.   Epic records are reviewed at length today  CHA2DS2-VASc Score = 1  The patient's score is based upon: CHF History: 0 HTN History: 1 Diabetes History: 0 Stroke History: 0 Vascular Disease History: 0 Age Score: 0 Gender Score: 0       ASSESSMENT AND PLAN: Paroxysmal Atrial Fibrillation/atrial flutter (ICD10:  I48.0) The patient's CHA2DS2-VASc score is 1, indicating a 0.6% annual risk of stroke.  S/p afib and flutter ablation 09/26/21, repeat ablation 08/15/23. Patient appears to be maintaining SR Continue Toprol  100 mg daily Not currently on anticoagulation with low CV score.   Obesity Body mass index is 42.15 kg/m.  Encouraged lifestyle modification He is considering weight loss surgery or GLP-1A  OSA  Encouraged nightly CPAP  HTN Stable on current regimen  Follow up in the AF clinic in 6 months.    Daril Kicks PA-C Afib Clinic [facility] [address] [place], [place] [address] [phone] 05/13/2024 9:45 AM

## 2024-05-14 ENCOUNTER — Encounter (HOSPITAL_COMMUNITY): Payer: Self-pay | Admitting: *Deleted

## 2024-05-14 ENCOUNTER — Other Ambulatory Visit (HOSPITAL_COMMUNITY): Payer: Self-pay

## 2024-05-14 ENCOUNTER — Telehealth: Payer: Self-pay | Admitting: Pharmacy Technician

## 2024-05-14 NOTE — Telephone Encounter (Signed)
  No diabetes per chart I tried to do pa for zepbound for osa and came back denied cmm [id number]

## 2024-06-19 ENCOUNTER — Telehealth (HOSPITAL_BASED_OUTPATIENT_CLINIC_OR_DEPARTMENT_OTHER): Payer: Self-pay

## 2024-06-19 NOTE — Telephone Encounter (Signed)
 Called patient to schedule an office appointment for a pre-op clearance on 07/21/24 @ 8:50 with Lum Louis, NP

## 2024-06-19 NOTE — Telephone Encounter (Signed)
   Pre-operative Risk Assessment    Patient Name: Joshua Simpson  DOB: [birth year] MRN: [id number]   Date of last office visit: 11/11/2023 with Dr. Inocencio Date of next office visit: None  Request for Surgical Clearance    Procedure:  Bariatric surgery  Date of Surgery:  Clearance TBD                                 Surgeon:  Camellia Blush, MD Surgeon's Group or Practice Name:  [facility] --Phone number:  [phone] Fax number:  [phone] or [phone] -Jordan Hale, Bariatric navigator   Type of Clearance Requested:   - Medical    Type of Anesthesia:  Does not specify   Additional requests/questions:  None  Signed, Patrcia Iverson CROME   06/19/2024, 3:56 PM

## 2024-06-19 NOTE — Telephone Encounter (Signed)
 Primary Cardiologist:Thomas Burnard, MD (Inactive)  Patient needs appointment with general cardiology MD or APP since has only seen EP and Dr. Burnard has retired.   Chart reviewed as part of pre-operative protocol coverage. Because of Sherrell A Musto's past medical history and time since last visit, he/she will require a follow-up visit in order to better assess preoperative cardiovascular risk.  Pre-op covering staff: - Please schedule appointment and call patient to inform them. - Please contact requesting surgeon's office via preferred method (i.e, phone, fax) to inform them of need for appointment prior to surgery.  Rosaline EMERSON Bane, NP-C  06/19/2024, 4:31 PM [address], [address] [place], [place] [address] Office [phone] Fax [phone]

## 2024-07-13 ENCOUNTER — Encounter: Admitting: Dietician

## 2024-07-14 ENCOUNTER — Encounter: Admitting: Dietician

## 2024-07-14 ENCOUNTER — Encounter: Payer: Self-pay | Admitting: Dietician

## 2024-07-14 VITALS — Ht 72.0 in | Wt 313.3 lb

## 2024-07-14 DIAGNOSIS — I480 Paroxysmal atrial fibrillation: Secondary | ICD-10-CM | POA: Diagnosis present

## 2024-07-14 DIAGNOSIS — G4730 Sleep apnea, unspecified: Secondary | ICD-10-CM | POA: Insufficient documentation

## 2024-07-14 DIAGNOSIS — Z713 Dietary counseling and surveillance: Secondary | ICD-10-CM | POA: Diagnosis present

## 2024-07-14 DIAGNOSIS — E669 Obesity, unspecified: Secondary | ICD-10-CM | POA: Insufficient documentation

## 2024-07-14 NOTE — Progress Notes (Signed)
 Nutrition Assessment for Bariatric Surgery: Pre-Surgery Behavioral and Nutrition Intervention Program   Medical Nutrition Therapy  Appt Start Time: 1103    End Time: 1218  Patient was seen on 07/14/2024 for Pre-Operative Nutrition Assessment. Purpose of todays visit  enhance perioperative outcomes along with a healthy weight maintenance   Referral stated Supervised Weight Loss (SWL) visits needed: 0  Pt completed visits.   Pt has cleared nutrition requirements.   Planned surgery: Gastric By-Pass Pt expectation of surgery: healthier and get off some medications; play with grand daughter a little bit more.  NUTRITION ASSESSMENT   Anthropometrics  Start weight at [facility]: 313.3 lbs (date: 07/14/2024) Height: 72 in BMI: 42.49 kg/m2     Clinical   Pharmacotherapy: History of weight loss medication used: phentermine years ago (would not take it again due to a-fib)  Medical hx: HTN, hypercholesterolemia, obesity, sleep apnea, anxiety Medications: ALPRAZolam  (XANAX ) 0.5 MG tablet cetirizine (ZYRTEC) 10 MG tablet lisinopril  (ZESTRIL ) 10 MG tablet metoprolol  succinate (TOPROL -XL) 100 MG 24 hr tablet nitroGLYCERIN  (NITROSTAT ) 0.4 MG SL tablet pantoprazole  (PROTONIX ) 40 MG tablet rosuvastatin  (CRESTOR ) 20 MG tablet Labs: A1c 6.6, triglycerides 152, glucose 105 Notable signs/symptoms: none noted Any previous deficiencies? No  Evaluation of Nutritional Deficiencies: Micronutrient Nutrition Focused Physical Exam: Hair: No issues observed Eyes: No issues observed Mouth: No issues observed Neck: No issues observed Nails: No issues observed Skin: No issues observed  Lifestyle & Dietary Hx Pt states he has talked to his PCP about anxiety and receives medication (Xanax ) for it.  Pt states he likes non-starchy vegetables, stating he does not have them every day. Pt states he lives with his spouse, stating she is going through the bariatric surgery pathway at [facility]. Pt states his step  daughter and wife had bariatric surgery.  Current Physical Activity Recommendations state 150 minutes per week of moderate to vigorous movement including Cardio and 1-2 days of resistance activities as well as flexibility/balance activities:  Pts current physical activity: ADLs (walk a lot at work, 3,999 steps), with 0% recommendation reached   Sleep Hygiene: duration and quality: uses CPAP machine, stating he does not miss a night with it; good sleep  Current Patient Perceived Stress Level as stated by pt on a scale of 1-10: 3-4       Stress Management Techniques: nothing identified  According to the Dietary Guidelines for Americans Recommendation: equivalent 1.5-2 cups fruits per day, equivalent 2-3 cups vegetables per day and at least half all grains whole  Fruit servings per day (on average): 0-1, meeting 0-50% recommendation  Non-starchy vegetable servings per day (on average): 0-1, meeting 50% recommendation  Whole Grains per day (on average): 0-1  Number of meals missed/skipped per week out of 21: 2  24-Hr Dietary Recall First Meal: mcdonalds breakfast burrito Snack:  Second Meal: pack of tuna (tuna kit) Snack: chips or cookies Third Meal: fast food or 2 days per week they will cook Snack: chips or cookies Beverages: water, soft drink once a week  Alcoholic beverages per week: 2-3 every other weekend   Estimated Energy Needs Calories: 1600  NUTRITION DIAGNOSIS  Overweight/obesity ([place]-3.3) related to past poor dietary habits and physical inactivity as evidenced by patient w/ planned gastric by-pass surgery following dietary guidelines for continued weight loss.  NUTRITION INTERVENTION  Nutrition counseling (C-1) and education (E-2) to facilitate bariatric surgery goals.  Educated pt on micronutrient deficiencies post-surgery and behavioral/dietary strategies to start in order to mitigate that risk   Behavioral and Dietary Interventions  Pre-Op Goals Reviewed with the  Patient Nutrition: Healthy Eating Behaviors Switch to non-caloric, non-carbonated and non-caffeinated beverages such as  water, unsweetened tea, Crystal Light and zero calorie beverages (aim for 64 oz. per day) Cut out grazing between meals or at night  Find a protein shake you like Eat every 3-5 hours        Eliminate distractions while eating (TV, computer, reading, driving, texting) Take 79-69 minutes to eat a meal  Decrease high sugar foods/decrease high fat/fried foods Eliminate alcoholic beverages Increase protein intake (eggs, fish, chicken, yogurt) before surgery; aim for a protein with every meal or snack. Eat non starchy vegetables 2 times a day 7 days a week Eat complex carbohydrates such as whole grains and fruits   Behavioral Modification: Physical Activity Increase my usual daily activity (use stairs, park farther, etc.) Engage in _______________________  activity  _______ minutes ______ times per week  Other:   *Goals that are bolded indicate the pt would like to start working towards these  Handouts Provided Include  Bariatric Surgery handouts (Nutrition Visits, Pre Surgery Behavioral Change Goals, Protein Shakes Brands to Choose From, Vitamins & Mineral Supplementation)  Learning Style & Readiness for Change Teaching method utilized: Visual, Auditory, and hands on  Demonstrated degree of understanding via: Teach Back  Readiness Level: preparation Barriers to learning/adherence to lifestyle change: nothing identified  RD's Notes for Next Visit    MONITORING & EVALUATION Dietary intake, weekly physical activity, body weight, and preoperative behavioral change goals   Next Steps  Pt has completed visits. No further supervised visits required/recommended. Patient is to follow up at [facility] for pre-op class >2 weeks prior to scheduled surgery.

## 2024-07-21 ENCOUNTER — Ambulatory Visit: Admitting: Emergency Medicine

## 2024-07-27 ENCOUNTER — Ambulatory Visit: Admitting: Emergency Medicine

## 2024-07-27 ENCOUNTER — Encounter: Payer: Self-pay | Admitting: Emergency Medicine

## 2024-07-27 VITALS — BP 130/78 | HR 84 | Ht 72.0 in | Wt 319.0 lb

## 2024-07-27 DIAGNOSIS — E119 Type 2 diabetes mellitus without complications: Secondary | ICD-10-CM | POA: Diagnosis not present

## 2024-07-27 DIAGNOSIS — E785 Hyperlipidemia, unspecified: Secondary | ICD-10-CM | POA: Diagnosis not present

## 2024-07-27 DIAGNOSIS — G4733 Obstructive sleep apnea (adult) (pediatric): Secondary | ICD-10-CM | POA: Diagnosis not present

## 2024-07-27 DIAGNOSIS — I480 Paroxysmal atrial fibrillation: Secondary | ICD-10-CM | POA: Diagnosis not present

## 2024-07-27 DIAGNOSIS — Z01810 Encounter for preprocedural cardiovascular examination: Secondary | ICD-10-CM | POA: Diagnosis not present

## 2024-07-27 NOTE — Patient Instructions (Addendum)
 Medication Instructions:  NO CHANGES  Lab Work: NONE TO BE DONE TODAY.  Testing/Procedures: NONE  Follow-Up: At [facility], you and your health needs are our priority.  As part of our continuing mission to provide you with exceptional heart care, our providers are all part of one team.  This team includes your primary Cardiologist (physician) and Advanced Practice Providers or APPs (Physician Assistants and Nurse Practitioners) who all work together to provide you with the care you need, when you need it.  Your next appointment:   4 MONTHS TO ESTABLISH CARE WITH DR. SULLIVAN (FORMER DR. KELLY PATIENT)  Provider:   GEORGANNA ARCHER, MD   We recommend signing up for the patient portal called MyChart.  Sign up information is provided on this After Visit Summary.  MyChart is used to connect with patients for Virtual Visits (Telemedicine).  Patients are able to view lab/test results, encounter notes, upcoming appointments, etc.  Non-urgent messages can be sent to your provider as well.   To learn more about what you can do with MyChart, go to [email or web address].   Other Instructions:

## 2024-07-27 NOTE — Progress Notes (Signed)
 " Cardiology Office Note:    Date:  07/27/2024  ID:  Joshua Simpson, DOB March 10, 1971, MRN [id number] PCP: Silvano Angeline FALCON, NP  [facility] Providers Cardiologist:  None Electrophysiologist:  Will Gladis Norton, MD       Patient Profile:       Chief Complaint: Preoperative clearance History of Present Illness:  Joshua Simpson is a 54 y.o. male with visit-pertinent history of hypertension, obesity, paroxysmal atrial fibrillation s/p ablation on 09/26/2021, OSA on CPAP  Echocardiogram in March 2017 showed LVEF 60 to 65%, no RWMA, grade 2 DD, trivial MR, dilated aortic root at 37 mm.  Exercise tolerance test in June 2018 was negative for ischemia.  He underwent cardiac catheterization on 05/2018 that showed EF 52%, LVEDP elevated at 22 mmHg, and normal coronary arteries.  He has had several cardiac monitors in February 2019, January 2023 and again in February 2024.  He was evaluated in the atrial fibrillation clinic in November 2022 after he developed atrial fibrillation.  Echocardiogram in December 2022 showed LVEF 60 to 65%, grade 2 DD.  Previous heart monitor in January 2023 showed 21% atrial fibrillation/atrial flutter ablation.  Sleep study in January 2023 showed severe obstructive sleep apnea with AHI of 38.7 and severe oxygen desaturation of 80%.  He was started on Eliquis  and amiodarone .  He underwent successful A-fib ablation by Dr. Norton on 09/26/2021 for PAF and atypical appearing atrial flutter.  He unfortunately continued to have atrial fibrillation episodes and underwent repeat A-fib ablation on 08/15/2023.  He was last evaluated in the atrial fibrillation clinic on 05/13/2024.  He was maintaining sinus rhythm.  He was continued on metoprolol  100 mg daily.  He is not currently on anticoagulation with low CV score.  Discussed the use of AI scribe software for clinical note transcription with the patient, who gave verbal consent to proceed.  History of Present Illness Joshua Simpson  is a 54 year old male with atrial fibrillation and hypertension who presents for clearance for bariatric surgery.  He is seeking cardiovascular clearance for Roux-en-Y gastric bypass.  Today he is without acute cardiovascular concerns or complaints.  Notes his recent A1c is 6.6, the highest to date.  He has had no significant Afib symptoms.  He denies any reoccurrence of his prior A-fib symptoms.  He is not on anticoagulation. He takes metoprolol  100 mg daily. He monitors his rhythm with a smartwatch and notes occasional symptomatic PACs as skipped or extra beats.  He has obstructive sleep apnea treated with nightly CPAP, which improves his symptoms. His BMI is 43. He has tried multiple diet and medication-based weight-loss strategies in the past. He is interested in injectable therapy for weight and diabetes but has had insurance barriers. He denies chest pain or shortness of breath.  He denies chest pains, shortness of breath, orthopnea, PND, LEE, palpitations, syncope, presyncope, lightheadedness, dizziness   Review of systems:  Please see the history of present illness. All other systems are reviewed and otherwise negative.      Studies Reviewed:    EKG Interpretation Date/Time:  Monday July 27 2024 13:53:26 EST Ventricular Rate:  84 PR Interval:  192 QRS Duration:  80 QT Interval:  378 QTC Calculation: 446 R Axis:   11  Text Interpretation: Normal sinus rhythm Nonspecific ST abnormality When compared with ECG of 13-May-2024 09:36, No significant change was found Confirmed by Rana Dixon [phone]) on 07/27/2024 4:25:52 PM    ZIO 07/24/2022 Patch Wear Time:  13 days and 17 hours    Prominent rhythm was sinus rhythm Less than 1% ventricular and supraventricular ectopy burden Triggered episodes associated with sinus rhythm   Echocardiogram 07/06/2021 1. Left ventricular ejection fraction, by estimation, is 60 to 65%. The  left ventricle has normal function. The left  ventricle has no regional  wall motion abnormalities. Left ventricular diastolic parameters are  consistent with Grade II diastolic  dysfunction (pseudonormalization).   2. Right ventricular systolic function is normal. The right ventricular  size is normal.   3. The mitral valve is normal in structure. Trivial mitral valve  regurgitation. No evidence of mitral stenosis.   4. The aortic valve is normal in structure. Aortic valve regurgitation is  not visualized. No aortic stenosis is present.   5. The inferior vena cava is normal in size with greater than 50%  respiratory variability, suggesting right atrial pressure of 3 mmHg.   Cardiac catheterization 05/26/2018 LV end diastolic pressure is mildly elevated.   Low normal LV function with an ejection fraction estimated 50%.  LVEDP is increased at 22 mm.  There are no focal segmental wall motion abnormalities.   Normal epicardial coronary arteries.   RECOMMENDATION: Catheterization does not identify any coronary obstructive disease.  If he continues to experience exertional chest pressure consider cardiopulmonary met testing to potentially assess for microvascular angina.  LV function is low normal with increased EDP which may account for some of his exertional dyspnea.  Consider outpatient echo Doppler study to re-assess LV systolic and diastolic function.   No indication for antiplatelet therapy at this time. Diagnostic Dominance: Right  Risk Assessment/Calculations:    CHA2DS2-VASc Score = 1   This indicates a 0.6% annual risk of stroke. The patient's score is based upon: CHF History: 0 HTN History: 1 Diabetes History: 0 Stroke History: 0 Vascular Disease History: 0 Age Score: 0 Gender Score: 0              Physical Exam:   VS:  BP 130/78 (BP Location: Left Arm, Patient Position: Sitting, Cuff Size: Large)   Pulse 84   Ht 6' (1.829 m)   Wt (!) 319 lb (144.7 kg)   BMI 43.26 kg/m    Wt Readings from Last 3  Encounters:  07/27/24 (!) 319 lb (144.7 kg)  07/14/24 (!) 313 lb 4.8 oz (142.1 kg)  05/13/24 (!) 310 lb 12.8 oz (141 kg)    GEN: Well nourished, well developed in no acute distress NECK: No JVD; No carotid bruits CARDIAC: RRR, no murmurs, rubs, gallops RESPIRATORY:  Clear to auscultation without rales, wheezing or rhonchi  ABDOMEN: Soft, non-tender, non-distended EXTREMITIES:  No edema; No acute deformity      Assessment and Plan:  Paroxysmal atrial fibrillation S/p A-fib and flutter ablation on 09/2021 and repeat ablation on 07/2023 - Today patient is maintaining sinus rhythm on EKG - He is without symptoms concerning for recurrent atrial fibrillation - Continue metoprolol  succinate 100 mg daily - Not currently on anticoagulation with CHA2DS2-VASc score of 1 indicating a 0.6% annual risk of stroke  Obstructive sleep apnea - He remains adherent to nightly CPAP therapy  Type 2 diabetes A1c 6.6% on 06/2024 - New diagnosis based on recent labs and not currently treated with medication and has yet to follow-up with PCP - He will schedule appointment with PCP to discuss diagnosis later this month  Hypertension Blood pressure today is well-controlled at 130/78 - Continue metoprolol  succinate 100 mg daily and lisinopril  10 mg  daily  Obesity BMI 43.26 - Encouraged lifestyle modification with daily physical exercise and heart healthy dieting - He is pending gastric bypass surgery - He is also considering GLP-1 therapy but has been denied by insurance in the past.  He plans to talk to his PCP with his new diagnosis of T2DM based on most recent A1c to see if he is a candidate for GLP-1's.  I discussed with him that we can refer to our Pharm.D. in the future if needed  Hyperlipidemia, LDL goal <100 CAC score of 0 on 07/2023 LDL 80 on 06/2024 - Continue rosuvastatin  20 mg daily  Preoperative cardiovascular clearance According to the Revised Cardiac Risk Index (RCRI), his Perioperative  Risk of Major Cardiac Event is (%): 0.4. His Functional Capacity in METs is: 7.25 according to the [facility] Activity Status Index (DASI).  Therefore, based on ACC/AHA guidelines, patient would be at acceptable risk for the planned procedure without further cardiovascular testing. I will route this recommendation to the requesting party via Epic fax function.       Dispo: Return in 4 to 5 months to establish with Dr. Floretta.  Signed, Lum LITTIE Louis, NP  "

## 2024-07-28 ENCOUNTER — Telehealth: Payer: Self-pay | Admitting: Cardiology

## 2024-07-28 NOTE — Telephone Encounter (Signed)
 Pt informed that he will need to call the ordering provider with [facility] and ask them to fax the results, as we did not order this blood work/draw it.  Pt agreeable to plan.  Aware I will send to Lum Louis, NP for approval to place referral to see pharmD for possible [place]o start secondary to diabetes

## 2024-07-28 NOTE — Telephone Encounter (Signed)
 Pt requesting we send blood work results to primary. Fax: [phone]

## 2024-11-11 ENCOUNTER — Ambulatory Visit (HOSPITAL_COMMUNITY): Admitting: Physician Assistant

## 2024-11-24 ENCOUNTER — Ambulatory Visit: Admitting: Student in an Organized Health Care Education/Training Program
